# Patient Record
Sex: Male | Born: 1950
Health system: Southern US, Community
[De-identification: ages and names within clinical notes are randomized; demographics above are authoritative.]

## PROBLEM LIST (undated history)

## (undated) DIAGNOSIS — K635 Polyp of colon: Secondary | ICD-10-CM

## (undated) DIAGNOSIS — G4700 Insomnia, unspecified: Secondary | ICD-10-CM

## (undated) DIAGNOSIS — I2510 Atherosclerotic heart disease of native coronary artery without angina pectoris: Secondary | ICD-10-CM

## (undated) DIAGNOSIS — E119 Type 2 diabetes mellitus without complications: Secondary | ICD-10-CM

## (undated) DIAGNOSIS — R972 Elevated prostate specific antigen [PSA]: Secondary | ICD-10-CM

## (undated) DIAGNOSIS — J45909 Unspecified asthma, uncomplicated: Secondary | ICD-10-CM

## (undated) DIAGNOSIS — I10 Essential (primary) hypertension: Secondary | ICD-10-CM

## (undated) DIAGNOSIS — F17200 Nicotine dependence, unspecified, uncomplicated: Secondary | ICD-10-CM

## (undated) DIAGNOSIS — N259 Disorder resulting from impaired renal tubular function, unspecified: Secondary | ICD-10-CM

## (undated) DIAGNOSIS — Z9289 Personal history of other medical treatment: Secondary | ICD-10-CM

## (undated) DIAGNOSIS — I6529 Occlusion and stenosis of unspecified carotid artery: Secondary | ICD-10-CM

## (undated) DIAGNOSIS — H9319 Tinnitus, unspecified ear: Secondary | ICD-10-CM

## (undated) DIAGNOSIS — K219 Gastro-esophageal reflux disease without esophagitis: Secondary | ICD-10-CM

## (undated) DIAGNOSIS — E7800 Pure hypercholesterolemia, unspecified: Secondary | ICD-10-CM

## (undated) DIAGNOSIS — Z973 Presence of spectacles and contact lenses: Secondary | ICD-10-CM

## (undated) DIAGNOSIS — K5790 Diverticulosis of intestine, part unspecified, without perforation or abscess without bleeding: Secondary | ICD-10-CM

## (undated) HISTORY — DX: Polyp of colon: K63.5

## (undated) HISTORY — PX: CATARACT EXTRACTION W/ INTRAOCULAR LENS  IMPLANT, BILATERAL: SHX1307

## (undated) HISTORY — DX: Essential (primary) hypertension: I10

## (undated) HISTORY — DX: Occlusion and stenosis of unspecified carotid artery: I65.29

## (undated) HISTORY — PX: HERNIA REPAIR: SHX51

## (undated) HISTORY — DX: Personal history of other medical treatment: Z92.89

## (undated) HISTORY — DX: Disorder resulting from impaired renal tubular function, unspecified: N25.9

## (undated) HISTORY — DX: Diverticulosis of intestine, part unspecified, without perforation or abscess without bleeding: K57.90

## (undated) HISTORY — DX: Insomnia, unspecified: G47.00

## (undated) HISTORY — DX: Pure hypercholesterolemia, unspecified: E78.00

## (undated) HISTORY — DX: Type 2 diabetes mellitus without complications: E11.9

## (undated) HISTORY — PX: POLYPECTOMY: SHX149

## (undated) HISTORY — PX: COLONOSCOPY: SHX174

## (undated) HISTORY — PX: TONSILLECTOMY: SUR1361

## (undated) HISTORY — DX: Elevated prostate specific antigen (PSA): R97.20

## (undated) HISTORY — DX: Nicotine dependence, unspecified, uncomplicated: F17.200

---

## 2001-09-20 ENCOUNTER — Ambulatory Visit (HOSPITAL_COMMUNITY): Admission: RE | Admit: 2001-09-20 | Discharge: 2001-09-20 | Payer: Self-pay | Admitting: *Deleted

## 2002-12-12 ENCOUNTER — Encounter: Payer: Self-pay | Admitting: Gastroenterology

## 2003-09-15 ENCOUNTER — Emergency Department (HOSPITAL_COMMUNITY): Admission: EM | Admit: 2003-09-15 | Discharge: 2003-09-15 | Payer: Self-pay | Admitting: Emergency Medicine

## 2004-04-07 ENCOUNTER — Ambulatory Visit (HOSPITAL_COMMUNITY): Admission: RE | Admit: 2004-04-07 | Discharge: 2004-04-07 | Payer: Self-pay | Admitting: General Surgery

## 2004-04-07 ENCOUNTER — Encounter (INDEPENDENT_AMBULATORY_CARE_PROVIDER_SITE_OTHER): Payer: Self-pay | Admitting: *Deleted

## 2004-06-17 ENCOUNTER — Ambulatory Visit: Payer: Self-pay | Admitting: Endocrinology

## 2004-06-20 ENCOUNTER — Ambulatory Visit: Payer: Self-pay | Admitting: Endocrinology

## 2004-07-25 ENCOUNTER — Ambulatory Visit: Payer: Self-pay | Admitting: Endocrinology

## 2004-08-01 ENCOUNTER — Ambulatory Visit: Payer: Self-pay | Admitting: Endocrinology

## 2005-09-27 ENCOUNTER — Ambulatory Visit: Payer: Self-pay | Admitting: Endocrinology

## 2005-10-30 ENCOUNTER — Ambulatory Visit: Payer: Self-pay | Admitting: Endocrinology

## 2006-04-25 ENCOUNTER — Inpatient Hospital Stay (HOSPITAL_COMMUNITY): Admission: EM | Admit: 2006-04-25 | Discharge: 2006-04-27 | Payer: Self-pay | Admitting: Emergency Medicine

## 2006-04-26 ENCOUNTER — Ambulatory Visit: Payer: Self-pay | Admitting: Internal Medicine

## 2006-04-28 ENCOUNTER — Inpatient Hospital Stay (HOSPITAL_COMMUNITY): Admission: EM | Admit: 2006-04-28 | Discharge: 2006-05-07 | Payer: Self-pay | Admitting: Emergency Medicine

## 2006-04-29 ENCOUNTER — Encounter (INDEPENDENT_AMBULATORY_CARE_PROVIDER_SITE_OTHER): Payer: Self-pay | Admitting: Specialist

## 2006-05-01 ENCOUNTER — Ambulatory Visit: Payer: Self-pay | Admitting: Internal Medicine

## 2006-05-25 ENCOUNTER — Ambulatory Visit: Payer: Self-pay | Admitting: Endocrinology

## 2006-08-24 ENCOUNTER — Encounter: Payer: Self-pay | Admitting: Endocrinology

## 2006-08-24 DIAGNOSIS — I10 Essential (primary) hypertension: Secondary | ICD-10-CM

## 2006-09-24 ENCOUNTER — Ambulatory Visit: Payer: Self-pay | Admitting: Endocrinology

## 2006-09-24 LAB — CONVERTED CEMR LAB
ALT: 21 units/L (ref 0–53)
AST: 20 units/L (ref 0–37)
Basophils Relative: 0.1 % (ref 0.0–1.0)
Bilirubin, Direct: 0.1 mg/dL (ref 0.0–0.3)
CO2: 24 meq/L (ref 19–32)
Calcium: 9.2 mg/dL (ref 8.4–10.5)
Chloride: 110 meq/L (ref 96–112)
Eosinophils Absolute: 0.3 10*3/uL (ref 0.0–0.6)
Eosinophils Relative: 3.3 % (ref 0.0–5.0)
GFR calc non Af Amer: 44 mL/min
Glucose, Bld: 106 mg/dL — ABNORMAL HIGH (ref 70–99)
Hgb A1c MFr Bld: 6.7 % — ABNORMAL HIGH (ref 4.6–6.0)
Ketones, ur: NEGATIVE mg/dL
LDL Cholesterol: 120 mg/dL — ABNORMAL HIGH (ref 0–99)
Nitrite: NEGATIVE
PTH: 29.1 pg/mL (ref 14.0–72.0)
Platelets: 205 10*3/uL (ref 150–400)
RBC: 5.36 M/uL (ref 4.22–5.81)
RDW: 13.2 % (ref 11.5–14.6)
Specific Gravity, Urine: >=1.03 (ref 1.000–1.03)
TSH: 1.79 microintl units/mL (ref 0.35–5.50)
Total CHOL/HDL Ratio: 3.7
Urine Glucose: NEGATIVE mg/dL
WBC: 8.8 10*3/uL (ref 4.5–10.5)
pH: 6 (ref 5.0–8.0)

## 2006-09-25 ENCOUNTER — Ambulatory Visit: Payer: Self-pay | Admitting: Endocrinology

## 2006-09-25 DIAGNOSIS — N259 Disorder resulting from impaired renal tubular function, unspecified: Secondary | ICD-10-CM | POA: Insufficient documentation

## 2006-09-25 DIAGNOSIS — E875 Hyperkalemia: Secondary | ICD-10-CM

## 2006-09-25 DIAGNOSIS — E119 Type 2 diabetes mellitus without complications: Secondary | ICD-10-CM

## 2006-09-25 HISTORY — DX: Disorder resulting from impaired renal tubular function, unspecified: N25.9

## 2006-09-25 HISTORY — DX: Type 2 diabetes mellitus without complications: E11.9

## 2007-01-22 ENCOUNTER — Ambulatory Visit: Payer: Self-pay | Admitting: Endocrinology

## 2007-01-22 DIAGNOSIS — E7800 Pure hypercholesterolemia, unspecified: Secondary | ICD-10-CM | POA: Insufficient documentation

## 2007-01-22 LAB — CONVERTED CEMR LAB
ALT: 24 units/L (ref 0–53)
AST: 24 units/L (ref 0–37)
Albumin: 4 g/dL (ref 3.5–5.2)
Alkaline Phosphatase: 83 units/L (ref 39–117)
BUN: 20 mg/dL (ref 6–23)
Calcium: 9.4 mg/dL (ref 8.4–10.5)
Chloride: 108 meq/L (ref 96–112)
Creatinine, Ser: 1.2 mg/dL (ref 0.4–1.5)
HDL: 49.4 mg/dL (ref >39.0)
Sodium: 142 meq/L (ref 135–145)
Total Bilirubin: 0.8 mg/dL (ref 0.3–1.2)
VLDL: 22 mg/dL (ref 0–40)

## 2007-01-23 ENCOUNTER — Ambulatory Visit: Payer: Self-pay | Admitting: Endocrinology

## 2007-01-23 LAB — CONVERTED CEMR LAB
ALT: 24 units/L (ref 0–53)
Alkaline Phosphatase: 86 units/L (ref 39–117)
BUN: 19 mg/dL (ref 6–23)
Bilirubin, Direct: 0.1 mg/dL (ref 0.0–0.3)
Calcium: 9.4 mg/dL (ref 8.4–10.5)
GFR calc Af Amer: 73 mL/min
GFR calc non Af Amer: 60 mL/min
LDL Cholesterol: 119 mg/dL — ABNORMAL HIGH (ref 0–99)
Potassium: 4.2 meq/L (ref 3.5–5.1)
Total CHOL/HDL Ratio: 3.6

## 2007-02-18 ENCOUNTER — Ambulatory Visit: Payer: Self-pay | Admitting: Gastroenterology

## 2007-02-28 ENCOUNTER — Encounter: Payer: Self-pay | Admitting: Gastroenterology

## 2007-02-28 ENCOUNTER — Ambulatory Visit: Payer: Self-pay | Admitting: Gastroenterology

## 2007-03-04 ENCOUNTER — Inpatient Hospital Stay (HOSPITAL_COMMUNITY): Admission: RE | Admit: 2007-03-04 | Discharge: 2007-03-10 | Payer: Self-pay | Admitting: General Surgery

## 2007-04-29 ENCOUNTER — Encounter: Payer: Self-pay | Admitting: Endocrinology

## 2008-05-17 ENCOUNTER — Emergency Department (HOSPITAL_COMMUNITY): Admission: EM | Admit: 2008-05-17 | Discharge: 2008-05-17 | Payer: Self-pay | Admitting: Emergency Medicine

## 2008-05-18 ENCOUNTER — Ambulatory Visit: Payer: Self-pay | Admitting: Endocrinology

## 2008-05-18 DIAGNOSIS — G8190 Hemiplegia, unspecified affecting unspecified side: Secondary | ICD-10-CM | POA: Insufficient documentation

## 2008-05-20 ENCOUNTER — Encounter: Payer: Self-pay | Admitting: Endocrinology

## 2008-05-20 ENCOUNTER — Ambulatory Visit: Payer: Self-pay

## 2008-05-21 LAB — CONVERTED CEMR LAB
ALT: 23 units/L (ref 0–53)
Alkaline Phosphatase: 114 units/L (ref 39–117)
Bilirubin, Direct: 0.2 mg/dL (ref 0.0–0.3)
Total Bilirubin: 0.9 mg/dL (ref 0.3–1.2)
Total Protein: 7.2 g/dL (ref 6.0–8.3)

## 2008-06-02 ENCOUNTER — Ambulatory Visit: Payer: Self-pay | Admitting: Endocrinology

## 2008-06-02 LAB — CONVERTED CEMR LAB
AST: 29 units/L (ref 0–37)
CO2: 29 meq/L (ref 19–32)
Chloride: 108 meq/L (ref 96–112)
Creatinine, Ser: 1.7 mg/dL — ABNORMAL HIGH (ref 0.4–1.5)
HDL: 34.6 mg/dL — ABNORMAL LOW (ref >39.00)
Hgb A1c MFr Bld: 6.1 % (ref 4.6–6.5)
LDL Cholesterol: 84 mg/dL (ref 0–99)
Potassium: 4.5 meq/L (ref 3.5–5.1)
Total Bilirubin: 0.7 mg/dL (ref 0.3–1.2)
Total CHOL/HDL Ratio: 4

## 2008-09-04 ENCOUNTER — Ambulatory Visit (HOSPITAL_COMMUNITY): Admission: EM | Admit: 2008-09-04 | Discharge: 2008-09-04 | Payer: Self-pay | Admitting: Emergency Medicine

## 2008-09-10 ENCOUNTER — Ambulatory Visit: Payer: Self-pay | Admitting: Endocrinology

## 2008-09-10 DIAGNOSIS — G4700 Insomnia, unspecified: Secondary | ICD-10-CM

## 2008-09-10 HISTORY — DX: Insomnia, unspecified: G47.00

## 2009-11-22 ENCOUNTER — Ambulatory Visit: Payer: Self-pay | Admitting: Endocrinology

## 2009-11-22 LAB — CONVERTED CEMR LAB
ALT: 22 units/L (ref 0–53)
Basophils Absolute: 0.1 10*3/uL (ref 0.0–0.1)
Bilirubin Urine: NEGATIVE
CO2: 25 meq/L (ref 19–32)
Calcium: 8.9 mg/dL (ref 8.4–10.5)
Cholesterol: 236 mg/dL — ABNORMAL HIGH (ref 0–200)
Creatinine, Ser: 1.3 mg/dL (ref 0.4–1.5)
GFR calc non Af Amer: 73.96 mL/min (ref >60)
HDL: 46.6 mg/dL (ref >39.00)
Hemoglobin: 15.5 g/dL (ref 13.0–17.0)
Leukocytes, UA: NEGATIVE
Lymphocytes Relative: 18.9 % (ref 12.0–46.0)
Lymphs Abs: 1.7 10*3/uL (ref 0.7–4.0)
Microalb, Ur: 25 mg/dL — ABNORMAL HIGH (ref 0.0–1.9)
Monocytes Absolute: 1 10*3/uL (ref 0.1–1.0)
Monocytes Relative: 11.8 % (ref 3.0–12.0)
Neutrophils Relative %: 63.4 % (ref 43.0–77.0)
Nitrite: NEGATIVE
RBC: 5.45 M/uL (ref 4.22–5.81)
Sodium: 137 meq/L (ref 135–145)
TSH: 1.77 microintl units/mL (ref 0.35–5.50)
Total Bilirubin: 0.6 mg/dL (ref 0.3–1.2)
Total CHOL/HDL Ratio: 5
Triglycerides: 120 mg/dL (ref 0.0–149.0)
VLDL: 24 mg/dL (ref 0.0–40.0)
WBC: 8.8 10*3/uL (ref 4.5–10.5)
pH: 5.5 (ref 5.0–8.0)

## 2009-11-30 ENCOUNTER — Encounter: Payer: Self-pay | Admitting: Endocrinology

## 2009-11-30 ENCOUNTER — Ambulatory Visit: Payer: Self-pay | Admitting: Endocrinology

## 2009-11-30 DIAGNOSIS — R05 Cough: Secondary | ICD-10-CM

## 2009-11-30 DIAGNOSIS — R972 Elevated prostate specific antigen [PSA]: Secondary | ICD-10-CM | POA: Insufficient documentation

## 2009-11-30 DIAGNOSIS — F17200 Nicotine dependence, unspecified, uncomplicated: Secondary | ICD-10-CM

## 2009-11-30 HISTORY — DX: Nicotine dependence, unspecified, uncomplicated: F17.200

## 2009-11-30 HISTORY — DX: Elevated prostate specific antigen (PSA): R97.20

## 2010-01-04 ENCOUNTER — Telehealth (INDEPENDENT_AMBULATORY_CARE_PROVIDER_SITE_OTHER): Payer: Self-pay | Admitting: *Deleted

## 2010-02-02 ENCOUNTER — Encounter: Payer: Self-pay | Admitting: Gastroenterology

## 2010-02-08 NOTE — Procedures (Signed)
Summary: Colonoscopy   Colonoscopy  Procedure date:  02/28/2007  Findings:      Results: Hemorrhoids.     Results: Diverticulosis.       Pathology:  Adenomatous polyp.        Location:  [facility].    Procedures Next Due Date:    Colonoscopy: 03/2010 Patient Name: Shane [name], Shane [name] MRN: [id number] Procedure Procedures: Colonoscopy CPT: 54098.    with polypectomy. CPT: A3573898.    with inj. sclerosis of hemorrhoids, CPT: 46500  Personnel: Endoscopist: Venita Lick. Russella Dar, MD, Clementeen Graham.  Exam Location: Exam performed in Outpatient Clinic. Outpatient  Patient Consent: Procedure, Alternatives, Risks and Benefits discussed, consent obtained, from patient. Consent was obtained by the RN.  Indications Symptoms: Hematochezia.  History  Current Medications: Patient is not currently taking Coumadin.  Pre-Exam Physical: Performed Feb 28, 2007. Cardio-pulmonary exam, Rectal exam, HEENT exam  WNL. Abdominal exam abnormal. Mental status exam WNL. Abnormal PE findings include: ventral hernia.  Comments: Pt. history reviewed/updated, physical exam performed prior to initiation of sedation?Yes Exam Exam: Extent of exam reached: Cecum, extent intended: Cecum.  The cecum was identified by appendiceal orifice and IC valve. Time to Cecum: 00:02: 21. Time for Withdrawl: 00:16:58. Colon retroflexion performed. ASA Classification: II. Tolerance: excellent.  Monitoring: Pulse and BP monitoring, Oximetry used. Supplemental O2 given.  Colon Prep Used MoviPrep for colon prep. Prep results: excellent.  Sedation Meds: Patient assessed and found to be appropriate for moderate (conscious) sedation. Fentanyl 75 mcg. given IV. Versed 8 mg. given IV.  Findings NORMAL EXAM: Splenic Flexure.  POLYP: Transverse Colon, Maximum size: 5 mm. sessile polyp. Procedure:  snare without cautery, removed, retrieved, Polyp sent to pathology. ICD9: Colon Polyps: 211.3.  NORMAL EXAM: Ascending Colon to  Hepatic Flexure.  - DIVERTICULOSIS: Descending Colon to Sigmoid Colon. Not bleeding. ICD9: Diverticulosis: 562.10.  POLYP: Descending Colon, Maximum size: 8 mm. pedunculated polyp. Procedure:  snare with cautery, removed, retrieved, sent to pathology. ICD9: Colon Polyps: 211.3.  MULTIPLE POLYPS: Descending Colon to Sigmoid Colon. minimum size 4 mm, maximum size 5 mm. Procedure:  snare without cautery, removed, retrieved, 4 polyps Polyps sent to pathology. ICD9: Colon Polyps: 211.3.  POLYP: Cecum, Maximum size: 5 mm. pedunculated polyp. Procedure:  snare without cautery, removed, retrieved, Polyp sent to pathology. ICD9: Colon Polyps: 211.3.  HEMORRHOIDS: Internal. Size: Medium. Not bleeding. Not thrombosed. ICD9: Hemorrhoids, Internal: 455.0.  - Injection: Rectum. Injected with 23.4% saline, 3 ccs. Outcome: successful.   Assessment  Diagnoses: 211.3: Colon Polyps.  562.10: Diverticulosis.  455.0: Hemorrhoids, Internal.   Events  Unplanned Interventions: No intervention was required.  Unplanned Events: There were no complications. Plans  Post Exam Instructions: Post sedation instructions given. No aspirin or non-steroidal containing medications: 2 weeks.  Medication Plan: Await pathology.  Patient Education: Patient given standard instructions for: Polyps. Diverticulosis. Hemorrhoids.  Disposition: After procedure patient sent to recovery. After recovery patient sent home.  Scheduling/Referral: Colonoscopy, to [facility] T. Russella Dar, MD, [facility], if polyps adenomatous, around Feb 27, 2010.    This report was created from the original endoscopy report, which was reviewed and signed by the above listed endoscopist.    cc: Angelia Mould. Derrell Lolling, MD     Romero Belling, MD      Appended Document: Colonoscopy    Clinical Lists Changes  Observations: Added new observation of COLONNXTDUE: 02/2010 (11/30/2009 9:40)

## 2010-02-08 NOTE — Assessment & Plan Note (Signed)
Summary: CPX/ NWS  #   Vital Signs:  Patient profile:   60 year old male Height:      71 inches (180.34 cm) Weight:      241.13 pounds (109.60 kg) BMI:     33.75 O2 Sat:      99 % on Room air Temp:     98.2 degrees F (36.78 degrees C) oral Pulse rate:   92 / minute BP sitting:   132 / 84  (left arm) Cuff size:   large  Vitals Entered By: Brenton Grills CMA Duncan Dull) (November 30, 2009 8:40 AM)  O2 Flow:  Room air CC: Physical/aj Is Patient Diabetic? Yes   CC:  Physical/aj.  History of Present Illness: here for regular wellness examination.  He's feeling pretty well in general, and does not drink or smoke.   Current Medications (verified): 1)  Prilosec Otc 20 Mg Tbec (Omeprazole Magnesium) .[name] Kitchen.. 1 Daily 2)  Aspirin Adult Low Strength 81 Mg Tbec (Aspirin) .[name] Kitchen.. 1 Daily 3)  Hydrochlorothiazide 12.5 Mg Tabs (Hydrochlorothiazide) .[name] Kitchen.. 1 Qd 4)  Simvastatin 80 Mg Tabs (Simvastatin) .[name] Kitchen.. 1 Qhs 5)  Trazodone Hcl 150 Mg Tabs (Trazodone Hcl) .[name] Kitchen.. 1 Qhs 6)  Garlic 500 Mg Caps (Garlic) .[name] Kitchen.. 1 By Mouth Once Daily  Allergies (verified): No Known Drug Allergies  Family History: Reviewed history from 06/02/2008 and no changes required. father had "brain cancer"  Social History: Reviewed history from 06/02/2008 and no changes required. 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         he has gained a few lbs.  he has a slight dry cough  Physical Exam  General:  normal appearance.   Head:  head: no deformity eyes: no periorbital swelling, no proptosis external nose and ears are normal mouth: no lesion seen Neck:  Supple without thyroid enlargement or tenderness.  Lungs:  Clear to auscultation bilaterally.  Normal respiratory effort.  Abdomen:  abdomen is soft, nontender.  no hepatosplenomegaly.   not distended.  there is a healed midline scar, and a self-reducing midline hernia  Rectal:  normal external and internal exam.  heme neg  Prostate:  Normal size prostate without masses or tenderness.  Msk:  muscle bulk and strength are grossly normal.  no obvious joint swelling.  gait is normal and steady  Extremities:  no deformity.  no ulcer on the feet.  feet are of normal color and temp.   trace right pedal edema, trace left pedal edema mycotic toenails.   Neurologic:  cn 2-12 grossly intact.   readily moves all 4's.   sensation is intact to touch on the feet Skin:  normal texture and temp.  no rash.  not diaphoretic there are s few sebaceous cysts Cervical Nodes:  No significant adenopathy.  Psych:  Alert and cooperative; normal mood and affect; normal attention span and concentration.   Additional Exam:  SEPARATE EVALUATION FOLLOWS--EACH PROBLEM HERE IS NEW, NOT RESPONDING TO TREATMENT, OR POSES SIGNIFICANT RISK TO THE PATIENT'S HEALTH: HISTORY OF THE PRESENT ILLNESS: hyperglycemia is again noted on labs he does not take his statin rx PAST MEDICAL HISTORY reviewed and up to date today REVIEW OF SYSTEMS: denies chest pain PHYSICAL EXAMINATION: cv: rag rate and rhythm, no murmur dorsalis pedis intact bilat.  no carotid bruit  see vs page LAB/XRAY RESULTS: Cholesterol LDL    182.1 mg/dL Hemoglobin W0J       6.0 %   IMPRESSION: hyperglycemia, needs increased rx dyslipidemia, needs increased rx PLAN: see instruction sheet   Impression & Recommendations:  Problem # 1:  ROUTINE GENERAL MEDICAL EXAM@HEALTH  CARE FACL (ICD-V70.0)  Medications Added to Medication List This Visit: 1)  Hydrochlorothiazide 12.5 Mg Tabs (Hydrochlorothiazide) .[name] Kitchen.. 1 once daily 2)  Simvastatin 80 Mg Tabs (Simvastatin) .[name] Kitchen.. 1 at bedtime 3)  Trazodone Hcl 150 Mg Tabs (Trazodone hcl) .[name] Kitchen.. 1 at bedtime 4)   Garlic 500 Mg Caps (Garlic) .[name] Kitchen.. 1 by mouth once daily 5)  Chantix Starting Month Pak 0.5 Mg X 11 & 1 Mg X 42 Tabs (Varenicline tartrate) .... As dir.  refill with continuing packs 6)  Metformin Hcl 500 Mg Xr24h-tab (Metformin hcl) .[name] Kitchen.. 1 tab once daily  Other Orders: Flu Vaccine 9yrs + (81191) Admin 1st Vaccine (47829) Pneumococcal Vaccine (56213) Admin of Any Addtl Vaccine (08657) EKG w/ Interpretation (93000) T-2 View CXR (71020TC) Est. Patient Level III (84696) Est. Patient 40-64 years (29528)  Patient Instructions: 1)  i sent prescription for simvastatin to [facility] on [address] 2)  i sent other prescriptions to [facility]. 3)  check chest x-ray.  please call [phone] to hear your test results. 4)  please consider these measures for your health:  minimize alcohol.  do not use tobacco products.  have a colonoscopy at least every 10 years from age 24.  keep firearms safely stored.  always use seat belts.  have working smoke alarms in your home.  see an eye doctor and dentist regularly.  never drive under the influence of alcohol or drugs (including prescription drugs).  5)  please let me know what your wishes would be, if artificial life support measures should become necessary.  it is critically important to prevent falling down (keep floor areas well-lit, dry, and free of loose objects). 6)  Please schedule a follow-up appointment in 1 year. 7)  take metformin-xr 500 mg each am, to keep your blood sugar down. Prescriptions: METFORMIN HCL 500 MG XR24H-TAB (METFORMIN HCL) 1 tab once daily  #90 x 3   Entered and Authorized by:   Minus Breeding MD   Signed by:   Minus Breeding MD on 11/30/2009   Method used:   Electronically to        [facility] (retail)       [address]       [place], [place]  [address]       Ph: [phone]       Fax: [phone]   RxID:   [id number] TRAZODONE HCL 150 MG TABS (TRAZODONE HCL) 1 qhs  #90 x 3    Entered and Authorized by:   Minus Breeding MD   Signed by:   Minus Breeding MD on 11/30/2009   Method used:   Electronically to        [facility] (retail)       [address]       [place], [place]  [address]       Ph: [phone]       Fax: [phone]   RxID:   [id number] HYDROCHLOROTHIAZIDE 12.5 MG TABS (HYDROCHLOROTHIAZIDE) 1 qd  #90 x 3   Entered and Authorized by:   Minus Breeding MD  Signed by:   Minus Breeding MD on 11/30/2009   Method used:   Electronically to        [facility] (retail)       [address]       [place], [place]  [address]       Ph: [phone]       Fax: [phone]   RxID:   [id number] CHANTIX STARTING MONTH PAK 0.5 MG X 11 & 1 MG X 42 TABS (VARENICLINE TARTRATE) as dir.  refill with continuing packs  #1 pack x 11   Entered and Authorized by:   Minus Breeding MD   Signed by:   Minus Breeding MD on 11/30/2009   Method used:   Electronically to        [facility] (retail)       [address]       [place], [place]  [address]       Ph: [phone]       Fax: [phone]   RxID:   [id number] SIMVASTATIN 80 MG TABS (SIMVASTATIN) 1 qhs  #90 x 3   Entered and Authorized by:   Minus Breeding MD   Signed by:   Minus Breeding MD on 11/30/2009   Method used:   Electronically to        [facility] (retail)       [address]       [place], [place]  [address]       Ph: [phone]       Fax: [phone]   RxID:   [id number]    Orders Added: 1)  Flu Vaccine 71yrs + [07371] 2)  Admin 1st Vaccine [06269] 3)  Pneumococcal Vaccine [48546] 4)  Admin of Any Addtl Vaccine [90472] 5)  EKG w/ Interpretation [93000] 6)  T-2 View CXR [71020TC] 7)  Est. Patient Level III [27035] 8)  Est. Patient 40-64 years [00938]   Immunizations Administered:  Influenza Vaccine # 1:    Vaccine Type: Fluvax 3+    Site: left deltoid     Mfr: Sanofi Pasteur    Dose: 0.5 ml    Route: IM    Given by: Brenton Grills CMA (AAMA)    Exp. Date: 07/09/2010    Lot #: [id number]    VIS given: 2011  Pneumonia Vaccine:    Vaccine Type: Pneumovax    Site: right deltoid    Mfr: Merck    Dose: 0.5 ml    Route: IM    Given by: Brenton Grills CMA (AAMA)    Exp. Date: 05/06/2011    Lot #: [id number]    VIS given: 12/14/08 version given November 30, 2009.  Flu Vaccine Consent Questions:    Do you have a history of severe allergic reactions to this vaccine? no    Any prior history of allergic reactions to egg and/or gelatin? no    Do you have a sensitivity to the preservative Thimersol? no    Do you have a past history of Guillan-Barre Syndrome? no    Do you currently have an acute febrile illness? no    Have you ever had a severe reaction to latex? no    Vaccine information given and explained to patient? yes   Immunizations Administered:  Influenza Vaccine # 1:    Vaccine Type: Fluvax 3+  Site: left deltoid    Mfr: Sanofi Pasteur    Dose: 0.5 ml    Route: IM    Given by: Brenton Grills CMA (AAMA)    Exp. Date: 07/09/2010    Lot #: [id number]    VIS given: 2011  Pneumonia Vaccine:    Vaccine Type: Pneumovax    Site: right deltoid    Mfr: Merck    Dose: 0.5 ml    Route: IM    Given by: Brenton Grills CMA (AAMA)    Exp. Date: 05/06/2011    Lot #: [id number]    VIS given: 12/14/08 version given November 30, 2009.   Preventive Care Screening  Last Pneumovax:    Date:  11/30/2009    Results:  Pneumovax  Last Flu Shot:    Date:  11/30/2009    Results:  Fluvax 3+   Colonoscopy:    Date:  02/28/2007    Results:  Adenomatous Polyp    Preventive Care Screening  Last Pneumovax:    Date:  11/30/2009    Results:  Pneumovax  Last Flu Shot:    Date:  11/30/2009    Results:  Fluvax 3+   Colonoscopy:    Date:  02/28/2007    Results:  Adenomatous Polyp

## 2010-02-10 NOTE — Progress Notes (Signed)
  Phone Note Other Incoming   Request: Send information Summary of Call: Request for records received from [facility]. Request forwarded to [facility].     

## 2010-02-10 NOTE — Letter (Signed)
Summary: Colonoscopy Letter  [facility]  [address] [place], [place] [address]   Phone: [phone]  Fax: [phone]      February 02, 2010 MRN: [id number]   [facility] [address] [place], [place]  [address]   Dear Shane [name],   According to your medical record, it is time for you to schedule a Colonoscopy. The American Cancer Society recommends this procedure as a method to detect early colon cancer. Patients with a family history of colon cancer, or a personal history of colon polyps or inflammatory bowel disease are at increased risk.  This letter has been generated based on the recommendations made at the time of your procedure. If you feel that in your particular situation this may no longer apply, please contact our office.  Please call our office at [phone] to schedule this appointment or to update your records at your earliest convenience.  Thank you for cooperating with [place] to provide you with the very best care possible.   Sincerely,  Judie Petit T. Russella Dar, M.D.  [facility] Gastroenterology Division [phone]

## 2010-02-16 ENCOUNTER — Encounter (INDEPENDENT_AMBULATORY_CARE_PROVIDER_SITE_OTHER): Payer: Self-pay | Admitting: *Deleted

## 2010-02-24 NOTE — Letter (Signed)
Summary: Pre Visit Letter Revised  [facility]  [address] [place], [place] [address]   Phone: [phone]  Fax: [phone]        02/16/2010 MRN: [id number] [facility] [address] [place], [place]  [address]             Procedure Date:  03-29-10   Welcome to the Gastroenterology Division at [facility].    You are scheduled to see a nurse for your pre-procedure visit on 03-15-10 at 8:00A.M.  on the 3rd floor at [facility], [address]  We ask that you try to arrive at our office 15 minutes prior to your appointment time to allow for check-in.  Please take a minute to review the attached form.  If you answer "Yes" to one or more of the questions on the first page, we ask that you call the person listed at your earliest opportunity.  If you answer "No" to all of the questions, please complete the rest of the form and bring it to your appointment.    Your nurse visit will consist of discussing your medical and surgical history, your immediate family medical history, and your medications.   If you are unable to list all of your medications on the form, please bring the medication bottles to your appointment and we will list them.  We will need to be aware of both prescribed and over the counter drugs.  We will need to know exact dosage information as well.    Please be prepared to read and sign documents such as consent forms, a financial agreement, and acknowledgement forms.  If necessary, and with your consent, a friend or relative is welcome to sit-in on the nurse visit with you.  Please bring your insurance card so that we may make a copy of it.  If your insurance requires a referral to see a specialist, please bring your referral form from your primary care physician.  No co-pay is required for this nurse visit.     If you cannot keep your appointment, please call [phone] to cancel or reschedule prior to your appointment date.  This allows [place]  the opportunity to schedule an appointment for another patient in need of care.    Thank you for choosing [facility] for your medical needs.  We appreciate the opportunity to care for you.  Please visit [place] at our website  to learn more about our practice.  Sincerely, The Gastroenterology Division

## 2010-03-29 ENCOUNTER — Other Ambulatory Visit: Payer: Self-pay | Admitting: Gastroenterology

## 2010-04-16 LAB — POCT I-STAT, CHEM 8
BUN: 24 mg/dL — ABNORMAL HIGH (ref 6–23)
Calcium, Ion: 1.19 mmol/L (ref 1.12–1.32)
Chloride: 112 mEq/L (ref 96–112)
Creatinine, Ser: 1.8 mg/dL — ABNORMAL HIGH (ref 0.4–1.5)
Glucose, Bld: 100 mg/dL — ABNORMAL HIGH (ref 70–99)
HCT: 45 % (ref 39.0–52.0)
Hemoglobin: 15.3 g/dL (ref 13.0–17.0)
Potassium: 4.4 mEq/L (ref 3.5–5.1)
Sodium: 141 mEq/L (ref 135–145)
TCO2: 19 mmol/L (ref 0–100)

## 2010-04-19 LAB — CBC
Hemoglobin: 14.4 g/dL (ref 13.0–17.0)
MCHC: 33.9 g/dL (ref 30.0–36.0)
MCV: 83.9 fL (ref 78.0–100.0)
RBC: 5.07 MIL/uL (ref 4.22–5.81)

## 2010-04-19 LAB — BASIC METABOLIC PANEL
CO2: 18 mEq/L — ABNORMAL LOW (ref 19–32)
Chloride: 112 mEq/L (ref 96–112)
GFR calc Af Amer: >60 mL/min (ref >60)
Sodium: 139 mEq/L (ref 135–145)

## 2010-04-19 LAB — URINE MICROSCOPIC-ADD ON

## 2010-04-19 LAB — URINALYSIS, ROUTINE W REFLEX MICROSCOPIC
Glucose, UA: NEGATIVE mg/dL
Hgb urine dipstick: NEGATIVE
Specific Gravity, Urine: 1.021 (ref 1.005–1.030)
pH: 5.5 (ref 5.0–8.0)

## 2010-05-24 NOTE — Op Note (Signed)
NAMEDANNER, PAULDING NO.:  [id number]   MEDICAL RECORD NO.:  [id number]          PATIENT TYPE:  EMS   LOCATION:  ED                           FACILITY:  [facility]   PHYSICIAN:  Madelynn Done, MD  DATE OF BIRTH:  [birth year]   DATE OF PROCEDURE:  09/04/2008  DATE OF DISCHARGE:                               OPERATIVE REPORT   PREOPERATIVE DIAGNOSIS:  Left index finger puncture wound with retained  foreign body.   POSTOPERATIVE DIAGNOSIS:  Left index finger puncture wound with retained  foreign body.   OPERATION PERFORMED:  1. Left index finger; removal of foreign body, retained nail.  2. Left index finger flexor tendon sheath exploration and tendon      sheath drainage.   1. Primary extensor repair, left index finger.   SURGEON:  Madelynn Done, M.D.; who was scrubbed and present for the  entire procedure.   ASSISTANT SURGEON:  None.   ANESTHESIA:  General via endotracheal tube.   TOURNIQUET TIME:  Tourniquet time was 20 minutes at 250 mmHg.   INTRAOPERATIVE FINDINGS:  The patient did have a retained foreign body.  The patient did have a large longitudinal laceration in the extensor  mechanism dorsally directly over zone four.  The patient's flexor tendon  sheath was in continuity.  There was a partial laceration of both the  FDS and FDP with very minimal splits in the tendon.   DESCRIPTION OF THE OPERATION:  The patient was appropriately identified  in the preop holding area and mark with a permanent marker was made on  the left index finger to indicate correct the operative site.  The  patient was then brought back to the operating and was placed supine on  the operating table where general anesthesia was administered.  The  patient tolerated this well.  Well padded tourniquet was then placed on  the left brachium and sealed with a 1,000 drape.  Left upper extremity  was then prepped with Betadine and sterilely draped.  Time-out was  called, the  correct site was identified and this procedure was then  begun.   Attention was then turned to the foreign body were the nail was bluntly  tap from a dorsal to the ventral direction and the nail was removed.  The limb was then elevated, and using an Esmarch exsanguination the  tourniquet was insufflated.  A small Brunner type incision was then made  volarly where the nail had entered.  Dissection was then carried down  through the skin and subcutaneous tissue directly over the PIP crease  down to the A3 pulley.  Dissection was carried down in the flexor tendon  sheath.  The flexor tendon sheath was explored.  There were small  lacerations to the FDS and FDP, but not felt necessary for repair.  The  flexor tendon sheath was then opened and copious irrigation was then  done in the flexor tendon sheath.   After exploration and drainage of the flexor tendon sheath the wound was  then thoroughly irrigated.  The wound was then closed  with 4-0 nylon  simple sutures.  Attention was then turned dorsally where the exit wound  was then opened longitudinally.  Dissection was carried down to the  extensor mechanism.  The patient did have a large longitudinal split  within the extensor mechanism; greater than 50% of the tendon width.  Following this a running 4-0 Ethibond suture was then used to repair the  extensor tendon with good closure of the defect.  Following this the  wound was then thoroughly irrigated.  The skin was then closed with 4-0  nylon horizontal mattress and simple sutures.  A light bulky Xeroform  dressing was then applied.  A sterile compressive bandage was then  applied.  The patient then placed in a finger splint keeping the finger in  extension.  He was then extubated and taken to the recovery room in good  condition.   POSTOPERATIVE PLAN:  The patient will be discharged to home and seen  back to the office in 9 days for wound check, suture removal and then a  therapy  evaluation for a small finger splint, and then begin gradual use  and activity with the hand.      Madelynn Done, MD  Electronically Signed     FWO/MEDQ  D:  09/04/2008  T:  09/05/2008  Job:  [id number]

## 2010-05-24 NOTE — Discharge Summary (Signed)
Shane [name], Shane [name]                 ACCOUNT NO.:  [id number]   MEDICAL RECORD NO.:  [id number]          PATIENT TYPE:  INP   LOCATION:  1535                         [facility]ACILITY:  [facility]   PHYSICIAN:  Lennie Muckle, MD      DATE OF BIRTH:  1950-08-31   DATE OF ADMISSION:  03/04/2007  DATE OF DISCHARGE:  03/10/2007                               DISCHARGE SUMMARY   DIAGNOSIS:  Ventral hernia.   PROCEDURE:  Laparoscopic ventral hernia repair on March 04, 2007.   Shane [name] is a 60 year old male who had a laparoscopic ventral hernia  repair by Angelia Mould. Derrell Lolling, M.D., on March 04, 2007.  Postoperatively he had abdominal distention, nausea and vomiting and was  believed to have a postoperative ileus.  He did have imaging performed  during his hospitalization due to concern of a possible missed  enterotomy and/or small bowel obstruction.  CT on March 06, 2007,  revealed no abnormality.  There was a fluid collection to be noted at  the umbilicus, which was likely a seroma.  He had a follow-up CT  performed on March 08, 2007, which showed he continued to have some  dilation of small bowel loops; however, he was able to progress to  passing flatus and having bowel movements, had his diet advanced to a  regular diet, which he is tolerating well on day of discharge.  His  abdomen is distended, however nontender.  A seroma is present at the  umbilical region; however, this is expected given his surgery.  He will  be discharged today and will follow up with Dr. Derrell Lolling in approximately  2 or 3 weeks, will call if he developed any problems in the interim.   He will be discharged with medications of:  1. Tylox one to two every 4 hours for pain.  2. Daily stool softeners.   He is instructed to perform no heavy lifting until follow-up with Dr.  Derrell Lolling.  He can shower and bathe and is to resume all of his home  medications.      Lennie Muckle, MD  Electronically Signed     ALA/MEDQ   D:  03/10/2007  T:  03/11/2007  Job:  [id number]   cc:   Gregary Signs A. Everardo All, MD  [address]  [place]  [place] [address]   Angelia Mould. Derrell Lolling, M.D.  [address]  [place]  [place] [address]

## 2010-05-24 NOTE — Op Note (Signed)
NAMEDALLIN, MCCORKEL NO.:  [id number]   MEDICAL RECORD NO.:  [id number]          PATIENT TYPE:  INP   LOCATION:  0004                         FACILITY:  [facility]   PHYSICIAN:  Angelia Mould. Derrell Lolling, M.D.DATE OF BIRTH:  February 09, 1950   DATE OF PROCEDURE:  03/04/2007  DATE OF DISCHARGE:                               OPERATIVE REPORT   PREOPERATIVE DIAGNOSIS:  Recurrent ventral hernia.   POSTOPERATIVE DIAGNOSIS:  Recurrent, incarcerated ventral hernia.   OPERATION PERFORMED:  Laparoscopic repair of recurrent, incarcerated  ventral hernia with Proceed mesh (20 cm x 25 cm), lysis of adhesions.   OPERATIVE INDICATIONS:  This is a 60 year old man who underwent  bilateral inguinal hernia repairs and an umbilical hernia repair with  mesh 3-4 years ago.  He presented to me last year with a small bowel  obstruction and underwent laparotomy and was found to have a loop of  small bowel adherent to and in fact, grown into the mesh necessitating a  small bowel resection and resection of the mesh from his abdominal wall.  He did get a localized wound infection and then he healed and did well.  He came back to see me one month ago with a ventral hernia.  On exam he  had about a 7 cm defect.  Quite protuberant but it appeared that I could  reduce most of the intestine back into the abdominal cavity.  He wanted  to have this repaired at this time and is brought to the operating room  electively.   OPERATIVE FINDINGS:  The patient had about 7 or 8 cm diameter circular  defect in the midline just above the umbilicus extending up close to but  not involving the falciform ligament.  There was a lot of omentum  incarcerated in this which required a fairly extensive lysis of  adhesions.  I did not see any loops of small bowel or colon up in the  hernia, however.   OPERATIVE TECHNIQUE:  Following the induction of general endotracheal  anesthesia, a Foley catheter was inserted.  Intravenous  antibiotics were  given.  The patient's abdomen was prepped and draped in a sterile  fashion.  A 5-mm optical port was placed in the left upper quadrant  under direct vision and that insertion was uneventful.  Pneumoperitoneum  was created.  Visualization carried out with findings as described  above.  I placed a 5-mm trocar in the left lower quadrant and two 5 mm  trocars on the right lateral abdomen.  I ultimately exchanged the trocar  in the left upper quadrant for a 12 mm trocar.   Using blunt dissection, and the harmonic scalpel to take all the  adhesions down out of the hernia sac.  Some blunt dissection was  effective in taking some of the adhesions down.  Some sharp dissection  was required and the harmonic scalpel was useful in hemostasis.  I also  took down the falciform ligament using the harmonic scalpel.  We did not  lose very much blood in all.  After all this was released, we checked  around the abdomen and looked at all of the colon and small bowels and  found no injury.   We then used a spinal needle, passing it through the abdominal wall to  measure the edges of the mesh.  After this was marked, I used a ruler  and marking pen to mark about 4-5 cm outside of this and I had about a  20 x 21 cm area so I used a 20 x 25 cm piece of Proceed mesh.  That was  brought to operative field.  We released the pneumoperitoneum.  We  marked a template on the abdominal wall with a marking pen for eight  individual suture fixation sites.  We reinsufflated the abdomen.  I  placed eight individual sutures of 0-0 Novofil on the edges of the mesh  being careful to place the sutures and the knots on the rough external  side that had the blue marking.  The [employer] rep was present during the  case.  After all eight suture fixation sites were placed, we rolled the  mesh up and inserted it into the abdominal cavity.  We then unrolled  mesh and positioned it according to the template.  We then  used the  Endoclose suture passer to draw all the sutures through the abdominal  wall through small stab wounds at the eight points of suture fixation.  We were careful to take at least a 1 cm bite of fascia to ensure good  fixation.  After all these were placed, we lifted the mesh up and the  positioning of the mesh was good.  There was very little redundancy of  the mesh and it spread out quite nicely without tension.  We tied all of  the knots.  We buried all the knots.  We used a 5 mm screw tacker to  secure the mesh.  I used about 60 tacks to do this.  First I placed  tacks all the way around the perimeter of the mesh no more than 1 cm  apart and then I placed about eight tacks centrally to secure the mesh  to the abdominal wall more centrally and this looked good all the way  around.  We inspected this from multiple angles right and left with the  camera and it looked like we had not left anyplace not secured.  There  was no bleeding.  The trocars were removed under direct vision.  There  is no bleeding from trocar sites.  Pneumoperitoneum released.  All of  the incisions were closed with subcuticular sutures of 4-0 Monocryl and  all the sutures were closed with Dermabond.  Clean bandages were placed  and the patient taken to recovery in stable condition.  Estimated blood  loss about 25 mL.  Complications none.  Sponge, needle and instrument  counts were correct.      Angelia Mould. Derrell Lolling, M.D.  Electronically Signed     HMI/MEDQ  D:  03/04/2007  T:  03/04/2007  Job:  [id number]   cc:   Gregary Signs A. Everardo All, MD  [address]  [place]  [place] [address]

## 2010-05-24 NOTE — Assessment & Plan Note (Signed)
[facility]                         GASTROENTEROLOGY OFFICE NOTE   NAME:REESEAuguste, Tebbetts                          MRN:          [id number]  DATE:02/18/2007                            DOB:          1950/11/21    REFERRING PHYSICIAN:  Gregary Signs A. Everardo All, MD   REASON FOR CONSULTATION:  For colon cancer screening.   HISTORY:  Mr. Dimmick is a pleasant 60 year old African/American male who  had undergone a prior colonoscopy with Dr. Dortha Kern in 2003.  He was  found to have diverticulosis anddid not have any colon polyps noted at  that time.  He had external hemorrhoids as well.  He underwent an upper  endoscopy with Dr. Judie Petit T. Russella Dar in 2004, for evaluation of reflux  symptoms and to rule out Barrett's.  There was a question of Barrett's  and a 3 cm hiatal hernia; however, the biopsies did not show any  evidence of Barrett's.   The patient says that he had a small bowel obstruction in April 2008,  for which he underwent a lysis of adhesions and small bowel resection.  He now has a recurrent ventral incisional hernia which is to be repaired  by Dr. Angelia Mould. Derrell Lolling on March 04, 2007.  The patient currently  has no lower GI complaints, though he says that he has seen small  amounts of bright red blood per rectum off and on over the past couple  of years.  He has not noted any changes in his bowel habits.  No  abdominal pain.  He has chronic acid reflux, for which he is taking  Nexium, and says that this works very well.  He has no complaints of  dysphagia or odynophagia.   CURRENT MEDICATIONS:  1. Crestor 40 mg daily.  2. Nexium 40 mg daily.  3. Toprol ER 50 mg daily.   ALLERGIES:  No known drug allergies.   PAST MEDICAL/SURGICAL HISTORY:  1. ventral hernia repair x2.  2. small bowel obstruction with lysis of adhesions and small bowel      resection in 2008.  3. Hypertension.  4. Adult onset diabetes, diet-controlled.  5. Hyperlipidemia.  6.  Diverticulosis  7. GERD   FAMILY HISTORY:  Negative for colon cancer and polyps.  Positive for  alcoholism in his mother.   SOCIAL HISTORY:  The patient is married.  He has two children.  He is  employed as a Data processing manager.  He is a smoker.  He drinks alcohol  socially.   REVIEW OF SYSTEMS:  GI:  As outlined above.  He does have some problems  with insomnia and excessive urination.  Otherwise complete review of  systems is negative.   PHYSICAL EXAMINATION:  GENERAL:  A well-developed African/American male,  in no acute distress.  VITAL SIGNS:  Height 5 feet 11 inches, weight 220 pounds.  HEENT:  Normocephalic and atraumatic.  EOMI.  Pupils equal, round,  reactive to light and accommodation.  Sclerae anicteric.  NECK:  Supple.  CARDIOVASCULAR:  A regular rate and rhythm with S1 and S2.  No murmur,  rub  or gallop.  PULMONARY:  Clear to A&P.  ABDOMEN:  Soft, bowel sounds active.  He does have an umbilical hernia  which protrudes but is soft and reducible.  RECTAL:  Not done today. Deferred to time of colonoscopy.   IMPRESSION:  9. A 60 year old African/American male with intermittent rectal      bleeding and a history of diverticular disease, rule out colorectal      neoplasms, hemorrhoids and other disorders.  2. Patient scheduled for ventral hernia repair later this month.  3. History of small bowel obstruction, status post lysis of adhesions      and resection in April 2008.  4. Gastroesophageal reflux disease, stable, with no evidence for      Barrett's on previous biopsies.   PLAN:  Schedule colonoscopy at the patient's convenience pior to his  planned surgery later this month. Risks, benefits and alternatives to  colonoscopy with possible biopsy, possible polypectomy and possible  destruction of internal hemorrhoids discussed with the patient and he  consents to proceed.      [name], PA-C  Electronically Signed      Venita Lick. Russella Dar, MD, [facility]   Electronically Signed   AE/MedQ  DD: 02/18/2007  DT: 02/19/2007  Job #: [id number]   cc:   Angelia Mould. Derrell Lolling, M.D.  Sean A. Everardo All, MD

## 2010-05-27 NOTE — H&P (Signed)
Shane [name], Shane [name] NO.:  [id number]   MEDICAL RECORD NO.:  [id number]          PATIENT TYPE:  INP   LOCATION:  1606                         FACILITY:  [facility]   PHYSICIAN:  Gordy Savers, MDDATE OF BIRTH:  [birth year]   DATE OF ADMISSION:  04/28/2006  DATE OF DISCHARGE:                              HISTORY & PHYSICAL   CHIEF COMPLAINT:  Abdominal pain, intractable nausea and vomiting.   PRESENT ILLNESS:  The patient is a 60 year old African-American male who  was discharged from hospital 1 day ago after treatment for a small-bowel  obstruction.  He did well on the day of discharge, had several bowel  movements and tolerated lunch without difficulty, several hours after  arriving home he had the onset of recurrent emesis.  On the day of  admission had worsening abdominal distension with significant pain and  intractable nausea and vomiting.  He presented to the emergency  department for evaluation where radiographic studies revealed  significant interval worsening and abnormally dilated small loops of  bowel.  This was consistent with a high-grade obstruction.  White count  was elevated at 12.9.  Chemistries were unremarkable.  The patient now  admitted for further evaluation and treatment of recurrent small bowel  obstruction.   PAST MEDICAL HISTORY:  The patient was operated on for an incarcerated  umbilical hernia and had an incidental right inguinal hernia repair in  2005.  He has also had a more remote left inguinal hernia repair.  He  has hypertension, gastroesophageal reflux disease and a history of  dyslipidemia.  Other surgical procedures have included cataract surgery  involving the right eye and a penile implant.  He had colonoscopy in  2003 by Dr. Tad Moore and diverticulosis was diagnosed at that time.  He has  a history of ongoing tobacco use.  Medical regimen includes Nexium,  Crestor and two unknown antihypertensive medications.  Please see  chart  for further details of his past medical history.   EXAMINATION:  GENERAL:  Revealed a well-developed, mildly overweight  black male in no acute distress.  VITAL SIGNS:  Pulse was 90, blood pressure 122/78, respiratory rate 20,  temperature 100.2.  SKIN:  Warm and dry without rash.  HEAD AND NECK:  Revealed sclerae to be anicteric.  Ear, nose and throat  unremarkable.  Upper dentures in place.  Mucous membranes appeared well-  hydrated.  Neck: No bruits or neck vein distension.  CHEST:  Was clear.  CARDIOVASCULAR EXAM:  Revealed normal S1-S2, no murmurs or gallops.  ABDOMEN:  Was markedly distended.  There is some slight tenderness in  the left lower quadrant.  Bowel sounds were present and high pitched.  EXTREMITIES:  Revealed no edema.  Peripheral pulses intact.  Small  surgical scars were noted in both groin areas as well as the umbilical  area.   IMPRESSION:  Recurrent high-grade small-bowel obstruction.   ADDITIONAL DIAGNOSES:  Hypertension, gastroesophageal reflux disease,  dyslipidemia, history of stress, hyperglycemia and acute renal failure  during his prior hospital admission.   DISPOSITION:  The patient will be readmitted  to hospital.  He will be  supported with IV fluids and made n.p.o., a nasogastric tube will be  placed for intermittent suctioning, serial electrolytes will be  monitored, a surgical consult be obtained.      Gordy Savers, MD  Electronically Signed     PFK/MEDQ  D:  04/28/2006  T:  04/29/2006  Job:  [id number]

## 2010-05-27 NOTE — Consult Note (Signed)
NAMEFRANCES, JOYNT NO.:  [id number]   MEDICAL RECORD NO.:  [id number]          PATIENT TYPE:  INP   LOCATION:  1606                         FACILITY:  [facility]   PHYSICIAN:  Angelia Mould. Derrell Lolling, M.D.DATE OF BIRTH:  12/25/1950   DATE OF CONSULTATION:  04/29/2006  DATE OF DISCHARGE:                                 CONSULTATION   REASON FOR CONSULTATION:  Evaluate recurrent small-bowel obstruction.   HISTORY OF PRESENT ILLNESS:  This is a 60 year old black man who was  hospitalized at [facility] from April 15 through April 19 with  a partial small-bowel obstruction.  Clinically, the bowel obstruction  resolved and he was discharged home.  Yesterday, he became significantly  distended and vomited repeatedly and came back to the emergency room and  was readmitted.  X-rays that were obtained yesterday show an high-grade  distal small bowel obstruction.   A nasogastric tube was placed and he feels better today, but is still  distended.  His last bowel movement was yesterday, but he did pass some  flatus today.  His NG drainage looks fairly thin.   He states that he has never had any gastrointestinal problems prior to  this.  He had a colonoscopy in 2003 which showed diverticulosis and has  not had any other abdominal surgery.   PAST HISTORY:  1. Colonoscopy, 2003, by Dr. Dortha Kern showing only diverticulosis.  2. He has gastroesophageal reflux disease and takes proton pump      inhibitors.  3. He apparently has had an upper endoscopy many years ago which did      not show anything significant.  4. He has hypertension.  5. He underwent umbilical herniorrhaphy and inguinal herniorrhaphy by      Dr. Lurene Shadow a 2 or 3 years ago.  6. He has hyperlipidemia.  7. He has had cataract surgery on the right.  8. He has had a penile implant.   CURRENT MEDICATIONS:  1. Nexium 40 mg a day.  2. Crestor.  3. He takes 2 blood pressure pills, but he does not know the  names or      the doses.   DRUG ALLERGIES:  None known.   FAMILY HISTORY:  Father had central nervous system cancer.  Mother had  hypertension.  There is no family history of stroke, heart attack or  diabetes.   SOCIAL HISTORY:  He smokes a pack of cigarettes per day.  He drinks  occasionally.  He is married.   REVIEW OF SYSTEMS:  Fifteen system review of systems is performed and is  noncontributory except as described above.   PHYSICAL EXAM:  GENERAL:  Pleasant middle-aged black man in no distress.  VITAL SIGNS:  Temperature 97.1, heart rate 80 and regular, respiratory  rate 20, blood pressure 115/70.  HEENT:  Eyes:  Sclerae are clear.  Extraocular movements intact.  Ears,  nose, mouth and throat:  Nose, lips, tongue and oropharynx are without  gross lesions.  He has an upper dental plate and lower partial.  NECK:  Supple, nontender.  No mass.  No adenopathy.  No jugular  distention.  LUNGS:  Clear to auscultation.  No chest wall tenderness.  HEART:  Regular rate and rhythm.  No murmurs or rubs.  Radial and  femoral pulses are palpable.  ABDOMEN:  Distended.  Soft.  Nontender.  Tympanitic.  He has normal to  hyperactive bowel sounds with some high-pitched bowel sounds.  He has a  well-healed infraumbilical incision.  He has well-healed inguinal  incisions.  There is no mass.  There is no sign of any recurrent hernia.  EXTREMITIES:  He moves all 4 extremities well without pain or deformity.  NEUROLOGIC:  No gross motor or sensory deficits.   ADMISSION DATA:  Abdominal x-rays yesterday suggest a high-grade distal  small bowel obstruction.  Hemoglobin 12.3, white blood cell count 7600.  Sodium 136, potassium 3.7, BUN 20, glucose 119.   ASSESSMENT:  1. Recurrent small bowel obstruction.  There is no evidence of      compromised bowel at this time, but I suspect that he will probably      need a laparotomy to resolve this problem.  Differential diagnosis      includes adhesions,  internal hernia, and neoplasia and this has      been discussed with the patient.  2. Status post umbilical hernia repair and bilateral inguinal hernia      repairs.  3. Status post penile implant.  4. History of diverticulosis on colonoscopy, 2003.  5. Hypertension.  6. Hyperlipidemia.   PLAN:  1. We are going to go ahead and get another set of x-rays now and I      will review those and discuss those with the patient later today.      He may require laparotomy later today.      Angelia Mould. Derrell Lolling, M.D.  Electronically Signed     HMI/MEDQ  D:  04/29/2006  T:  04/30/2006  Job:  [id number]   cc:   Rosalyn Gess. Norins, MD  [address]  [place]  [place] [address]   Gregary Signs A. Everardo All, MD  [address]  [place]  [place] [address]

## 2010-05-27 NOTE — Op Note (Signed)
Shane [name], Shane [name] NO.:  [id number]   MEDICAL RECORD NO.:  [id number]          PATIENT TYPE:  INP   LOCATION:  1606                         FACILITY:  [facility]   PHYSICIAN:  Angelia Mould. Derrell Lolling, M.D.DATE OF BIRTH:  Jun 17, 1950   DATE OF PROCEDURE:  04/29/2006  DATE OF DISCHARGE:                               OPERATIVE REPORT   PREOPERATIVE DIAGNOSIS:  Acute small bowel obstruction.   POSTOPERATIVE DIAGNOSIS:  Acute mechanical small-bowel obstruction  secondary to small bowel adhesion involving the periumbilical mesh of  the abdominal wall.   OPERATION PERFORMED:  1. Exploratory laparotomy with lysis of adhesions.  2. Small bowel resection.  3. Resection of abdominal wall mass(mesh).  4. Primary reconstruction of abdominal wall.   SURGEON:  Claud Kelp, MD   FIRST ASSISTANT:  Luretha Murphy, MD   OPERATIVE INDICATIONS:  This is a 60 year old white male who has had 2  hospital admissions in the last week for abdominal distention and  mechanical small-bowel obstruction.  I was asked to see him today and  found that he had a high-grade mechanical small-bowel obstruction and I  did not think that this would resolve and he was operated upon urgently.  He has a history of umbilical hernia repair and the inguinal hernia  repair in the past, but no other prior abdominal surgery.   OPERATIVE FINDINGS:  There was a small bowel obstruction secondary to a  single loop of small bowel adherent to some synthetic mesh which was  projecting below the umbilicus.  The mesh had clearly grown into the  small bowel loop.  There was some omentum caught in this area as well.  There was no hernia in this area, however.  There was no evidence of any  perforation or abscess.  I did not find any other adhesions.  I did not  feel any tumors anywhere.  The areas of his inguinal hernia repairs felt  normal internally; I felt no adhesions there.   OPERATIVE TECHNIQUE:  Following the  induction of general endotracheal  anesthesia, intravenous antibiotics were given prior to the incision.  A  Foley catheter was inserted and nasogastric tube was positioned.  The  abdomen was prepped and draped in a sterile fashion.   A midline laparotomy was performed.  The abdomen was entered superiorly  and the fascia was incised in the midline and the abdomen explored with  findings as described above.   Using Kocher clamps on the fascia, I retracted the fascia and with a  knife, I resected the mesh and the small bowel loop from the abdominal  wall.  I was able to do this without perforating the abdominal wall.  A  fairly significant amount of mesh was left as a large palpable mass  below the umbilicus.   I explored the rest of the abdomen.  I examined the colon and felt no  mass.  I felt in the pelvis and felt no mass or adhesions.  I examined  the small bowel from the ligament of Treitz all the way to the ileocecal  valve.  The small bowel was clearly dilated proximal to the area of  small bowel involved with the mesh and reasonably collapsed distally.   I then had to divide the omentum and separated it from the small bowel  loop and the mesh; this was done with Kelly clamps and then it was  divided and ligated with 2-0 silk ties.  At this point, the omentum was  completely free.   I resected about 4 or 5 inches of small bowel.  I divided the small  bowel both proximal and distal with a GIA stapling device.  The  mesentery was isolated with clamps, divided and the mesenteric vessels  were ligated with 2-0 silk ties.   An anastomosis was created between the small bowel loops using the GIA  stapling device.  The defect in the bowel wall was closed with TA-60  stapling device.  The mesentery was closed with interrupted sutures of 2-  0 silk.  The staple lines were reinforced at critical areas with 3-0  silk sutures.  The anastomosis was inspected very carefully and the  tissues  were quite pink and viable and there was no evidence of any leak  anywhere.   We irrigated the abdomen with saline.  There was still a large mass  effect due to the mesh in the abdominal wall below the umbilicus.  I was  concerned that this might cause another obstruction.  Using the cautery,  I resected all of this mesh out of the subcutaneous tissue and I had to  resect some of the fascia with it and in fact, the resection took a  little bit of the umbilical skin.  The rest of the skin looked healthy,  however.  I chose to close the umbilicus internally with 2 or 3  interrupted sutures of 4-0 Monocryl, just closing the dermis back  together.  After resecting the mass, there was a defect in the fascia  and so I undermined the subcutaneous tissue, but this was able to be  closed primarily.   The small bowel was inspected one more time and the omentum was returned  to its anatomic position.  The nasogastric tube was positioned and the  stomach evacuated.  The midline fascia was closed with a running suture  of double-stranded #1 PDS and a couple of extra sutures of #1 Novofil  were placed around the umbilicus to reinforce the repair.  The subcu  tissue was irrigated with saline.  The skin was closed with skin  staples.  Clean bandages were placed and the patient was taken to the  recovery room in stable condition.  Estimated blood loss was about 50-75  mL.  Complications were none.  Sponge, needle and instrument counts were  correct.      Angelia Mould. Derrell Lolling, M.D.  Electronically Signed     HMI/MEDQ  D:  04/29/2006  T:  04/30/2006  Job:  [id number]   cc:   Gregary Signs A. Everardo All, MD  [address]  [place]  [place] [address]   Leonie Man, M.D.  [address]  [address]  [place]  [place] [address]

## 2010-05-27 NOTE — Discharge Summary (Signed)
Shane [name], Shane [name]                 ACCOUNT NO.:  [id number]   MEDICAL RECORD NO.:  [id number]          PATIENT TYPE:  INP   LOCATION:  1535                         FACILITY:  [facility]   PHYSICIAN:  Valerie A. Felicity Coyer, MDDATE OF BIRTH:  Apr 08, 1950   DATE OF ADMISSION:  04/28/2006  DATE OF DISCHARGE:  05/07/2006                               DISCHARGE SUMMARY   DISCHARGE DIAGNOSES:  1. Recurrent small bowel obstruction secondary to adhesions, status      post exploratory laparotomy with lysis of adhesions and small bowel      resection April 20.  2. Postoperative ileus, resolved.  3. Acute renal insufficiency postoperatively secondary to volume      depletion and hypotension. resolved.  Discharge creatinine 1.1.  4. History of dyslipidemia.  Okay to resume home statin.  5. Hypertension.  Continue home antihypertensives.  6. History of gastroesophageal reflux disease.  Continue home Nexium.  7. Mild acute blood loss anemia postoperatively.  No transfusion      necessary.  Discharge hemoglobin 11.3.   DISCHARGE MEDICATIONS:  1. Vicodin p.r.n. pain.  2. Lopressor 25 mg p.o. b.i.d.  3. Nexium 40 mg daily.  4. Crestor 20 mg p.o. q.h.s. as prior to admission.   DISPOSITION:  The patient is discharged home in medically stable and  improved condition.  He is tolerating a regular p.o. diet with some mild  loose stools but no nausea, vomiting or pain.  The incision is well-  approximated, healing well per Dr. Jacinto Halim assessment.  Hospital follow-  up will be with Dr. Derrell Lolling in 2 weeks.  The patient to call for  appointment.  Also with primary care physician, Dr. Romero Belling.  The  patient will call for appointment in approximately 2 weeks as well.  The  patient is instructed to change dressing to his abdominal wound twice  daily and is ordered for home health to assist in his saline 4x4 moist-  to-dry dressing changes at time of discharge.  Staples have been  discontinued on day of  discharge.   HOSPITAL COURSE BY PROBLEM:  RECURRENT SMALL BOWEL OBSTRUCTION:  The patient is a pleasant 60-year-  old gentleman with history of hypertension and dyslipidemia, who was  previously in the hospital for small bowel obstruction, managed  medically and had appeared to resolve; however, within 24 hours of  discharge home he again developed nausea with abdominal pain prompting  return to the emergency room.  He was readmitted by the medical service  and seen in consultation by general surgery.  As his x-rays failed to  show improvement with conservative management with persistent high-grade  small bowel obstruction, surgery recommended exploratory laparotomy,  which did, in fact, show a small bowel adhesion to the periumbilical  mesh from previous hernia repair.  A lysis of adhesions was undertaken  by Dr. Derrell Lolling with a small bowel resection, resection of the mesh and  primary reconstruction of the abdominal wall.  The patient tolerated  this reasonably well with the only complication being that of mild renal  insufficiency postop, which was treated with supportive  IV fluids and  monitoring of hemodynamic parameters.  He did have a residual postop  ileus for a time after this major surgery, treated with NG suction and  bowel rest.  The patient was appropriately advanced with clear liquids  slowly as tolerated, then to regular diet, which he is now tolerating  well.  The patient has not had any recurrent pain.  His incision is  healing well and on this date he is felt ready for discharge.  He has  been seen by Dr. Derrell Lolling, who has arranged for outpatient home health for  wound care and instructions on clinic follow-up.  There were no other  medical issues requiring continued inpatient observation and treatment.  The patient will follow up with surgery as dictated above.  He will also  contact his primary care physician for any medical problems during this  time as well as routine  follow-up.  He will be continued on his home  Crestor, Nexium and antihypertensives as prior to admission and has been  given a prescription for Vicodin p.r.n. pain by his general surgeon.  Lab parameters have stabilized during this time and he is felt ready for  discharge home.      Valerie A. Felicity Coyer, MD  Electronically Signed     VAL/MEDQ  D:  05/07/2006  T:  05/07/2006  Job:  [id number]

## 2010-05-27 NOTE — H&P (Signed)
[name], [name] NO.:  [id number]   MEDICAL RECORD NO.:  [id number]          PATIENT TYPE:  INP   LOCATION:  1426                         FACILITY:  [facility]   PHYSICIAN:  Titus Dubin. Hopper, MD,FACP,FCCPDATE OF BIRTH:  09-03-1950   DATE OF ADMISSION:  04/24/2006  DATE OF DISCHARGE:                              HISTORY & PHYSICAL   Shane [name] is a 56-year African American male admitted with nausea,  vomiting and small-bowel obstruction.   He began to have abdominal pain across the epigastrium and left upper  and lower quadrants on April 14 which he described as dull, achy but  constant.  It progressed through the day to the point that he requested  his wife bring him to the emergency room.  It was as severe as 9/10.  In  the emergency room, he received Zofran and morphine with significant  relief with pain levels of 0-1.  Unfortunately, he exhibited residual  abdominal distension, and small-bowel obstruction radiographically  prompted admission for definitive therapy.   He felt constipation was a major issue and took milk of magnesia with no  benefit.  He had had a normal bowel movement on April 14.   He has a history of diverticulosis on colonoscopy.  He is on Nexium for  esophageal reflux.  Remotely, he had upper an upper endoscopy by Dr.  Dortha Kern without apparent diagnosis of Barrett's or other significant  pathology.   Other past history includes hypertension, herniorrhaphy, dyslipidemia,  cataract surgery on the right and penile implant.  His last colonoscopy  was in 2003, which revealed diverticulosis.   He is on Nexium 40 mg, Crestor at unknown dose and 2 blood pressure  pills; he does not know the names or the doses.   He drinks occasionally and smokes a pack a day.   His father had CNS cancer, and mother had hypertension.  There is no  family history of stroke, heart attack or diabetes.   He denies headache, nasal purulence or  rhinosinusitis symptoms.   He does have a chronic daily cough with production of approximately a  teaspoon of sputum.   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   He denies chest pain, palpations or shortness of breath.   He has no genitourinary symptoms.   He occasionally has numbness in the right upper extremity in the ulnar  distribution.   The remainder of the review of systems is negative.   At this time, he is not uncomfortable.  Temperature is 97.4, pulse 91,  respiratory rate 22 and O2 sats 97%.  His blood pressure has ranged from  108/68-144/78.   He has no scleral icterus; the lenses are clear.  Arteriolar narrowing  is present.   Nares are patent.   He has an upper plate and a lower partial.   Tympanic membranes are slightly dull.   He has no lymphadenopathy about the head, neck or axillae.   Thyroid is normal to palpation.   Chest reveals a slight decrease in breath sounds,  but there is no  increased work of breathing or abnormal breath sounds.   He has an S4 with slight slurring.   Abdomen is quiet and distended with rare tinkling bowel sounds.  He has  some tenderness on the left.   Genitourinary exam was performed by the ER doctor.   All pulses are intact, and there is no edema.  He has full range of  motion.   There are no localizing neuropsychiatric deficits.   His white count is 16,000, hematocrit 43 with 91% neutrophils.  Sodium  is 133, glucose 173 and creatinine 1.63.  He denies diabetes.   He will now be admitted with small-bowel obstruction for NG drainage and  GI consultation.      Titus Dubin. Alwyn Ren, MD,FACP,FCCP  Electronically Signed     [facility]/MEDQ  D:  04/25/2006  T:  04/25/2006  Job:  [id number]   cc:   Gregary Signs A. Everardo All, MD  [address]  [place]  [place] [address]

## 2010-05-27 NOTE — Op Note (Signed)
NAME:  Shane [name], Shane [name] NO.:  [id number]   MEDICAL RECORD NO.:  [id number]                   PATIENT TYPE:  AMB   LOCATION:  ENDO                                 FACILITY:  [facility]   PHYSICIAN:  Sharyn Dross., M.D.               DATE OF BIRTH:  11/11/1950   DATE OF PROCEDURE:  09/20/2001  DATE OF DISCHARGE:                                 OPERATIVE REPORT   PREOPERATIVE DIAGNOSIS:  Blood in stool.   POSTOPERATIVE DIAGNOSES:  1. External hemorrhoids.  2. Diverticulosis in the rectosigmoid area, otherwise normal examination.   PROCEDURE:  Colonoscopy.   MEDICATIONS:  Demerol 100 mg IV, Versed 10 mg IV.   ENDOSCOPE:  Olympus video panendoscope.   ENDOSCOPIST:  Dortha Kern, M.D.   CLINICAL NOTE:  This 60 year old gentleman came into the office for an  evaluation at this time.  He has been noted to have some periumbilical  discomfort with a history of internal hemorrhoids.  There has been no gross  bleeding that was present, but there has been some blood that was noted on  the tissue samples that was present.  There was questionable history/family  history of colon polyps that was noted in his first generation relatives,  but again this was noted to be a vague history that was present.  He denies  any other history of any GI symptoms such as abdominal pain or peptic ulcer  disease, gallbladder, or liver disease that was present.  The patient does  admit to imbibing alcohol at this time.   PHYSICAL EXAMINATION:  GENERAL:  He is a pleasant gentleman, appears to be  in no acute distress.  VITAL SIGNS:  Stable.  HEENT:  Positive for arcus senilis.  NECK:  Supple.  CHEST:  The lungs are clear to auscultation.  CARDIAC:  The heart is a regular rate and rhythm without heaves, thrills,  murmurs, or gallops.  ABDOMEN:  Soft.  There is no tenderness to palpation, no hepatosplenomegaly  that is noted.  RECTAL:  Digital rectal examination is presently  unremarkable.  EXTREMITIES:  No cyanosis, clubbing, or edema.   PLAN:  Because of a questionable family history of colon polyps and  hemorrhoids with some noted blood present, I am going to proceed with a GI  evaluation to ensure that there is no evidence of any previous bleeding that  was present.   INFORMED CONSENT:  The procedure and the indications and the risks were  explained to the patient.  The patient viewed the video, and consent form  was signed.   PREPROCEDURE PREPARATION:  The patient came into [facility] endoscopy  unit, where an IV for IV sedating medication was started.  A monitor was  placed on the patient to monitor the patient's vital signs and oxygen  saturation.  Nasal oxygen approximately 2 L/min. was used and after adequate  sedation was performed, the  procedure was begun.   BOWEL PREPARATION:  The patient took bowel prep, GoLYTELY and Reglan, to be  used.  The quality of the prep was fair to good.  There was still increased  amount of stool that was present, but only small layers were appreciated,  which were easily washed in most areas.   DESCRIPTION OF PROCEDURE:  The instrument was advanced with the patient  lying in the left lateral decubitus position, approximately 95 cm proximal  colon to the cecum.  This was identified by visualization of the ileocecal  valve and palpation of this node as well as transillumination.   There appeared to be no gross abnormalities such as masses, polyps, or  stricture lesions appreciated.  The vascular pattern appeared to be within  normal limits throughout the colon to the cecum area at this time.  The  mucosal pattern showed evidence of rectosigmoid diverticula, which were  noted to be small to moderate in size at this time.  There was no evidence  of surrounding inflammation around the diverticula that were appreciated.  Photographs of these areas were taken at this time.  The instrument was  retracted back where the  instrument was retroflexed in the rectum area to  visualize the anal verge.  There was no evidence of any internal hemorrhoids  that could be appreciated.  Closer inspection of the area there could be  some slight enlargement with a blush hue that would be visualized was not  present at this time.  The instrument was retracted back and removed per  rectum with evidence of the external hemorrhoids that were quickly  visualized, but the instrument was projected out before photographs could be  adequately taken at this time.  The patient tolerated the procedure well.   TREATMENT:  1. I am going to recommend using ProctoCream with hydrocortisone in the     rectal area at this time.  2. Will have the patient follow up with me in the office in the next two     weeks at this time.  3. Would also recommend a high-fiber diet for the patient during the     interim, and depending upon these results will determine the course of     therapy.                                               Sharyn Dross., M.D.    JM/MEDQ  D:  09/20/2001  T:  09/21/2001  Job:  [id number]

## 2010-05-27 NOTE — Discharge Summary (Signed)
Shane [name], Shane [name]                 ACCOUNT NO.:  [id number]   MEDICAL RECORD NO.:  [id number]          PATIENT TYPE:  INP   LOCATION:  1426                         FACILITY:  [facility]   PHYSICIAN:  Valerie A. Felicity Coyer, MDDATE OF BIRTH:  13-Apr-1950   DATE OF ADMISSION:  04/24/2006  DATE OF DISCHARGE:  04/27/2006                               DISCHARGE SUMMARY   DISCHARGE DIAGNOSES:  1. Small bowel obstruction likely secondary to adhesions, clinically      improved.  2. Acute renal insufficiency secondary to dehydration and volume      depletion, now improving.  Discharge creatinine 1.85 (baseline 1.3      in August 2007).  3. Hypertension.  4. History of reflux disease.  5. Dyslipidemia.   DISCHARGE MEDICATIONS:  As prior to admission and include Crestor,  Nexium, and blood pressure pills as prior to admission though the  patient cannot currently recall the name of these blood pressure  medications.   CONDITION ON DISCHARGE:  Medically improved and stable.  He is  tolerating a regular p.o. diet without nausea, vomiting, or pain more  than 24 hours after removal of an NG tube.  He is passing gas and had  numerous bowel movements with clinical resolution of small bowel.   He is instructed that should he have recurrent nausea, vomiting,  increasing distention, pain, or lack of flatus, to return to the  emergency room or call M.D. for further instructions.   HOSPITAL COURSE BY PROBLEM:  1. Small bowel obstruction.  The patient is a 60 year old gentleman in      reasonably good health who underwent an umbilical hernia repair      with mesh in 2006, and a right inguinal hernia repair in 2006,      presented to the emergency room early the morning of April 25, 2006, with nausea, vomiting, and radiographic evidence of small      bowel obstruction.  This had been associated with extensive pain      and treated with morphine and Zofran in the emergency room, reduced      the pain  but not the nausea, vomiting.  Thus, he was referred for      admission.  He was treated conservatively with placement of NG tube      for decompression and bowel rest.  A GI consult was obtained and      seen in consultation by [facility], Dr. Stan Head, who agreed      with initial conservative management with plans to call general      surgery if he failed to resolve.  Fortunately for the patient, his      symptoms did improve and he started having bowel movements, flatus,      and decrease of abdominal distention by the following morning.      Radiographically, this bowel obstruction was slower to resolve but      the NG tube was clamped and he was begun on a clear liquid diet.      He tolerated this well  and the NG tube was thus discontinued the      evening of April 26, 2006.  By the morning of April 27, 2006, he      has had no further nausea, vomiting, and is tolerating his diet      well.  He will be tried on a full regular diet prior to discharge      and tolerating this will be discharged home.  2. Other medical issues are as listed before with the volume depletion      from NG suction and GI loss, he did experience transient acute      renal insufficiency with a creatinine rising from 1.6 at admission      up to 2.4, but then      back down to 1.8 with IV fluid by time of discharge.  As he is      tolerating p.o. well without further GI loss, it is anticipated he      will return to his baseline creatinine of 1.3 without further      intervention necessary.   OUTPATIENT FOLLOWUP:  With primary M.D. to be arranged by the patient  per routine.      Valerie A. Felicity Coyer, MD  Electronically Signed     VAL/MEDQ  D:  04/27/2006  T:  04/27/2006  Job:  [id number]

## 2010-05-27 NOTE — Op Note (Signed)
Shane [name], Shane NO.:  [id number]   MEDICAL RECORD NO.:  [id number]          PATIENT TYPE:  OIB   LOCATION:  2899                         FACILITY:  [facility]   PHYSICIAN:  Leonie Man, M.D.   DATE OF BIRTH:  1950-10-14   DATE OF PROCEDURE:  04/07/2004  DATE OF DISCHARGE:                                 OPERATIVE REPORT   PREOPERATIVE DIAGNOSES:  1.  Incarcerated umbilical hernia.  2.  Right inguinal hernia.   POSTOPERATIVE DIAGNOSES:  1.  Incarcerated umbilical hernia.  2.  Right inguinal hernia.   PROCEDURE:  1.  Right inguinal herniorrhaphy with mesh.  2.  Repair of incarcerated umbilical hernia with mesh.   SURGEON:  Leonie Man, M.D.   ASSISTANT:  OR tech.   ANESTHESIA:  General.   SPECIMENS TO THE PATHOLOGIST:  Hernia sac from the umbilicus.   ESTIMATED BLOOD LOSS:  Minimal.   COMPLICATIONS:  None.   NOTE:  The patient is a 60 year old gentleman presenting with a right  inguinal hernia and umbilical hernia for repair.  I have discussed with them  the risks and potential benefits of surgery as well as the alternatives to  surgery; he understands and gives consent to surgery.   PROCEDURE:  The patient is brought to the operating room and positively  identified as Shane [name], and the right inguinal hernia is a positively  identified and marked.  Following induction of satisfactory general  anesthesia, the abdomen is prepped and draped to be included in a sterile  operative field.  The right inguinal hernia was first approach by using a  transverse incision in the lower abdominal crease, deepened through skin and  subcutaneous tissues, dissecting down to the external oblique aponeurosis.  The external oblique aponeurosis was opened up through the external inguinal  ring with elevation off the spermatic cord and dissection of a very large  direct hernia sac from off the cord.  Exploration of the spermatic cord did  not reveal an  indirect hernia.  The large direct hernia was then repaired  with an onlay patch of polypropylene mesh, which was sewn into the pubic  tubercle and carried up along the conjoined tendon to the internal ring and  again from the pubic tubercle up along the shelving edge of Poupart's  ligament to the internal ring.  The tails of mesh was split so as to  encircle the spermatic cord and the tails of mesh were then sutured down at  the internal oblique muscle behind the cord.  The resulting newly-formed  internal ring was snug but not tight so as to not compromise the vessels of  the spermatic cord.  The tip of a hemostat could easily be inserted into  this area.  All areas of the dissection were then checked for hemostasis and  noted to be dry.  The external oblique aponeurosis was closed over the  spermatic cord with a running 2-0 Vicryl suture, the Scarpa's fascia closed  with running 3-0 Vicryl  suture and  skin closed with running 4-0 Monocryl  suture.  Attention was then turned to the incarcerated umbilical hernia, where a  circumumbilical incision was carried down over the superior aspect of the  umbilicus and deepened through skin, down to the hernia sac.  The hernia sac  was dissected free down to the fascia on all sides, dissecting the hernia  sac from off of its attachments to the umbilical skin.  The fascia was  scored and the large incarcerated segment of omentum is taken between clamps  and secured with ties of 2-0 silk.  The remainder of the omentum is reduced  into the peritoneal cavity and the edges of the hernia sac are then cleared  and prepared for repair, the hernia then repaired with a large Bard plug,  which is placed into the defect, and sutured to the fascia with #1 Novofil  sutures at 12 o'clock, 6 o'clock, 3 o'clock and 9 o'clock positions, also at  the 2 o'clock, 8 o'clock, 10 o'clock to 4 o'clock position.  The resulting  repair was noted to be intact.  Sponge,  instrument and sharp counts were  verified.  Subcutaneous tissues were closed with a running 2-0 Vicryl suture  and the skin closed with 4-0 Monocryl suture.  The sterile dressings were  applied to both wounds, anesthetic reversed and the patient removed from the  operating room to the recovery room in stable condition.  He tolerated the  procedure well.      PB/MEDQ  D:  04/07/2004  T:  04/08/2004  Job:  [id number]

## 2010-09-30 LAB — URINALYSIS, ROUTINE W REFLEX MICROSCOPIC
Glucose, UA: NEGATIVE
Ketones, ur: NEGATIVE
Leukocytes, UA: NEGATIVE
Specific Gravity, Urine: 1.02
pH: 6

## 2010-09-30 LAB — BASIC METABOLIC PANEL
BUN: 6
BUN: 8
CO2: 26
CO2: 27
CO2: 32
Calcium: 9.1
Calcium: 9.2
Creatinine, Ser: 1.17
Creatinine, Ser: 1.24
GFR calc Af Amer: >60
GFR calc Af Amer: >60
GFR calc Af Amer: >60
GFR calc non Af Amer: 60 — ABNORMAL LOW
GFR calc non Af Amer: >60
GFR calc non Af Amer: >60
Glucose, Bld: 104 — ABNORMAL HIGH
Glucose, Bld: 115 — ABNORMAL HIGH
Glucose, Bld: 123 — ABNORMAL HIGH
Potassium: 3.7
Potassium: 3.8
Sodium: 135
Sodium: 138
Sodium: 139

## 2010-09-30 LAB — CBC
HCT: 35 — ABNORMAL LOW
HCT: 40.1
Hemoglobin: 11.9 — ABNORMAL LOW
Hemoglobin: 13.5
Hemoglobin: 13.7
MCHC: 33.6
MCHC: 33.7
MCHC: 33.7
MCHC: 33.9
MCV: 80.9
MCV: 81.6
MCV: 81.8
Platelets: 209
Platelets: 210
Platelets: 224
RBC: 4.94
RBC: 5.04
RDW: 14
RDW: 14.3
RDW: 14.5

## 2010-09-30 LAB — DIFFERENTIAL
Eosinophils Relative: 3
Lymphocytes Relative: 15
Lymphs Abs: 1.3

## 2010-09-30 LAB — COMPREHENSIVE METABOLIC PANEL
AST: 23
Albumin: 3.7
Calcium: 9.3
Creatinine, Ser: 1.16
GFR calc Af Amer: >60
GFR calc non Af Amer: >60

## 2010-09-30 LAB — URINE MICROSCOPIC-ADD ON

## 2010-12-15 ENCOUNTER — Other Ambulatory Visit: Payer: Self-pay | Admitting: Endocrinology

## 2011-06-16 ENCOUNTER — Ambulatory Visit (INDEPENDENT_AMBULATORY_CARE_PROVIDER_SITE_OTHER): Payer: PRIVATE HEALTH INSURANCE | Admitting: Internal Medicine

## 2011-06-16 VITALS — BP 162/90 | HR 85 | Temp 97.0°F | Ht 71.0 in | Wt 244.1 lb

## 2011-06-16 DIAGNOSIS — E119 Type 2 diabetes mellitus without complications: Secondary | ICD-10-CM

## 2011-06-16 DIAGNOSIS — I10 Essential (primary) hypertension: Secondary | ICD-10-CM

## 2011-06-16 DIAGNOSIS — L259 Unspecified contact dermatitis, unspecified cause: Secondary | ICD-10-CM

## 2011-06-16 MED ORDER — METHYLPREDNISOLONE ACETATE 80 MG/ML IJ SUSP
120.0000 mg | Freq: Once | INTRAMUSCULAR | Status: AC
  Administered 2011-06-16: 120 mg via INTRAMUSCULAR

## 2011-06-16 MED ORDER — PREDNISONE 10 MG PO TABS: ORAL_TABLET | ORAL | Status: DC

## 2011-06-16 MED ORDER — TRIAMCINOLONE ACETONIDE 0.1 % EX CREA: TOPICAL_CREAM | Freq: Two times a day (BID) | CUTANEOUS | Status: DC

## 2011-06-16 NOTE — Patient Instructions (Signed)
You had the steroid shot today Take all new medications as prescribed   - the steroid cream and the pills Continue all other medications as before Please remember your blood sugars may be mildly elevated on the prednisone, but they go back down after the prednisone

## 2011-06-17 ENCOUNTER — Encounter: Payer: Self-pay | Admitting: Internal Medicine

## 2011-06-17 DIAGNOSIS — K635 Polyp of colon: Secondary | ICD-10-CM

## 2011-06-17 DIAGNOSIS — K5790 Diverticulosis of intestine, part unspecified, without perforation or abscess without bleeding: Secondary | ICD-10-CM

## 2011-06-17 HISTORY — DX: Polyp of colon: K63.5

## 2011-06-17 HISTORY — DX: Diverticulosis of intestine, part unspecified, without perforation or abscess without bleeding: K57.90

## 2011-06-17 NOTE — Progress Notes (Signed)
Subjective:    Patient ID: Shane [name], male    DOB: October 25, 1950, 61 y.o.   MRN: [id number]  HPI  Here with 3 days onset marked itch rash to arms and some to chest and right leg after doing yardwork,  No fever or prior hx.  Pt denies chest pain, increased sob or doe, wheezing, orthopnea, PND, increased LE swelling, palpitations, dizziness or syncope.  Pt denies new neurological symptoms such as new headache, or facial or extremity weakness or numbness   Pt denies polydipsia, polyuria.   Pt denies fever, wt loss, night sweats, loss of appetite, or other constitutional symptoms Past Medical History  Diagnosis Date  . DIABETES MELLITUS, TYPE II 09/25/2006    Qualifier: Diagnosis of  By: Everardo All MD, Cleophas Dunker   . HYPERCHOLESTEROLEMIA 01/22/2007    Qualifier: Diagnosis of  By: Francie Massing    . HYPERTENSION 08/24/2006    Qualifier: Diagnosis of  By: Everardo All MD, Sean A   . PSA, INCREASED 11/30/2009    Qualifier: Diagnosis of  By: Everardo All MD, Cleophas Dunker RENAL INSUFFICIENCY 09/25/2006    Qualifier: Diagnosis of  By: Everardo All MD, Sean A   . INSOMNIA 09/10/2008    Qualifier: Diagnosis of  By: Everardo All MD, Cleophas Dunker Colon polyps 06/17/2011  . Diverticulosis 06/17/2011   Past Surgical History  Procedure Date  . Hernia repair     reports that he has been smoking.  He has never used smokeless tobacco. He reports that he drinks alcohol. He reports that he does not use illicit drugs. family history includes Cancer in his father and Hypertension in his father. No Known Allergies Current Outpatient Prescriptions on File Prior to Visit  Medication Sig Dispense Refill  . hydrochlorothiazide (MICROZIDE) 12.5 MG capsule TAKE ONE CAPSULE BY MOUTH EVERY DAY  90 capsule  0  . simvastatin (ZOCOR) 80 MG tablet TAKE ONE TABLET BY MOUTH EVERY DAY AT BEDTIME  90 tablet  0  . traZODone (DESYREL) 150 MG tablet TAKE ONE TABLET BY MOUTH AT BEDTIME  90 tablet  0   No current facility-administered medications on file  prior to visit.   Review of Systems Review of Systems  Constitutional: Negative for diaphoresis and unexpected weight change.    Eyes: Negative for photophobia and visual disturbance.  Respiratory: Negative for choking and stridor.   Gastrointestinal: Negative for vomiting and blood in stool.  Genitourinary: Negative for hematuria and decreased urine volume.  Musculoskeletal: Negative for gait problem.  Skin: Negative for color change and wound.  Neurological: Negative for tremors and numbness.  Psychiatric/Behavioral: Negative for decreased concentration. The patient is not hyperactive.       Objective:   Physical Exam BP 162/90  Pulse 85  Temp(Src) 97 F (36.1 C) (Oral)  Ht 5\' 11"  (1.803 m)  Wt 244 lb 2 oz (110.734 kg)  BMI 34.05 kg/m2  SpO2 99% Physical Exam  VS noted, not ill appearing Constitutional: Pt appears well-developed and well-nourished.  HENT: Head: Normocephalic.  Right Ear: External ear normal.  Left Ear: External ear normal.  Eyes: Conjunctivae and EOM are normal. Pupils are equal, round, and reactive to light.  Neck: Normal range of motion. Neck supple.  Cardiovascular: Normal rate and regular rhythm.   Pulmonary/Chest: Effort normal and breath sounds normal.  Neurological: Pt is alert. Not confused Skin: Skin is warm. No erythema. Typical contact dermatitis to arms, chest and right leg noted Psychiatric: Pt behavior is normal. Thought content normal.  Assessment & Plan:

## 2011-06-17 NOTE — Assessment & Plan Note (Signed)
stable overall by hx and exam, most recent data reviewed with pt, and pt to continue medical treatment as before  Lab Results  Component Value Date   HGBA1C 6.0 11/22/2009

## 2011-06-17 NOTE — Assessment & Plan Note (Signed)
Mild to mod, for depomedrol IM, predpack asd, triam cr prn,  to f/u any worsening symptoms or concerns

## 2011-06-17 NOTE — Assessment & Plan Note (Signed)
Mild elev today but likely reactive, overall stable by hx and exam, most recent data reviewed with pt, and pt to continue medical treatment as before, will need f/u with PCP BP Readings from Last 3 Encounters:  06/16/11 162/90  11/30/09 132/84  09/10/08 116/68

## 2011-07-06 ENCOUNTER — Other Ambulatory Visit (INDEPENDENT_AMBULATORY_CARE_PROVIDER_SITE_OTHER): Payer: PRIVATE HEALTH INSURANCE

## 2011-07-06 ENCOUNTER — Ambulatory Visit (INDEPENDENT_AMBULATORY_CARE_PROVIDER_SITE_OTHER): Payer: PRIVATE HEALTH INSURANCE | Admitting: Endocrinology

## 2011-07-06 ENCOUNTER — Encounter: Payer: Self-pay | Admitting: Endocrinology

## 2011-07-06 VITALS — BP 142/70 | HR 89 | Temp 97.8°F | Ht 71.0 in | Wt 238.0 lb

## 2011-07-06 DIAGNOSIS — Z79899 Other long term (current) drug therapy: Secondary | ICD-10-CM

## 2011-07-06 DIAGNOSIS — I10 Essential (primary) hypertension: Secondary | ICD-10-CM

## 2011-07-06 DIAGNOSIS — R972 Elevated prostate specific antigen [PSA]: Secondary | ICD-10-CM

## 2011-07-06 DIAGNOSIS — E7800 Pure hypercholesterolemia, unspecified: Secondary | ICD-10-CM

## 2011-07-06 DIAGNOSIS — E119 Type 2 diabetes mellitus without complications: Secondary | ICD-10-CM

## 2011-07-06 DIAGNOSIS — N259 Disorder resulting from impaired renal tubular function, unspecified: Secondary | ICD-10-CM

## 2011-07-06 LAB — TSH: TSH: 1.26 u[IU]/mL (ref 0.35–5.50)

## 2011-07-06 LAB — CBC WITH DIFFERENTIAL/PLATELET
Basophils Relative: 0.5 % (ref 0.0–3.0)
Eosinophils Absolute: 0.2 10*3/uL (ref 0.0–0.7)
Hemoglobin: 14.2 g/dL (ref 13.0–17.0)
MCHC: 32.3 g/dL (ref 30.0–36.0)
MCV: 84.4 fl (ref 78.0–100.0)
Monocytes Absolute: 1 10*3/uL (ref 0.1–1.0)
Neutro Abs: 8.3 10*3/uL — ABNORMAL HIGH (ref 1.4–7.7)
RBC: 5.2 Mil/uL (ref 4.22–5.81)

## 2011-07-06 LAB — HEPATIC FUNCTION PANEL
AST: 28 U/L (ref 0–37)
Alkaline Phosphatase: 106 U/L (ref 39–117)
Bilirubin, Direct: 0.1 mg/dL (ref 0.0–0.3)
Total Protein: 7 g/dL (ref 6.0–8.3)

## 2011-07-06 LAB — URINALYSIS, ROUTINE W REFLEX MICROSCOPIC
Bilirubin Urine: NEGATIVE
Ketones, ur: NEGATIVE
Urine Glucose: NEGATIVE
Urobilinogen, UA: 0.2 (ref 0.0–1.0)

## 2011-07-06 LAB — BASIC METABOLIC PANEL
BUN: 15 mg/dL (ref 6–23)
Chloride: 105 mEq/L (ref 96–112)
Creatinine, Ser: 1.2 mg/dL (ref 0.4–1.5)

## 2011-07-06 LAB — LIPID PANEL
HDL: 55 mg/dL (ref >39.00)
Total CHOL/HDL Ratio: 4
VLDL: 19.2 mg/dL (ref 0.0–40.0)

## 2011-07-06 LAB — LDL CHOLESTEROL, DIRECT: Direct LDL: 128.3 mg/dL

## 2011-07-06 MED ORDER — TRAZODONE HCL 150 MG PO TABS: ORAL_TABLET | ORAL | Status: DC

## 2011-07-06 MED ORDER — CEFUROXIME AXETIL 250 MG PO TABS: 250.0000 mg | ORAL_TABLET | Freq: Two times a day (BID) | ORAL | Status: AC

## 2011-07-06 MED ORDER — METFORMIN HCL ER 500 MG PO TB24: 500.0000 mg | ORAL_TABLET | Freq: Every day | ORAL | Status: DC

## 2011-07-06 MED ORDER — HYDROCHLOROTHIAZIDE 12.5 MG PO CAPS: ORAL_CAPSULE | ORAL | Status: DC

## 2011-07-06 MED ORDER — SIMVASTATIN 80 MG PO TABS: ORAL_TABLET | ORAL | Status: DC

## 2011-07-06 NOTE — Progress Notes (Signed)
Subjective:    Patient ID: Shane [name], male    DOB: [birth year], 61 y.o.   MRN: [id number]  HPI Pt states few week of intermittent slight pain at the right ear, and assoc nasal congestion. Past Medical History  Diagnosis Date  . DIABETES MELLITUS, TYPE II 09/25/2006    Qualifier: Diagnosis of  By: Everardo All MD, Cleophas Dunker   . HYPERCHOLESTEROLEMIA 01/22/2007    Qualifier: Diagnosis of  By: Francie Massing    . HYPERTENSION 08/24/2006    Qualifier: Diagnosis of  By: Everardo All MD, Hensley Aziz A   . PSA, INCREASED 11/30/2009    Qualifier: Diagnosis of  By: Everardo All MD, Cleophas Dunker RENAL INSUFFICIENCY 09/25/2006    Qualifier: Diagnosis of  By: Everardo All MD, Karinda Cabriales A   . INSOMNIA 09/10/2008    Qualifier: Diagnosis of  By: Everardo All MD, Cleophas Dunker Colon polyps 06/17/2011  . Diverticulosis 06/17/2011  . CIGARETTE SMOKER 11/30/2009    Qualifier: Diagnosis of  By: Everardo All MD, Cleophas Dunker     Past Surgical History  Procedure Date  . Hernia repair     History   Social History  . Marital Status: Married    Spouse Name: N/A    Number of Children: N/A  . Years of Education: N/A   Occupational History  . [employer]    Social History Main Topics  . Smoking status: Current Everyday Smoker  . Smokeless tobacco: Never Used  . Alcohol Use: Yes  . Drug Use: No  . Sexually Active:    Other Topics Concern  . 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 hydrochlorothiazide (MICROZIDE) 12.5 MG capsule TAKE ONE CAPSULE BY MOUTH EVERY DAY  90 capsule  2  . metFORMIN (GLUCOPHAGE-XR) 500 MG 24 hr tablet Take 1 tablet (500 mg total) by mouth daily.  90 tablet  2  . simvastatin (ZOCOR) 80 MG tablet TAKE ONE TABLET BY MOUTH EVERY DAY AT BEDTIME  90 tablet  2  . traZODone (DESYREL) 150 MG tablet TAKE ONE TABLET BY MOUTH AT BEDTIME  90 tablet  2  . triamcinolone cream (KENALOG) 0.1 %  Apply topically 2 (two) times daily.  30 g  0    No Known Allergies  Family History  Problem Relation Age of Onset  . Cancer Father   . Hypertension Father     BP 142/70  Pulse 89  Temp 97.8 F (36.6 C) (Oral)  Ht 5\' 11"  (1.803 m)  Wt 238 lb (107.956 kg)  BMI 33.19 kg/m2  SpO2 97%    Review of Systems Denies fever and eac drainage.     Objective:   Physical Exam VITAL SIGNS:  See vs page GENERAL: no distress head: no deformity eyes: no periorbital swelling, no proptosis external nose and ears are normal mouth: no lesion seen Both eac's are normal.  Right tm is red, but the left is normal.   Lab Results  Component Value Date   PSA 5.62* 07/06/2011   PSA 3.96 11/22/2009   PSA 4.02* 09/24/2006   Lab Results  Component Value Date   CHOL 209* 07/06/2011   HDL 55.00 07/06/2011   LDLCALC 84 06/02/2008   LDLDIRECT 128.3 07/06/2011   TRIG 96.0 07/06/2011   CHOLHDL 4 07/06/2011      Assessment & Plan:  URI.  New  Dyslipidemia, needs increased rx elev psa, new

## 2011-07-06 NOTE — Patient Instructions (Addendum)
i have sent a prescription to your pharmacy, for an antibiotic Loratadine-d (non-prescription) will help your congestion. blood tests are being requested for you today.  You will receive a letter with results. Please schedule a regular physical soon.  We'll recheck the blood pressure then.

## 2011-07-07 ENCOUNTER — Telehealth: Payer: Self-pay | Admitting: *Deleted

## 2011-07-07 ENCOUNTER — Encounter: Payer: Self-pay | Admitting: Endocrinology

## 2011-07-07 LAB — MICROALBUMIN / CREATININE URINE RATIO
Creatinine,U: 286.5 mg/dL
Microalb, Ur: 46.5 mg/dL — ABNORMAL HIGH (ref 0.0–1.9)

## 2011-07-07 NOTE — Telephone Encounter (Signed)
Called pt to inform of lab results, pt informed (letter also mailed to pt). 

## 2011-08-08 ENCOUNTER — Encounter: Payer: Self-pay | Admitting: Endocrinology

## 2011-08-08 ENCOUNTER — Ambulatory Visit (INDEPENDENT_AMBULATORY_CARE_PROVIDER_SITE_OTHER): Payer: PRIVATE HEALTH INSURANCE | Admitting: Endocrinology

## 2011-08-08 VITALS — BP 122/88 | HR 80 | Temp 98.0°F | Wt 239.0 lb

## 2011-08-08 DIAGNOSIS — K635 Polyp of colon: Secondary | ICD-10-CM

## 2011-08-08 DIAGNOSIS — Z2911 Encounter for prophylactic immunotherapy for respiratory syncytial virus (RSV): Secondary | ICD-10-CM

## 2011-08-08 DIAGNOSIS — Z23 Encounter for immunization: Secondary | ICD-10-CM

## 2011-08-08 DIAGNOSIS — E119 Type 2 diabetes mellitus without complications: Secondary | ICD-10-CM

## 2011-08-08 DIAGNOSIS — D126 Benign neoplasm of colon, unspecified: Secondary | ICD-10-CM

## 2011-08-08 MED ORDER — ATORVASTATIN CALCIUM 80 MG PO TABS: 80.0000 mg | ORAL_TABLET | Freq: Every day | ORAL | Status: DC

## 2011-08-08 MED ORDER — HYDROCORTISONE ACE-PRAMOXINE 1-1 % RE CREA: TOPICAL_CREAM | Freq: Two times a day (BID) | RECTAL | Status: AC

## 2011-08-08 MED ORDER — TERBINAFINE HCL 250 MG PO TABS: 250.0000 mg | ORAL_TABLET | Freq: Every day | ORAL | Status: AC

## 2011-08-08 NOTE — Progress Notes (Signed)
Subjective:    Patient ID: Shane [name], male    DOB: 06-25-1950, 61 y.o.   MRN: [id number]  HPI here for regular wellness examination.  He's feeling pretty well in general, and says chronic med probs are stable, except as noted below. Past Medical History  Diagnosis Date  . DIABETES MELLITUS, TYPE II 09/25/2006    Qualifier: Diagnosis of  By: Everardo All MD, Cleophas Dunker   . HYPERCHOLESTEROLEMIA 01/22/2007    Qualifier: Diagnosis of  By: Francie Massing    . HYPERTENSION 08/24/2006    Qualifier: Diagnosis of  By: Everardo All MD, Stace Peace A   . PSA, INCREASED 11/30/2009    Qualifier: Diagnosis of  By: Everardo All MD, Cleophas Dunker RENAL INSUFFICIENCY 09/25/2006    Qualifier: Diagnosis of  By: Everardo All MD, Peaches Vanoverbeke A   . INSOMNIA 09/10/2008    Qualifier: Diagnosis of  By: Everardo All MD, Cleophas Dunker Colon polyps 06/17/2011  . Diverticulosis 06/17/2011  . CIGARETTE SMOKER 11/30/2009    Qualifier: Diagnosis of  By: Everardo All MD, Cleophas Dunker     Past Surgical History  Procedure Date  . Hernia repair     History   Social History  . Marital Status: Married    Spouse Name: N/A    Number of Children: N/A  . Years of Education: N/A   Occupational History  . [employer]    Social History Main Topics  . Smoking status: Current Everyday Smoker  . Smokeless tobacco: Never Used  . Alcohol Use: Yes  . Drug Use: No  . Sexually Active:    Other Topics Concern  . 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 hydrochlorothiazide (MICROZIDE) 12.5 MG capsule TAKE ONE CAPSULE BY MOUTH EVERY DAY  90 capsule  2  . metFORMIN (GLUCOPHAGE-XR) 500 MG 24 hr tablet Take 1 tablet (500 mg total) by mouth daily.  90 tablet  2  . traZODone (DESYREL) 150 MG tablet TAKE ONE TABLET BY MOUTH AT BEDTIME  90 tablet  2  . atorvastatin (LIPITOR) 80 MG tablet Take 1 tablet (80 mg total) by mouth daily.  90  tablet  3    No Known Allergies  Family History  Problem Relation Age of Onset  . Cancer Father   . Hypertension Father    BP 122/88  Pulse 80  Temp 98 F (36.7 C) (Oral)  Wt 239 lb (108.41 kg)  SpO2 97%  Review of Systems  Constitutional: Negative for fever.  HENT: Negative for hearing loss.   Eyes: Negative for visual disturbance.  Respiratory: Negative for shortness of breath.   Cardiovascular: Negative for chest pain.  Gastrointestinal: Negative for anal bleeding.  Genitourinary: Negative for hematuria.  Musculoskeletal: Negative for back pain.  Skin: Negative for rash.  Neurological: Negative for syncope.  Hematological: Does not bruise/bleed easily.  Psychiatric/Behavioral: Negative for dysphoric mood.      Objective:   Physical Exam VS: see vs page GEN: no distress HEAD: head: no deformity eyes: no periorbital swelling, no proptosis external nose and ears are normal mouth: no lesion seen NECK: supple, thyroid is not enlarged CHEST WALL: no deformity LUNGS: clear to auscultation BREASTS:  No gynecomastia CV: reg rate and rhythm, no murmur ABD: abdomen is soft, nontender.  no hepatosplenomegaly.  not distended.  Small self-reducing ventral hernia. Old healed surgical scar.  GENITALIA/RECTAL/PROSTATE:  sees urology MUSCULOSKELETAL: muscle bulk and strength are grossly normal.  no obvious joint swelling.  gait is normal and steady. PULSES: dorsalis pedis intact bilat.  no carotid bruit NEURO:  cn 2-12 grossly intact.   readily moves all 4's.  sensation is intact to touch on the feet SKIN:  Normal texture and temperature.  No rash or suspicious lesion is visible.   NODES:  None palpable at the neck PSYCH: alert, oriented x3.  Does not appear anxious nor depressed.     Assessment & Plan:  Wellness visit today, with problems stable, except as noted.   SEPARATE EVALUATION FOLLOWS--EACH PROBLEM HERE IS NEW, NOT RESPONDING TO TREATMENT, OR POSES SIGNIFICANT RISK  TO THE PATIENT'S HEALTH: HISTORY OF THE PRESENT ILLNESS: Pt states few few mos of moderate thickening of the toenails, but no assoc pain. PAST MEDICAL HISTORY reviewed and up to date today REVIEW OF SYSTEMS: He has lost a few lbs, due to his efforts.  Denies numbness PHYSICAL EXAMINATION: VITAL SIGNS:  See vs page GENERAL: no distress Feet: no deformity.  no ulcer on the feet.  feet are of normal color and temp.  no edema.  There is bilateral onychomycosis. LAB/XRAY RESULTS: Lab Results  Component Value Date   CHOL 209* 07/06/2011   HDL 55.00 07/06/2011   LDLCALC 84 06/02/2008   LDLDIRECT 128.3 07/06/2011   TRIG 96.0 07/06/2011   CHOLHDL 4 07/06/2011  IMPRESSION: Onychomycosis, new Dyslipidemia, new PLAN: See instruction page

## 2011-08-08 NOTE — Patient Instructions (Addendum)
Change simvastatin to lipitor.  i have sent a prescription to your pharmacy i have also sent a prescription to your pharmacy, for the toenail fungus. please consider these measures for your health:  minimize alcohol.  do not use tobacco products.  have a colonoscopy at least every 10 years from age 61.  keep firearms safely stored.  always use seat belts.  have working smoke alarms in your home.  see an eye doctor and dentist regularly.  never drive under the influence of alcohol or drugs (including prescription drugs).   Please return in 1 year. you will receive a phone call, about a day and time for an appointment, for your colonoscopy.

## 2011-08-11 ENCOUNTER — Encounter: Payer: Self-pay | Admitting: Gastroenterology

## 2011-09-06 ENCOUNTER — Encounter: Payer: Self-pay | Admitting: Gastroenterology

## 2011-09-06 ENCOUNTER — Ambulatory Visit (AMBULATORY_SURGERY_CENTER): Payer: PRIVATE HEALTH INSURANCE

## 2011-09-06 VITALS — Ht 71.0 in | Wt 240.5 lb

## 2011-09-06 DIAGNOSIS — Z1211 Encounter for screening for malignant neoplasm of colon: Secondary | ICD-10-CM

## 2011-09-06 DIAGNOSIS — Z86010 Personal history of colonic polyps: Secondary | ICD-10-CM

## 2011-09-06 MED ORDER — MOVIPREP 100 G PO SOLR: ORAL | Status: DC

## 2011-09-20 ENCOUNTER — Encounter: Payer: Self-pay | Admitting: Gastroenterology

## 2011-09-20 ENCOUNTER — Ambulatory Visit (AMBULATORY_SURGERY_CENTER): Payer: PRIVATE HEALTH INSURANCE | Admitting: Gastroenterology

## 2011-09-20 VITALS — BP 143/76 | HR 76 | Temp 97.9°F | Resp 20 | Ht 71.0 in | Wt 240.0 lb

## 2011-09-20 DIAGNOSIS — D126 Benign neoplasm of colon, unspecified: Secondary | ICD-10-CM

## 2011-09-20 DIAGNOSIS — Z1211 Encounter for screening for malignant neoplasm of colon: Secondary | ICD-10-CM

## 2011-09-20 DIAGNOSIS — Z86010 Personal history of colonic polyps: Secondary | ICD-10-CM

## 2011-09-20 LAB — GLUCOSE, CAPILLARY
Glucose-Capillary: 90 mg/dL (ref 70–99)
Glucose-Capillary: 76 mg/dL (ref 70–99)
Glucose-Capillary: 114 mg/dL — ABNORMAL HIGH (ref 70–99)

## 2011-09-20 MED ORDER — SODIUM CHLORIDE 0.9 % IV SOLN: 500.0000 mL | INTRAVENOUS | Status: DC

## 2011-09-20 NOTE — Progress Notes (Signed)
Oral airway and suctioned used per CRNA for tongue occluding airway.

## 2011-09-20 NOTE — Op Note (Signed)
[facility] [address] [place], [address]   COLONOSCOPY PROCEDURE REPORT PATIENT: Shane [name], Shane [name]  MR#: [id number] BIRTHDATE: 10-07-1950 , 61  yrs. old GENDER: Male ENDOSCOPIST: Meryl Dare, MD, [facility] REFERRED BY: PROCEDURE DATE:  09/20/2011 PROCEDURE:   Colonoscopy with snare polypectomy ASA CLASS:   Class II INDICATIONS: patient's personal history of multiple adenomatous colon polyps, 02/2007 MEDICATIONS: MAC sedation, administered by CRNA, Propofol (Diprivan), and propofol (Diprivan) 350mg  IV DESCRIPTION OF PROCEDURE:   After the risks benefits and alternatives of the procedure were thoroughly explained, informed consent was obtained.  A digital rectal exam revealed no abnormalities of the rectum.   The LB [id number]  endoscope was introduced through the anus and advanced to the cecum, which was identified by both the appendix and ileocecal valve. No adverse events experienced.   The quality of the prep was good, using MoviPrep  The instrument was then slowly withdrawn as the colon was fully examined.   COLON FINDINGS: Three sessile polyps measuring 5-6 mm in size were found in the transverse colon.  A polypectomy was performed with a cold snare.  The resection was complete and the polyp tissue was completely retrieved.   Three sessile polyps measuring 6-7 mm in size were found in the sigmoid colon.  A polypectomy was performed with a cold snare.  The resection was complete and the polyp tissue was completely retrieved.   Mild diverticulosis was noted in the ascending colon and transverse colon.   Moderate diverticulosis was noted in the sigmoid colon and descending colon.   The colon was otherwise normal.  There was no diverticulosis, inflamation, polyps or cancers unless previously stated.  Retroflexed views revealed internal hemorrhoids. The time to cecum=1 minutes 20 seconds. Withdrawal time=13 minutes 00 seconds.  The scope was withdrawn  and the procedure completed. COMPLICATIONS: There were no complications. ENDOSCOPIC IMPRESSION: 1.   Three sessile polyps in the transverse colon; polypectomy was performed with a cold snare 2.   Three sessile polyps in the sigmoid colon; polypectomy was performed with a cold snare 3.   Mild diverticulosis was noted in the ascending colon and transverse colon 4.   Moderate diverticulosis was noted in the sigmoid colon and descending colon 5.   Internal hemorrhoids  RECOMMENDATIONS: 1.  await pathology results 2.  High fiber diet with liberal fluid intake. 3.  repeat Colonoscopy in 3 years if 3 or more polyps are adenomatous, otherwise 5 years.  eSigned:  Meryl Dare, MD, [facility] 09/20/2011 11:58 AM

## 2011-09-20 NOTE — Progress Notes (Signed)
Pt states that he ate solid food yesterday 9/10 at 1700.  Reports that stools are all liquid greenish color.

## 2011-09-20 NOTE — Progress Notes (Signed)
Patient did not experience any of the following events: a burn prior to discharge; a fall within the facility; wrong site/side/patient/procedure/implant event; or a hospital transfer or hospital admission upon discharge from the facility. (G8907) Patient did not have preoperative order for IV antibiotic SSI prophylaxis. (G8918)  

## 2011-09-20 NOTE — Patient Instructions (Addendum)
YOU HAD AN ENDOSCOPIC PROCEDURE TODAY AT THE [facility]: Refer to the procedure report that was given to you for any specific questions about what was found during the examination.  If the procedure report does not answer your questions, please call your gastroenterologist to clarify.  If you requested that your care partner not be given the details of your procedure findings, then the procedure report has been included in a sealed envelope for you to review at your convenience later.  YOU SHOULD EXPECT: Some feelings of bloating in the abdomen. Passage of more gas than usual.  Walking can help get rid of the air that was put into your GI tract during the procedure and reduce the bloating. If you had a lower endoscopy (such as a colonoscopy or flexible sigmoidoscopy) you may notice spotting of blood in your stool or on the toilet paper. If you underwent a bowel prep for your procedure, then you may not have a normal bowel movement for a few days.  DIET: Your first meal following the procedure should be a light meal and then it is ok to progress to your normal diet.  A half-sandwich or bowl of soup is an example of a good first meal.  Heavy or fried foods are harder to digest and may make you feel nauseous or bloated.  Likewise meals heavy in dairy and vegetables can cause extra gas to form and this can also increase the bloating.  Drink plenty of fluids but you should avoid alcoholic beverages for 24 hours.  ACTIVITY: Your care partner should take you home directly after the procedure.  You should plan to take it easy, moving slowly for the rest of the day.  You can resume normal activity the day after the procedure however you should NOT DRIVE or use heavy machinery for 24 hours (because of the sedation medicines used during the test).    SYMPTOMS TO REPORT IMMEDIATELY: A gastroenterologist can be reached at any hour.  During normal business hours, 8:30 AM to 5:00 PM Monday through Friday,  call [phone].  After hours and on weekends, please call the GI answering service at [phone] who will take a message and have the physician on call contact you.   Following lower endoscopy (colonoscopy or flexible sigmoidoscopy):  Excessive amounts of blood in the stool  Significant tenderness or worsening of abdominal pains  Swelling of the abdomen that is new, acute  Fever of 100F or higher    FOLLOW UP: If any biopsies were taken you will be contacted by phone or by letter within the next 1-3 weeks.  Call your gastroenterologist if you have not heard about the biopsies in 3 weeks.  Our staff will call the home number listed on your records the next business day following your procedure to check on you and address any questions or concerns that you may have at that time regarding the information given to you following your procedure. This is a courtesy call and so if there is no answer at the home number and we have not heard from you through the emergency physician on call, we will assume that you have returned to your regular daily activities without incident.  SIGNATURES/CONFIDENTIALITY: You and/or your care partner have signed paperwork which will be entered into your electronic medical record.  These signatures attest to the fact that that the information above on your After Visit Summary has been reviewed and is understood.  Full responsibility of the confidentiality   of this discharge information lies with you and/or your care-partner.     

## 2011-09-21 ENCOUNTER — Telehealth: Payer: Self-pay | Admitting: *Deleted

## 2011-09-21 NOTE — Telephone Encounter (Signed)
  Follow up Call-  Call back number 09/20/2011  Post procedure Call Back phone  # [phone] or [phone] (wife's cell phone)  Permission to leave phone message Yes     Patient questions:  Do you have a fever, pain , or abdominal swelling? no Pain Score  0 *  Have you tolerated food without any problems? yes  Have you been able to return to your normal activities? yes  Do you have any questions about your discharge instructions: Diet   no Medications  no Follow up visit  no  Do you have questions or concerns about your Care? no  Actions: * If pain score is 4 or above: No action needed, pain <4.

## 2011-09-25 ENCOUNTER — Encounter: Payer: Self-pay | Admitting: Gastroenterology

## 2011-10-26 ENCOUNTER — Other Ambulatory Visit: Payer: Self-pay | Admitting: Endocrinology

## 2012-08-08 ENCOUNTER — Encounter: Payer: PRIVATE HEALTH INSURANCE | Admitting: Endocrinology

## 2012-10-27 ENCOUNTER — Ambulatory Visit: Payer: PRIVATE HEALTH INSURANCE

## 2012-10-27 ENCOUNTER — Ambulatory Visit (INDEPENDENT_AMBULATORY_CARE_PROVIDER_SITE_OTHER): Payer: PRIVATE HEALTH INSURANCE | Admitting: Family Medicine

## 2012-10-27 VITALS — BP 170/98 | HR 95 | Temp 98.4°F | Resp 16 | Ht 71.0 in | Wt 253.0 lb

## 2012-10-27 DIAGNOSIS — M25519 Pain in unspecified shoulder: Secondary | ICD-10-CM

## 2012-10-27 DIAGNOSIS — S42301A Unspecified fracture of shaft of humerus, right arm, initial encounter for closed fracture: Secondary | ICD-10-CM

## 2012-10-27 DIAGNOSIS — M25511 Pain in right shoulder: Secondary | ICD-10-CM

## 2012-10-27 DIAGNOSIS — S42309A Unspecified fracture of shaft of humerus, unspecified arm, initial encounter for closed fracture: Secondary | ICD-10-CM

## 2012-10-27 MED ORDER — HYDROCODONE-ACETAMINOPHEN 5-325 MG PO TABS: 1.0000 | ORAL_TABLET | Freq: Four times a day (QID) | ORAL | Status: DC | PRN

## 2012-10-27 NOTE — Patient Instructions (Signed)
Humerus Fracture, Treated with Immobilization  The humerus is the large bone in your upper arm. You have a broken (fractured) humerus. These fractures are easily diagnosed with X-rays.  TREATMENT   Simple fractures which will heal without disability are treated with simple immobilization. Immobilization means you will wear a cast, splint, or sling. You have a fracture which will do well with immobilization. The fracture will heal well simply by being held in a good position until it is stable enough to begin range of motion exercises. Do not take part in activities which would further injure your arm.   HOME CARE INSTRUCTIONS    Put ice on the injured area.   Put ice in a plastic bag.   Place a towel between your skin and the bag.   Leave the ice on for 15-20 minutes, 3-4 times a day.   If you have a cast:   Do not scratch the skin under the cast using sharp or pointed objects.   Check the skin around the cast every day. You may put lotion on any red or sore areas.   Keep your cast dry and clean.   If you have a splint:   Wear the splint as directed.   Keep your splint dry and clean.   You may loosen the elastic around the splint if your fingers become numb, tingle, or turn cold or blue.   If you have a sling:   Wear the sling as directed.   Do not put pressure on any part of your cast or splint until it is fully hardened.   Your cast or splint can be protected during bathing with a plastic bag. Do not lower the cast or splint into water.   Only take over-the-counter or prescription medicines for pain, discomfort, or fever as directed by your caregiver.   Do range of motion exercises as instructed by your caregiver.   Follow up as directed by your caregiver. This is very important in order to avoid permanent injury or disability and chronic pain.  SEEK IMMEDIATE MEDICAL CARE IF:    Your skin or nails in the injured arm turn blue or gray.   Your arm feels cold or numb.   You develop severe pain  in the injured arm.   You are having problems with the medicines you were given.  MAKE SURE YOU:    Understand these instructions.   Will watch your condition.   Will get help right away if you are not doing well or get worse.  Document Released: 04/03/2000 Document Revised: 03/20/2011 Document Reviewed: 02/09/2010  [employer] Patient Information 2014 [employer]

## 2012-10-27 NOTE — Progress Notes (Signed)
This chart was scribed for Shane Chick, MD by Caryn Bee, Medical Scribe. This patient was seen in Room12 and the patient's care was started at 1:02 PM.  Subjective:    Patient ID: Shane [name], male    DOB: 23-Jan-1950, 62 y.o.   MRN: [id number]  HPI HPI Comments: Shane [name] is a 62 y.o. male who presents to [facility] complaining of sudden onset constant right shoulder pain onset after falling on it last night. Pt states that he slipped and fell at his shop. He cannot move his arm without pain. Pt had surgery a few years ago on his right shoulder. He has taken advil last night for pain with mild relief. He also has associated tingling sensation in his right pinky. Pt denies neck pain, SOB, weakness. Pt's PCP is Dr. Everardo All. 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   Review of Systems  Respiratory: Negative for shortness of breath.   Musculoskeletal: Positive for arthralgias (Right shoulder). Negative for neck pain.  Neurological: Positive for numbness. Negative for weakness.    Past Medical History  Diagnosis Date  . DIABETES MELLITUS, TYPE II 09/25/2006    Qualifier: Diagnosis of  By: Everardo All MD, Cleophas Dunker   . HYPERCHOLESTEROLEMIA 01/22/2007    Qualifier: Diagnosis of  By: Francie Massing    . HYPERTENSION 08/24/2006    Qualifier: Diagnosis of  By: Everardo All MD, Sean A   . PSA, INCREASED 11/30/2009    Qualifier: Diagnosis of  By: Everardo All MD, Cleophas Dunker RENAL INSUFFICIENCY 09/25/2006    Qualifier: Diagnosis of  By: Everardo All MD, Sean A   . INSOMNIA 09/10/2008    Qualifier: Diagnosis of  By: Everardo All MD, Cleophas Dunker Colon polyps 06/17/2011  . Diverticulosis 06/17/2011  . CIGARETTE SMOKER 11/30/2009    Qualifier: Diagnosis of  By: Everardo All MD, Cleophas Dunker    History   Social History  . Marital Status: Married    Spouse Name: N/A    Number of Children: N/A  . Years of Education: N/A   Occupational History  . [employer]    Social History Main Topics  . Smoking status: Current Every Day  Smoker -- 1.00 packs/day for 40 years    Types: Cigarettes  . Smokeless tobacco: Never Used  . Alcohol Use: 7.2 oz/week    12 Cans of beer per week  . Drug Use: No  . Sexual Activity: Not on file   Other Topics Concern  . 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 Hernia repair    . Tonsillectomy    . Colonoscopy    . Polypectomy     Family History  Problem Relation Age of Onset  . Cancer Father   . Hypertension Father   . Heart disease Mother   . Heart disease Sister       Objective:   Physical Exam  Nursing note and vitals reviewed. Constitutional: He is oriented to person, place, and time. He appears well-developed and well-nourished. No distress.  HENT:  Head: Normocephalic and atraumatic.  Eyes: Conjunctivae are normal. Pupils are equal, round, and reactive to light.  Neck: Normal range of motion. Neck supple. No spinous process tenderness and no muscular tenderness present. Normal range of motion present.  Cervical spine non tender. FROM in cervical spine without pain or limitation.   Cardiovascular: Normal rate, regular rhythm and  normal heart sounds.  Exam reveals no gallop and no friction rub.   No murmur heard. Pulmonary/Chest: Effort normal and breath sounds normal. No respiratory distress. He has no wheezes. He has no rales. He exhibits no tenderness.  Musculoskeletal: He exhibits edema and tenderness.  Right clavicle non tender to palpation. Right anterior chest non tender to palpation. Right deltoid tender to palpation. Right proximal humerus tender to palpation. Pain with elevation at 10 degrees of right shoulder. Grip of right hand 5/5. Radial pulse 2+. Capillary refill less than 3 seconds. Sensation intact right arm.  Neurological: He is alert and oriented to person, place, and time.  Skin: Skin is warm and dry. No rash noted. He is not diaphoretic.   Psychiatric: He has a normal mood and affect.   [facility] x-ray reading by Dr. Katrinka Blazing Right shoulder films positive non displaced humerus fracture proximal.       Assessment & Plan:   The primary encounter diagnosis was Right shoulder pain. A diagnosis of Fracture, humerus, right, closed, initial encounter was also pertinent to this visit.  1.  Pain R shoulder:  New. Secondary to humerus fracture; rx for Hydrocodone provided. 2.  R humeral neck fracture: New. Sling placed in office. Refer to ortho for management.  Meds ordered this encounter  Medications  . HYDROcodone-acetaminophen (NORCO/VICODIN) 5-325 MG per tablet    Sig: Take 1-2 tablets by mouth every 6 (six) hours as needed for pain.    Dispense:  45 tablet    Refill:  0   I personally performed the services described in this documentation, which was scribed in my presence.  The recorded information has been reviewed and is accurate.   Nilda Simmer, M.D.  [facility] [address] [place], [place]  [address] [phone] phone [phone] fax

## 2013-02-10 ENCOUNTER — Ambulatory Visit (INDEPENDENT_AMBULATORY_CARE_PROVIDER_SITE_OTHER): Payer: Medicare HMO | Admitting: Endocrinology

## 2013-02-10 ENCOUNTER — Encounter: Payer: Self-pay | Admitting: Endocrinology

## 2013-02-10 VITALS — BP 130/84 | HR 95 | Temp 98.4°F | Ht 71.0 in | Wt 255.0 lb

## 2013-02-10 DIAGNOSIS — Z79899 Other long term (current) drug therapy: Secondary | ICD-10-CM

## 2013-02-10 DIAGNOSIS — E538 Deficiency of other specified B group vitamins: Secondary | ICD-10-CM

## 2013-02-10 DIAGNOSIS — E119 Type 2 diabetes mellitus without complications: Secondary | ICD-10-CM

## 2013-02-10 DIAGNOSIS — Z0000 Encounter for general adult medical examination without abnormal findings: Secondary | ICD-10-CM

## 2013-02-10 DIAGNOSIS — N259 Disorder resulting from impaired renal tubular function, unspecified: Secondary | ICD-10-CM

## 2013-02-10 DIAGNOSIS — R972 Elevated prostate specific antigen [PSA]: Secondary | ICD-10-CM

## 2013-02-10 DIAGNOSIS — E7800 Pure hypercholesterolemia, unspecified: Secondary | ICD-10-CM

## 2013-02-10 DIAGNOSIS — E1149 Type 2 diabetes mellitus with other diabetic neurological complication: Secondary | ICD-10-CM

## 2013-02-10 DIAGNOSIS — I10 Essential (primary) hypertension: Secondary | ICD-10-CM

## 2013-02-10 DIAGNOSIS — S42209A Unspecified fracture of upper end of unspecified humerus, initial encounter for closed fracture: Secondary | ICD-10-CM

## 2013-02-10 LAB — MICROALBUMIN / CREATININE URINE RATIO
CREATININE, U: 163.6 mg/dL
MICROALB/CREAT RATIO: 21.2 mg/g (ref 0.0–30.0)
Microalb, Ur: 34.7 mg/dL — ABNORMAL HIGH (ref 0.0–1.9)

## 2013-02-10 LAB — LIPID PANEL
CHOL/HDL RATIO: 6
CHOLESTEROL: 231 mg/dL — AB (ref 0–200)
HDL: 37.7 mg/dL — ABNORMAL LOW (ref >39.00)
Triglycerides: 117 mg/dL (ref 0.0–149.0)
VLDL: 23.4 mg/dL (ref 0.0–40.0)

## 2013-02-10 LAB — URINALYSIS, ROUTINE W REFLEX MICROSCOPIC
Bilirubin Urine: NEGATIVE
Hgb urine dipstick: NEGATIVE
Ketones, ur: NEGATIVE
Leukocytes, UA: NEGATIVE
NITRITE: NEGATIVE
RBC / HPF: NONE SEEN (ref 0–?)
Total Protein, Urine: 30 — AB
URINE GLUCOSE: NEGATIVE
Urobilinogen, UA: 0.2 (ref 0.0–1.0)
pH: 5.5 (ref 5.0–8.0)

## 2013-02-10 LAB — LDL CHOLESTEROL, DIRECT: LDL DIRECT: 184.4 mg/dL

## 2013-02-10 LAB — HEPATIC FUNCTION PANEL
ALT: 21 U/L (ref 0–53)
AST: 22 U/L (ref 0–37)
Albumin: 3.9 g/dL (ref 3.5–5.2)
Alkaline Phosphatase: 105 U/L (ref 39–117)
BILIRUBIN DIRECT: 0.1 mg/dL (ref 0.0–0.3)
BILIRUBIN TOTAL: 0.8 mg/dL (ref 0.3–1.2)
TOTAL PROTEIN: 7.3 g/dL (ref 6.0–8.3)

## 2013-02-10 LAB — CBC WITH DIFFERENTIAL/PLATELET
BASOS PCT: 0.5 % (ref 0.0–3.0)
Basophils Absolute: 0 10*3/uL (ref 0.0–0.1)
EOS PCT: 4.7 % (ref 0.0–5.0)
Eosinophils Absolute: 0.5 10*3/uL (ref 0.0–0.7)
HCT: 48.1 % (ref 39.0–52.0)
Hemoglobin: 15.4 g/dL (ref 13.0–17.0)
Lymphocytes Relative: 15.7 % (ref 12.0–46.0)
Lymphs Abs: 1.5 10*3/uL (ref 0.7–4.0)
MCHC: 31.9 g/dL (ref 30.0–36.0)
MCV: 82.8 fl (ref 78.0–100.0)
MONO ABS: 0.9 10*3/uL (ref 0.1–1.0)
Monocytes Relative: 9.6 % (ref 3.0–12.0)
Neutro Abs: 6.7 10*3/uL (ref 1.4–7.7)
Neutrophils Relative %: 69.5 % (ref 43.0–77.0)
Platelets: 203 10*3/uL (ref 150.0–400.0)
RBC: 5.81 Mil/uL (ref 4.22–5.81)
RDW: 15.3 % — ABNORMAL HIGH (ref 11.5–14.6)
WBC: 9.6 10*3/uL (ref 4.5–10.5)

## 2013-02-10 LAB — BASIC METABOLIC PANEL
BUN: 12 mg/dL (ref 6–23)
CALCIUM: 9.1 mg/dL (ref 8.4–10.5)
CO2: 22 meq/L (ref 19–32)
CREATININE: 1.2 mg/dL (ref 0.4–1.5)
Chloride: 106 mEq/L (ref 96–112)
GFR: 81.27 mL/min (ref >60.00)
Glucose, Bld: 92 mg/dL (ref 70–99)
Potassium: 4.3 mEq/L (ref 3.5–5.1)
Sodium: 139 mEq/L (ref 135–145)

## 2013-02-10 LAB — VITAMIN B12: Vitamin B-12: 199 pg/mL — ABNORMAL LOW (ref 211–911)

## 2013-02-10 LAB — PSA: PSA: 5.53 ng/mL — AB (ref 0.10–4.00)

## 2013-02-10 LAB — HEMOGLOBIN A1C: Hgb A1c MFr Bld: 5.9 % (ref 4.6–6.5)

## 2013-02-10 LAB — TSH: TSH: 1.18 u[IU]/mL (ref 0.35–5.50)

## 2013-02-10 MED ORDER — ATORVASTATIN CALCIUM 80 MG PO TABS: 80.0000 mg | ORAL_TABLET | Freq: Every day | ORAL | Status: DC

## 2013-02-10 MED ORDER — HYDROCHLOROTHIAZIDE 12.5 MG PO CAPS
ORAL_CAPSULE | ORAL | Status: DC
Start: 2013-02-10 — End: 2013-08-18

## 2013-02-10 NOTE — Patient Instructions (Addendum)
please consider these measures for your health:  minimize alcohol.  do not use tobacco products.  have a colonoscopy at least every 10 years from age 65.  keep firearms safely stored.  always use seat belts.  have working smoke alarms in your home.  see an eye doctor and dentist regularly.  never drive under the influence of alcohol or drugs (including prescription drugs).   blood tests are being requested for you today.  We'll contact you with results. it is critically important to prevent falling down (keep floor areas well-lit, dry, and free of loose objects.  If you have a cane, walker, or wheelchair, you should use it, even for short trips around the house.  Also, try not to rush). Please come back for a follow-up appointment in 6 months.

## 2013-02-10 NOTE — Progress Notes (Signed)
Subjective:    Patient ID: Shane [name], male    DOB: [birth year], 63 y.o.   MRN: [id number]  HPI Pt is here for regular wellness examination, and is feeling pretty well in general, and says chronic med probs are stable, except as noted below Past Medical History  Diagnosis Date  . DIABETES MELLITUS, TYPE II 09/25/2006    Qualifier: Diagnosis of  By: Loanne Drilling MD, Jacelyn Pi   . HYPERCHOLESTEROLEMIA 01/22/2007    Qualifier: Diagnosis of  By: Harvel Ricks    . HYPERTENSION 08/24/2006    Qualifier: Diagnosis of  By: Loanne Drilling MD, Liller Yohn A   . PSA, INCREASED 11/30/2009    Qualifier: Diagnosis of  By: Loanne Drilling MD, Jacelyn Pi RENAL INSUFFICIENCY 09/25/2006    Qualifier: Diagnosis of  By: Loanne Drilling MD, Janneth Krasner A   . INSOMNIA 09/10/2008    Qualifier: Diagnosis of  By: Loanne Drilling MD, Jacelyn Pi Colon polyps 06/17/2011  . Diverticulosis 06/17/2011  . CIGARETTE SMOKER 11/30/2009    Qualifier: Diagnosis of  By: Loanne Drilling MD, Jacelyn Pi     Past Surgical History  Procedure Laterality Date  . Hernia repair    . Tonsillectomy    . Colonoscopy    . Polypectomy      History   Social History  . Marital Status: Married    Spouse Name: N/A    Number of Children: N/A  . Years of Education: N/A   Occupational History  . [employer]    Social History Main Topics  . Smoking status: Current Every Day Smoker -- 1.00 packs/day for 40 years    Types: Cigarettes  . Smokeless tobacco: Never Used  . Alcohol Use: 7.2 oz/week    12 Cans of beer per week  . Drug Use: No  . Sexual Activity: Not on file   Other Topics Concern  . 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 metFORMIN (GLUCOPHAGE-XR) 500 MG 24 hr tablet Take 1 tablet (500 mg total) by mouth daily.  90 tablet  2  . NON FORMULARY Garlic-Take one daily      . omeprazole (PRILOSEC) 20 MG capsule Take 20 mg by mouth  daily.      . traZODone (DESYREL) 150 MG tablet TAKE ONE TABLET BY MOUTH AT BEDTIME  90 tablet  2   No current facility-administered medications on file prior to visit.    No Known Allergies  Family History  Problem Relation Age of Onset  . Cancer Father   . Hypertension Father   . Heart disease Mother   . Heart disease Sister     BP 130/84  Pulse 95  Temp(Src) 98.4 F (36.9 C) (Oral)  Ht 5\' 11"  (1.803 m)  Wt 255 lb (115.667 kg)  BMI 35.58 kg/m2  SpO2 95%  Review of Systems  Constitutional: Negative for fever.  HENT: Negative for hearing loss.   Eyes: Negative for visual disturbance.  Respiratory: Negative for shortness of breath.   Cardiovascular: Negative for chest pain.  Gastrointestinal: Negative for anal bleeding.  Endocrine: Negative for cold intolerance.  Genitourinary: Negative for hematuria.  Musculoskeletal: Negative for back pain.  Skin: Negative for rash.  Allergic/Immunologic: Negative for environmental allergies.  Neurological: Negative for syncope.  Hematological: Does not bruise/bleed easily.  Psychiatric/Behavioral: Negative for dysphoric mood.  Objective:   Physical Exam VS: see vs page GEN: no distress HEAD: head: no deformity eyes: no periorbital swelling, no proptosis external nose and ears are normal mouth: no lesion seen NECK: supple, thyroid is not enlarged CHEST WALL: no deformity LUNGS: clear to auscultation BREASTS:  No gynecomastia CV: reg rate and rhythm, no murmur ABD: abdomen is soft, nontender.  no hepatosplenomegaly.  not distended.  no hernia GENITALIA/RECTAL/PROSTATE:  sees urology MUSCULOSKELETAL: muscle bulk and strength are grossly normal.  no obvious joint swelling.  gait is normal and steady PULSES: no carotid bruit NEURO:  cn 2-12 grossly intact.   readily moves all 4's.  SKIN:  Normal texture and temperature.  No rash or suspicious lesion is visible.   NODES:  None palpable at the neck PSYCH: alert,  well-oriented.  Does not appear anxious nor depressed.      Assessment & Plan:  Wellness visit today, with problems stable, except as noted.  we discussed code status.  pt requests full code, but would not want to be started or maintained on artificial life-support measures if there was not a reasonable chance of recovery.     SEPARATE EVALUATION FOLLOWS--EACH PROBLEM HERE IS NEW, NOT RESPONDING TO TREATMENT, OR POSES SIGNIFICANT RISK TO THE PATIENT'S HEALTH: HISTORY OF THE PRESENT ILLNESS: Pt returns for f/u of type 2 DM.  He has not recently taken any meds. Dyslipidemia: pt has not recently taken lipitor. Elevated PSA: he is overdue for urol f/u PAST MEDICAL HISTORY reviewed and up to date today REVIEW OF SYSTEMS: He has weight gain.  He has slight numbness of the feet PHYSICAL EXAMINATION: VITAL SIGNS:  See vs page GENERAL: no distress LAB/XRAY RESULTS: Lab Results  Component Value Date   WBC 9.6 02/10/2013   HGB 15.4 02/10/2013   HCT 48.1 02/10/2013   PLT 203.0 02/10/2013   GLUCOSE 92 02/10/2013   CHOL 231* 02/10/2013   TRIG 117.0 02/10/2013   HDL 37.70* 02/10/2013   LDLDIRECT 184.4 02/10/2013   LDLCALC 84 06/02/2008   ALT 21 02/10/2013   AST 22 02/10/2013   NA 139 02/10/2013   K 4.3 02/10/2013   CL 106 02/10/2013   CREATININE 1.2 02/10/2013   BUN 12 02/10/2013   CO2 22 02/10/2013   TSH 1.18 02/10/2013   PSA 5.53* 02/10/2013   HGBA1C 5.9 02/10/2013   MICROALBUR 34.7* 02/10/2013  B-12=199 IMPRESSION: Elevated PSA, persistent. Dyslipidemia: therapy limited by noncompliance.  i'll do the best i can. DM: he does not need any med for now.   Vit b-12 deficiency, new PLAN: See instruction page He is ref back to urol for f/u

## 2013-02-11 ENCOUNTER — Ambulatory Visit (INDEPENDENT_AMBULATORY_CARE_PROVIDER_SITE_OTHER): Payer: Medicare HMO

## 2013-02-11 DIAGNOSIS — E538 Deficiency of other specified B group vitamins: Secondary | ICD-10-CM

## 2013-02-11 MED ORDER — CYANOCOBALAMIN 1000 MCG/ML IJ SOLN
1000.0000 ug | Freq: Once | INTRAMUSCULAR | Status: AC
  Administered 2013-02-11: 1000 ug via INTRAMUSCULAR

## 2013-03-11 ENCOUNTER — Ambulatory Visit (INDEPENDENT_AMBULATORY_CARE_PROVIDER_SITE_OTHER): Payer: Medicare HMO | Admitting: *Deleted

## 2013-03-11 DIAGNOSIS — E538 Deficiency of other specified B group vitamins: Secondary | ICD-10-CM

## 2013-03-11 DIAGNOSIS — Z23 Encounter for immunization: Secondary | ICD-10-CM

## 2013-03-11 MED ORDER — PNEUMOCOCCAL VAC POLYVALENT 25 MCG/0.5ML IJ INJ
0.5000 mL | INJECTION | INTRAMUSCULAR | Status: AC
  Administered 2013-04-10: 0.5 mL via INTRAMUSCULAR

## 2013-03-11 MED ORDER — CYANOCOBALAMIN 1000 MCG/ML IJ SOLN
1000.0000 ug | Freq: Once | INTRAMUSCULAR | Status: AC
  Administered 2013-03-11: 1000 ug via INTRAMUSCULAR

## 2013-04-10 ENCOUNTER — Ambulatory Visit (INDEPENDENT_AMBULATORY_CARE_PROVIDER_SITE_OTHER): Payer: Medicare HMO

## 2013-04-10 ENCOUNTER — Other Ambulatory Visit: Payer: Self-pay

## 2013-04-10 DIAGNOSIS — E538 Deficiency of other specified B group vitamins: Secondary | ICD-10-CM

## 2013-04-10 MED ORDER — METFORMIN HCL ER 500 MG PO TB24: 500.0000 mg | ORAL_TABLET | Freq: Every day | ORAL | Status: DC

## 2013-04-10 MED ORDER — CYANOCOBALAMIN 1000 MCG/ML IJ SOLN
1000.0000 ug | Freq: Once | INTRAMUSCULAR | Status: AC
  Administered 2013-04-10: 1000 ug via INTRAMUSCULAR

## 2013-04-11 ENCOUNTER — Ambulatory Visit: Payer: Medicare HMO

## 2013-05-12 ENCOUNTER — Ambulatory Visit (INDEPENDENT_AMBULATORY_CARE_PROVIDER_SITE_OTHER): Payer: Medicare HMO

## 2013-05-12 DIAGNOSIS — E538 Deficiency of other specified B group vitamins: Secondary | ICD-10-CM

## 2013-05-12 MED ORDER — CYANOCOBALAMIN 1000 MCG/ML IJ SOLN
1000.0000 ug | Freq: Once | INTRAMUSCULAR | Status: AC
  Administered 2013-05-12: 1000 ug via INTRAMUSCULAR

## 2013-06-12 ENCOUNTER — Ambulatory Visit: Payer: Medicare HMO

## 2013-06-16 ENCOUNTER — Ambulatory Visit (INDEPENDENT_AMBULATORY_CARE_PROVIDER_SITE_OTHER): Payer: Medicare HMO | Admitting: Endocrinology

## 2013-06-16 DIAGNOSIS — E538 Deficiency of other specified B group vitamins: Secondary | ICD-10-CM

## 2013-06-16 MED ORDER — CYANOCOBALAMIN 1000 MCG/ML IJ SOLN
1000.0000 ug | Freq: Once | INTRAMUSCULAR | Status: AC
  Administered 2013-06-16: 1000 ug via INTRAMUSCULAR

## 2013-06-16 MED ORDER — CYANOCOBALAMIN 1000 MCG/ML IJ SOLN: 1000.0000 ug | Freq: Once | INTRAMUSCULAR | Status: DC

## 2013-06-16 NOTE — Progress Notes (Signed)
   Subjective:    Patient ID: Shane [name], male    DOB: 12/04/1950, 63 y.o.   MRN: [id number]  HPI Injection only.   Review of Systems     Objective:   Physical Exam        Assessment & Plan:

## 2013-07-16 ENCOUNTER — Ambulatory Visit (INDEPENDENT_AMBULATORY_CARE_PROVIDER_SITE_OTHER): Payer: Medicare HMO

## 2013-07-16 DIAGNOSIS — E538 Deficiency of other specified B group vitamins: Secondary | ICD-10-CM

## 2013-07-16 MED ORDER — CYANOCOBALAMIN 1000 MCG/ML IJ SOLN
1000.0000 ug | Freq: Once | INTRAMUSCULAR | Status: AC
  Administered 2013-07-16: 1000 ug via INTRAMUSCULAR

## 2013-08-18 ENCOUNTER — Ambulatory Visit (INDEPENDENT_AMBULATORY_CARE_PROVIDER_SITE_OTHER): Payer: Medicare HMO | Admitting: Endocrinology

## 2013-08-18 ENCOUNTER — Encounter: Payer: Self-pay | Admitting: Endocrinology

## 2013-08-18 VITALS — BP 134/80 | HR 91 | Temp 97.9°F | Ht 71.0 in | Wt 257.0 lb

## 2013-08-18 DIAGNOSIS — R972 Elevated prostate specific antigen [PSA]: Secondary | ICD-10-CM

## 2013-08-18 DIAGNOSIS — N259 Disorder resulting from impaired renal tubular function, unspecified: Secondary | ICD-10-CM

## 2013-08-18 DIAGNOSIS — E7800 Pure hypercholesterolemia, unspecified: Secondary | ICD-10-CM

## 2013-08-18 DIAGNOSIS — N529 Male erectile dysfunction, unspecified: Secondary | ICD-10-CM | POA: Insufficient documentation

## 2013-08-18 DIAGNOSIS — E1149 Type 2 diabetes mellitus with other diabetic neurological complication: Secondary | ICD-10-CM

## 2013-08-18 LAB — HEMOGLOBIN A1C: HEMOGLOBIN A1C: 6.2 % (ref 4.6–6.5)

## 2013-08-18 LAB — BASIC METABOLIC PANEL
BUN: 15 mg/dL (ref 6–23)
CALCIUM: 9 mg/dL (ref 8.4–10.5)
CO2: 25 meq/L (ref 19–32)
Chloride: 102 mEq/L (ref 96–112)
Creatinine, Ser: 1.4 mg/dL (ref 0.4–1.5)
GFR: 63.73 mL/min (ref >60.00)
GLUCOSE: 88 mg/dL (ref 70–99)
POTASSIUM: 4.5 meq/L (ref 3.5–5.1)
SODIUM: 137 meq/L (ref 135–145)

## 2013-08-18 LAB — TESTOSTERONE: Testosterone: 260.86 ng/dL — ABNORMAL LOW (ref 300.00–890.00)

## 2013-08-18 LAB — LIPID PANEL
Cholesterol: 210 mg/dL — ABNORMAL HIGH (ref 0–200)
HDL: 32.7 mg/dL — ABNORMAL LOW (ref >39.00)
LDL Cholesterol: 153 mg/dL — ABNORMAL HIGH (ref 0–99)
NONHDL: 177.3
TRIGLYCERIDES: 120 mg/dL (ref 0.0–149.0)
Total CHOL/HDL Ratio: 6
VLDL: 24 mg/dL (ref 0.0–40.0)

## 2013-08-18 MED ORDER — HYDROCHLOROTHIAZIDE 12.5 MG PO CAPS: ORAL_CAPSULE | ORAL | Status: DC

## 2013-08-18 MED ORDER — OMEPRAZOLE 20 MG PO CPDR: 20.0000 mg | DELAYED_RELEASE_CAPSULE | Freq: Every day | ORAL | Status: DC

## 2013-08-18 MED ORDER — TRAZODONE HCL 150 MG PO TABS: ORAL_TABLET | ORAL | Status: DC

## 2013-08-18 NOTE — Progress Notes (Signed)
Subjective:    Patient ID: Shane [name], male    DOB: 10-Jul-1950, 63 y.o.   MRN: [id number]  HPI The state of at least three ongoing medical problems is addressed today, with interval history of each noted here: DM: he denies edema Dyslipidemia: he denies weight change.  He takes lipitor as rx'ed.   Renal insufficiency: he denies dysuria Past Medical History  Diagnosis Date  . DIABETES MELLITUS, TYPE II 09/25/2006    Qualifier: Diagnosis of  By: Loanne Drilling MD, Jacelyn Pi   . HYPERCHOLESTEROLEMIA 01/22/2007    Qualifier: Diagnosis of  By: Harvel Ricks    . HYPERTENSION 08/24/2006    Qualifier: Diagnosis of  By: Loanne Drilling MD, Carmelina Balducci A   . PSA, INCREASED 11/30/2009    Qualifier: Diagnosis of  By: Loanne Drilling MD, Jacelyn Pi RENAL INSUFFICIENCY 09/25/2006    Qualifier: Diagnosis of  By: Loanne Drilling MD, Marilynne Dupuis A   . INSOMNIA 09/10/2008    Qualifier: Diagnosis of  By: Loanne Drilling MD, Jacelyn Pi Colon polyps 06/17/2011  . Diverticulosis 06/17/2011  . CIGARETTE SMOKER 11/30/2009    Qualifier: Diagnosis of  By: Loanne Drilling MD, Jacelyn Pi     Past Surgical History  Procedure Laterality Date  . Hernia repair    . Tonsillectomy    . Colonoscopy    . Polypectomy      History   Social History  . Marital Status: Married    Spouse Name: N/A    Number of Children: N/A  . Years of Education: N/A   Occupational History  . [employer]    Social History Main Topics  . Smoking status: Current Every Day Smoker -- 1.00 packs/day for 40 years    Types: Cigarettes  . Smokeless tobacco: Never Used  . Alcohol Use: 7.2 oz/week    12 Cans of beer per week  . Drug Use: No  . Sexual Activity: Not on file   Other Topics Concern  . 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 atorvastatin (LIPITOR) 80 MG tablet Take 1 tablet (80 mg total) by mouth daily.  90 tablet  3  . metFORMIN  (GLUCOPHAGE-XR) 500 MG 24 hr tablet Take 1 tablet (500 mg total) by mouth daily.  90 tablet  2  . NON FORMULARY Garlic-Take one daily       No current facility-administered medications on file prior to visit.    No Known Allergies  Family History  Problem Relation Age of Onset  . Cancer Father   . Hypertension Father   . Heart disease Mother   . Heart disease Sister     BP 134/80  Pulse 91  Temp(Src) 97.9 F (36.6 C) (Oral)  Ht 5\' 11"  (1.803 m)  Wt 257 lb (116.574 kg)  BMI 35.86 kg/m2  SpO2 95%   Review of Systems Denies n/v/d.  He has ED sxs    Objective:   Physical Exam Pulses: dorsalis pedis intact bilat.   Feet: no deformity. normal color and temp.  no edema Skin:  no ulcer on the feet.   Neuro: sensation is intact to touch on the feet.   Lab Results  Component Value Date   CREATININE 1.4 08/18/2013   BUN 15 08/18/2013   NA 137 08/18/2013   K 4.5 08/18/2013   CL 102 08/18/2013  CO2 25 08/18/2013   Lab Results  Component Value Date   CHOL 210* 08/18/2013   HDL 32.70* 08/18/2013   LDLCALC 153* 08/18/2013   LDLDIRECT 184.4 02/10/2013   TRIG 120.0 08/18/2013   CHOLHDL 6 08/18/2013   Lab Results  Component Value Date   HGBA1C 6.2 08/18/2013   Lab Results  Component Value Date   TESTOSTERONE 260.86* 08/18/2013       Assessment & Plan:  DM: well-controlled Dyslipidemia: moderate exacerbation. Hypogonadism: new PSA: in this context, we can't rx the hypogonadism.  Patient is advised the following: Patient Instructions  blood tests are being requested for you today.  We'll contact you with results.  Even if the testosterone is low, we can't safely increase it, due to your high PSA.   Please come back for a regular physical appointment in 6 months (must be after 02/10/14).  Please continue the same metformin.  Your cholesterol is high. Please make sure you are taking the cholesterol pill.

## 2013-08-18 NOTE — Patient Instructions (Addendum)
blood tests are being requested for you today.  We'll contact you with results.  Even if the testosterone is low, we can't safely increase it, due to your high PSA.   Please come back for a regular physical appointment in 6 months (must be after 02/10/14).

## 2013-10-20 ENCOUNTER — Ambulatory Visit (INDEPENDENT_AMBULATORY_CARE_PROVIDER_SITE_OTHER): Payer: Medicare HMO | Admitting: Endocrinology

## 2013-10-20 ENCOUNTER — Encounter: Payer: Self-pay | Admitting: Endocrinology

## 2013-10-20 VITALS — BP 128/74 | HR 96 | Temp 97.9°F | Ht 71.0 in | Wt 256.0 lb

## 2013-10-20 DIAGNOSIS — E291 Testicular hypofunction: Secondary | ICD-10-CM

## 2013-10-20 DIAGNOSIS — N529 Male erectile dysfunction, unspecified: Secondary | ICD-10-CM

## 2013-10-20 DIAGNOSIS — N528 Other male erectile dysfunction: Secondary | ICD-10-CM

## 2013-10-20 DIAGNOSIS — Z23 Encounter for immunization: Secondary | ICD-10-CM

## 2013-10-20 MED ORDER — SILDENAFIL CITRATE 20 MG PO TABS: 20.0000 mg | ORAL_TABLET | Freq: Three times a day (TID) | ORAL | Status: DC

## 2013-10-20 MED ORDER — ATORVASTATIN CALCIUM 80 MG PO TABS: 80.0000 mg | ORAL_TABLET | Freq: Every day | ORAL | Status: DC

## 2013-10-20 NOTE — Patient Instructions (Signed)
i have sent a prescription to your pharmacy, to refill the lipitor. Please discuss the testosterone with Dr Janice Norrie, as he treats your prostate also.   Here is a prescription for the 20 mg generic of viagra

## 2013-10-20 NOTE — Progress Notes (Signed)
Subjective:    Patient ID: Shane [name], male    DOB: November 30, 1950, 63 y.o.   MRN: [id number]  HPI The state of at least three ongoing medical problems is addressed today, with interval history of each noted here: Hypogonadism: pt reports decreased libido.   Dyslipidemia: he denies weight change.  He says he needs new rx for lipitor.   Insomnia: pt says he has an incomplete response to the desyrel.   Past Medical History  Diagnosis Date  . DIABETES MELLITUS, TYPE II 09/25/2006    Qualifier: Diagnosis of  By: Loanne Drilling MD, Jacelyn Pi   . HYPERCHOLESTEROLEMIA 01/22/2007    Qualifier: Diagnosis of  By: Harvel Ricks    . HYPERTENSION 08/24/2006    Qualifier: Diagnosis of  By: Loanne Drilling MD, Forrest Demuro A   . PSA, INCREASED 11/30/2009    Qualifier: Diagnosis of  By: Loanne Drilling MD, Jacelyn Pi RENAL INSUFFICIENCY 09/25/2006    Qualifier: Diagnosis of  By: Loanne Drilling MD, Angus Amini A   . INSOMNIA 09/10/2008    Qualifier: Diagnosis of  By: Loanne Drilling MD, Jacelyn Pi Colon polyps 06/17/2011  . Diverticulosis 06/17/2011  . CIGARETTE SMOKER 11/30/2009    Qualifier: Diagnosis of  By: Loanne Drilling MD, Jacelyn Pi     Past Surgical History  Procedure Laterality Date  . Hernia repair    . Tonsillectomy    . Colonoscopy    . Polypectomy      History   Social History  . Marital Status: Married    Spouse Name: N/A    Number of Children: N/A  . Years of Education: N/A   Occupational History  . [employer]    Social History Main Topics  . Smoking status: Current Every Day Smoker -- 1.00 packs/day for 40 years    Types: Cigarettes  . Smokeless tobacco: Never Used  . Alcohol Use: 7.2 oz/week    12 Cans of beer per week  . Drug Use: No  . Sexual Activity: Not on file   Other Topics Concern  . 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 hydrochlorothiazide (MICROZIDE) 12.5 MG capsule  TAKE ONE CAPSULE BY MOUTH EVERY DAY  90 capsule  3  . metFORMIN (GLUCOPHAGE-XR) 500 MG 24 hr tablet Take 1 tablet (500 mg total) by mouth daily.  90 tablet  2  . NON FORMULARY Garlic-Take one daily      . omeprazole (PRILOSEC) 20 MG capsule Take 1 capsule (20 mg total) by mouth daily.  90 capsule  3  . traZODone (DESYREL) 150 MG tablet TAKE ONE TABLET BY MOUTH AT BEDTIME  90 tablet  2   No current facility-administered medications on file prior to visit.    No Known Allergies  Family History  Problem Relation Age of Onset  . Cancer Father   . Hypertension Father   . Heart disease Mother   . Heart disease Sister     BP 128/74  Pulse 96  Temp(Src) 97.9 F (36.6 C) (Oral)  Ht 5\' 11"  (1.803 m)  Wt 256 lb (116.121 kg)  BMI 35.72 kg/m2  SpO2 96%  Review of Systems Denies depression.  ED sxs persist.      Objective:   Physical Exam VITAL SIGNS:  See vs page GENERAL: no distress. Pulses: dorsalis pedis intact bilat.   Feet: no  deformity.  no edema. Skin:  no ulcer on the feet.  normal color and temp. Neuro: sensation is intact to touch on the feet.    .     Assessment & Plan:  Dyslipidemia: therapy limited by noncompliance.  i'll do the best i can Insomnia: mild exacerbation. Hypogonadism: rx of this needs to be weighed against the elevted PSA.  i told pt it is likely Dr Janice Norrie will advise against increasing the testosterone.  Patient is advised the following: Patient Instructions  i have sent a prescription to your pharmacy, to refill the lipitor. Please discuss the testosterone with Dr Janice Norrie, as he treats your prostate also.   Here is a prescription for the 20 mg generic of viagra

## 2014-01-10 ENCOUNTER — Other Ambulatory Visit: Payer: Self-pay | Admitting: Endocrinology

## 2014-01-13 DIAGNOSIS — N401 Enlarged prostate with lower urinary tract symptoms: Secondary | ICD-10-CM | POA: Diagnosis not present

## 2014-01-13 DIAGNOSIS — R350 Frequency of micturition: Secondary | ICD-10-CM | POA: Diagnosis not present

## 2014-01-13 DIAGNOSIS — R972 Elevated prostate specific antigen [PSA]: Secondary | ICD-10-CM | POA: Diagnosis not present

## 2014-02-10 ENCOUNTER — Encounter: Payer: Self-pay | Admitting: Endocrinology

## 2014-02-10 ENCOUNTER — Ambulatory Visit (INDEPENDENT_AMBULATORY_CARE_PROVIDER_SITE_OTHER): Payer: Commercial Managed Care - HMO | Admitting: Endocrinology

## 2014-02-10 VITALS — BP 152/82 | HR 95 | Temp 98.0°F | Ht 71.0 in | Wt 255.0 lb

## 2014-02-10 DIAGNOSIS — N259 Disorder resulting from impaired renal tubular function, unspecified: Secondary | ICD-10-CM | POA: Diagnosis not present

## 2014-02-10 DIAGNOSIS — E119 Type 2 diabetes mellitus without complications: Secondary | ICD-10-CM | POA: Diagnosis not present

## 2014-02-10 DIAGNOSIS — I10 Essential (primary) hypertension: Secondary | ICD-10-CM

## 2014-02-10 DIAGNOSIS — R972 Elevated prostate specific antigen [PSA]: Secondary | ICD-10-CM

## 2014-02-10 DIAGNOSIS — Z0189 Encounter for other specified special examinations: Secondary | ICD-10-CM

## 2014-02-10 DIAGNOSIS — E7800 Pure hypercholesterolemia, unspecified: Secondary | ICD-10-CM

## 2014-02-10 DIAGNOSIS — K625 Hemorrhage of anus and rectum: Secondary | ICD-10-CM

## 2014-02-10 DIAGNOSIS — Z0000 Encounter for general adult medical examination without abnormal findings: Secondary | ICD-10-CM | POA: Insufficient documentation

## 2014-02-10 LAB — URINALYSIS, ROUTINE W REFLEX MICROSCOPIC
Bilirubin Urine: NEGATIVE
HGB URINE DIPSTICK: NEGATIVE
Ketones, ur: NEGATIVE
LEUKOCYTES UA: NEGATIVE
Nitrite: NEGATIVE
PH: 5.5 (ref 5.0–8.0)
Specific Gravity, Urine: 1.025 (ref 1.000–1.030)
Total Protein, Urine: 30 — AB
UROBILINOGEN UA: 0.2 (ref 0.0–1.0)
Urine Glucose: NEGATIVE

## 2014-02-10 LAB — BASIC METABOLIC PANEL
BUN: 16 mg/dL (ref 6–23)
CO2: 29 mEq/L (ref 19–32)
Calcium: 9 mg/dL (ref 8.4–10.5)
Chloride: 104 mEq/L (ref 96–112)
Creatinine, Ser: 1.24 mg/dL (ref 0.40–1.50)
GFR: 75.02 mL/min (ref >60.00)
GLUCOSE: 84 mg/dL (ref 70–99)
Potassium: 4.1 mEq/L (ref 3.5–5.1)
Sodium: 139 mEq/L (ref 135–145)

## 2014-02-10 LAB — CBC WITH DIFFERENTIAL/PLATELET
BASOS ABS: 0 10*3/uL (ref 0.0–0.1)
BASOS PCT: 0.5 % (ref 0.0–3.0)
EOS ABS: 0.4 10*3/uL (ref 0.0–0.7)
EOS PCT: 4.1 % (ref 0.0–5.0)
HCT: 38.8 % — ABNORMAL LOW (ref 39.0–52.0)
Hemoglobin: 12.9 g/dL — ABNORMAL LOW (ref 13.0–17.0)
LYMPHS ABS: 1.7 10*3/uL (ref 0.7–4.0)
Lymphocytes Relative: 18.4 % (ref 12.0–46.0)
MCHC: 33.2 g/dL (ref 30.0–36.0)
MCV: 79.7 fl (ref 78.0–100.0)
Monocytes Absolute: 1.1 10*3/uL — ABNORMAL HIGH (ref 0.1–1.0)
Monocytes Relative: 12.2 % — ABNORMAL HIGH (ref 3.0–12.0)
NEUTROS PCT: 64.8 % (ref 43.0–77.0)
Neutro Abs: 6 10*3/uL (ref 1.4–7.7)
Platelets: 193 10*3/uL (ref 150.0–400.0)
RBC: 4.86 Mil/uL (ref 4.22–5.81)
RDW: 16.2 % — ABNORMAL HIGH (ref 11.5–15.5)
WBC: 9.3 10*3/uL (ref 4.0–10.5)

## 2014-02-10 LAB — LIPID PANEL
Cholesterol: 126 mg/dL (ref 0–200)
HDL: 36.3 mg/dL — AB (ref >39.00)
LDL CALC: 74 mg/dL (ref 0–99)
NonHDL: 89.7
Total CHOL/HDL Ratio: 3
Triglycerides: 79 mg/dL (ref 0.0–149.0)
VLDL: 15.8 mg/dL (ref 0.0–40.0)

## 2014-02-10 LAB — HEPATIC FUNCTION PANEL
ALBUMIN: 4 g/dL (ref 3.5–5.2)
ALT: 22 U/L (ref 0–53)
AST: 23 U/L (ref 0–37)
Alkaline Phosphatase: 111 U/L (ref 39–117)
Bilirubin, Direct: 0.1 mg/dL (ref 0.0–0.3)
Total Bilirubin: 0.7 mg/dL (ref 0.2–1.2)
Total Protein: 6.8 g/dL (ref 6.0–8.3)

## 2014-02-10 LAB — HEMOGLOBIN A1C: HEMOGLOBIN A1C: 6.4 % (ref 4.6–6.5)

## 2014-02-10 LAB — MICROALBUMIN / CREATININE URINE RATIO
Creatinine,U: 193.8 mg/dL
MICROALB/CREAT RATIO: 12.6 mg/g (ref 0.0–30.0)
Microalb, Ur: 24.4 mg/dL — ABNORMAL HIGH (ref 0.0–1.9)

## 2014-02-10 LAB — TSH: TSH: 1.58 u[IU]/mL (ref 0.35–4.50)

## 2014-02-10 MED ORDER — HYDROCORTISONE 1 % EX LOTN: 1.0000 "application " | TOPICAL_LOTION | Freq: Three times a day (TID) | CUTANEOUS | Status: DC

## 2014-02-10 NOTE — Progress Notes (Signed)
Subjective:    Patient ID: Shane [name], male    DOB: 1950-03-19, 64 y.o.   MRN: [id number]  HPI Pt states of moderate hemorrhoids at the rectum, and assoc slight bleeding.   He wants to see a different urologist.   Past Medical History  Diagnosis Date  . DIABETES MELLITUS, TYPE II 09/25/2006    Qualifier: Diagnosis of  By: Loanne Drilling MD, Jacelyn Pi   . HYPERCHOLESTEROLEMIA 01/22/2007    Qualifier: Diagnosis of  By: Harvel Ricks    . HYPERTENSION 08/24/2006    Qualifier: Diagnosis of  By: Loanne Drilling MD, Karthikeya Funke A   . PSA, INCREASED 11/30/2009    Qualifier: Diagnosis of  By: Loanne Drilling MD, Jacelyn Pi RENAL INSUFFICIENCY 09/25/2006    Qualifier: Diagnosis of  By: Loanne Drilling MD, Yalitza Teed A   . INSOMNIA 09/10/2008    Qualifier: Diagnosis of  By: Loanne Drilling MD, Jacelyn Pi Colon polyps 06/17/2011  . Diverticulosis 06/17/2011  . CIGARETTE SMOKER 11/30/2009    Qualifier: Diagnosis of  By: Loanne Drilling MD, Jacelyn Pi     Past Surgical History  Procedure Laterality Date  . Hernia repair    . Tonsillectomy    . Colonoscopy    . Polypectomy      History   Social History  . Marital Status: Married    Spouse Name: N/A    Number of Children: N/A  . Years of Education: N/A   Occupational History  . [employer]    Social History Main Topics  . Smoking status: Current Every Day Smoker -- 1.00 packs/day for 40 years    Types: Cigarettes  . Smokeless tobacco: Never Used  . Alcohol Use: 7.2 oz/week    12 Cans of beer per week  . Drug Use: No  . Sexual Activity: Not on file   Other Topics Concern  . 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 atorvastatin (LIPITOR) 80 MG tablet Take 1 tablet (80 mg total) by mouth daily. 90 tablet 3  . hydrochlorothiazide (MICROZIDE) 12.5 MG capsule TAKE ONE CAPSULE BY MOUTH EVERY DAY 90 capsule 3  . metFORMIN (GLUCOPHAGE-XR) 500 MG 24 hr  tablet TAKE ONE TABLET BY MOUTH ONCE DAILY 90 tablet 0  . NON FORMULARY Garlic-Take one daily    . omeprazole (PRILOSEC) 20 MG capsule Take 1 capsule (20 mg total) by mouth daily. 90 capsule 3  . sildenafil (REVATIO) 20 MG tablet Take 1 tablet (20 mg total) by mouth 3 (three) times daily. 50 tablet 11  . traZODone (DESYREL) 150 MG tablet TAKE ONE TABLET BY MOUTH AT BEDTIME 90 tablet 2   No current facility-administered medications on file prior to visit.    No Known Allergies  Family History  Problem Relation Age of Onset  . Cancer Father   . Hypertension Father   . Heart disease Mother   . Heart disease Sister     BP 152/82 mmHg  Pulse 95  Temp(Src) 98 F (36.7 C) (Oral)  Ht 5\' 11"  (1.803 m)  Wt 255 lb (115.667 kg)  BMI 35.58 kg/m2  SpO2 97%  Review of Systems He has moderate itching of the scalp.  He has gained a few lbs.      Objective:   Physical Exam VITAL SIGNS:  See vs page GENERAL: no distress Scalp: mild eczematous  changes. Rectum: no mass, hemorrhoids, or blood seen externally Pulses: dorsalis pedis intact bilat.   MSK: no deformity of the feet CV: no leg edema Skin:  no ulcer on the feet.  normal color and temp on the feet. Neuro: sensation is intact to touch on the feet    Lab Results  Component Value Date   WBC 9.3 02/10/2014   HGB 12.9* 02/10/2014   HCT 38.8* 02/10/2014   PLT 193.0 02/10/2014   GLUCOSE 84 02/10/2014   CHOL 126 02/10/2014   TRIG 79.0 02/10/2014   HDL 36.30* 02/10/2014   LDLDIRECT 184.4 02/10/2013   LDLCALC 74 02/10/2014   ALT 22 02/10/2014   AST 23 02/10/2014   NA 139 02/10/2014   K 4.1 02/10/2014   CL 104 02/10/2014   CREATININE 1.24 02/10/2014   BUN 16 02/10/2014   CO2 29 02/10/2014   TSH 1.58 02/10/2014   PSA 5.53* 02/10/2013   HGBA1C 6.4 02/10/2014   MICROALBUR 24.4* 02/10/2014      Assessment & Plan:  BRBPR, new, uncertain etiology Dyshidrosis of the scalp. Increased PSA.  He needs f/u Anemia, new, ? Related  to BRBPR.  We'll recheck   Patient is advised the following: Patient Instructions  blood tests are being requested for you today.  We'll let you know about the results.  Please come back soon for a regular physical appointment.  Please see 2 specialists.  you will receive a phone call, about days and times for appointments. i have sent a prescription to your pharmacy, for a lotion for the scalp itching. Also, using a shampoo like selsun blue can help.

## 2014-02-10 NOTE — Patient Instructions (Addendum)
blood tests are being requested for you today.  We'll let you know about the results.  Please come back soon for a regular physical appointment.  Please see 2 specialists.  you will receive a phone call, about days and times for appointments. i have sent a prescription to your pharmacy, for a lotion for the scalp itching. Also, using a shampoo like selsun blue can help.

## 2014-02-11 ENCOUNTER — Encounter: Payer: Self-pay | Admitting: Gastroenterology

## 2014-02-12 ENCOUNTER — Encounter: Payer: Self-pay | Admitting: Gastroenterology

## 2014-02-25 ENCOUNTER — Encounter: Payer: Self-pay | Admitting: Endocrinology

## 2014-02-25 ENCOUNTER — Ambulatory Visit (INDEPENDENT_AMBULATORY_CARE_PROVIDER_SITE_OTHER): Payer: Medicare HMO | Admitting: Endocrinology

## 2014-02-25 VITALS — BP 122/78 | HR 82 | Temp 98.3°F | Ht 71.0 in | Wt 252.0 lb

## 2014-02-25 DIAGNOSIS — R079 Chest pain, unspecified: Secondary | ICD-10-CM | POA: Diagnosis not present

## 2014-02-25 DIAGNOSIS — Z0000 Encounter for general adult medical examination without abnormal findings: Secondary | ICD-10-CM | POA: Diagnosis not present

## 2014-02-25 MED ORDER — DOXYCYCLINE HYCLATE 100 MG PO TABS: 100.0000 mg | ORAL_TABLET | Freq: Two times a day (BID) | ORAL | Status: DC

## 2014-02-25 NOTE — Progress Notes (Signed)
we discussed code status.  pt requests full code, but would not want to be started or maintained on artificial life-support measures if there was not a reasonable chance of recovery 

## 2014-02-25 NOTE — Patient Instructions (Addendum)
Let's check a "treadmill" (heart) test.   i have sent a prescription to your pharmacy, for an antibiotic pill.   please consider these measures for your health:  minimize alcohol.  do not use tobacco products.  have a colonoscopy at least every 10 years from age 64.  keep firearms safely stored.  always use seat belts.  have working smoke alarms in your home.  see an eye doctor and dentist regularly.  never drive under the influence of alcohol or drugs (including prescription drugs).   it is critically important to prevent falling down (keep floor areas well-lit, dry, and free of loose objects.  If you have a cane, walker, or wheelchair, you should use it, even for short trips around the house.  Also, try not to rush).   Please come back for a follow-up appointment in 6 months.   Please start taking aspirin, 1 per day

## 2014-02-25 NOTE — Progress Notes (Signed)
Subjective:    Patient ID: Shane [name], male    DOB: [birth year], 64 y.o.   MRN: [id number]  HPI Pt is here for regular wellness examination, and is feeling pretty well in general, and says chronic med probs are stable, except as noted below Past Medical History  Diagnosis Date  . DIABETES MELLITUS, TYPE II 09/25/2006    Qualifier: Diagnosis of  By: Loanne Drilling MD, Jacelyn Pi   . HYPERCHOLESTEROLEMIA 01/22/2007    Qualifier: Diagnosis of  By: Harvel Ricks    . HYPERTENSION 08/24/2006    Qualifier: Diagnosis of  By: Loanne Drilling MD, Starla Deller A   . PSA, INCREASED 11/30/2009    Qualifier: Diagnosis of  By: Loanne Drilling MD, Jacelyn Pi RENAL INSUFFICIENCY 09/25/2006    Qualifier: Diagnosis of  By: Loanne Drilling MD, Katieann Hungate A   . INSOMNIA 09/10/2008    Qualifier: Diagnosis of  By: Loanne Drilling MD, Jacelyn Pi Colon polyps 06/17/2011  . Diverticulosis 06/17/2011  . CIGARETTE SMOKER 11/30/2009    Qualifier: Diagnosis of  By: Loanne Drilling MD, Jacelyn Pi     Past Surgical History  Procedure Laterality Date  . Hernia repair    . Tonsillectomy    . Colonoscopy    . Polypectomy      History   Social History  . Marital Status: Married    Spouse Name: N/A  . Number of Children: N/A  . Years of Education: N/A   Occupational History  . [employer]    Social History Main Topics  . Smoking status: Current Every Day Smoker -- 1.00 packs/day for 40 years    Types: Cigarettes  . Smokeless tobacco: Never Used  . Alcohol Use: 7.2 oz/week    12 Cans of beer per week  . Drug Use: No  . Sexual Activity: Not on file   Other Topics Concern  . 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 atorvastatin (LIPITOR) 80 MG tablet Take 1 tablet (80 mg total) by mouth daily. 90 tablet 3  . hydrochlorothiazide (MICROZIDE) 12.5 MG capsule TAKE ONE CAPSULE BY MOUTH EVERY DAY 90 capsule 3  . hydrocortisone  1 % lotion Apply 1 application topically 3 (three) times daily. As needed for itching 118 mL 5  . metFORMIN (GLUCOPHAGE-XR) 500 MG 24 hr tablet TAKE ONE TABLET BY MOUTH ONCE DAILY 90 tablet 0  . NON FORMULARY Garlic-Take one daily    . omeprazole (PRILOSEC) 20 MG capsule Take 1 capsule (20 mg total) by mouth daily. 90 capsule 3  . sildenafil (REVATIO) 20 MG tablet Take 1 tablet (20 mg total) by mouth 3 (three) times daily. 50 tablet 11  . traZODone (DESYREL) 150 MG tablet TAKE ONE TABLET BY MOUTH AT BEDTIME 90 tablet 2   No current facility-administered medications on file prior to visit.    No Known Allergies  Family History  Problem Relation Age of Onset  . Cancer Father   . Hypertension Father   . Heart disease Mother   . Heart disease Sister     BP 122/78 mmHg  Pulse 82  Temp(Src) 98.3 F (36.8 C) (Oral)  Ht 5\' 11"  (1.803 m)  Wt 252 lb (114.306 kg)  BMI 35.16 kg/m2  Review of Systems  Constitutional: Negative for unexpected weight change.  HENT: Negative for hearing loss.   Eyes: Negative  for visual disturbance.  Respiratory: Negative for cough.   Cardiovascular: Negative for leg swelling.  Gastrointestinal: Negative for abdominal pain.  Endocrine: Negative for cold intolerance.  Genitourinary: Negative for hematuria and difficulty urinating.  Musculoskeletal: Negative for back pain.  Skin: Negative for wound.  Allergic/Immunologic: Negative for environmental allergies.  Neurological: Negative for numbness.  Hematological: Does not bruise/bleed easily.  Psychiatric/Behavioral: Negative for dysphoric mood.       Objective:   Physical Exam VS: see vs page GEN: no distress HEAD: head: no deformity eyes: no periorbital swelling, no proptosis external nose and ears are normal mouth: no lesion seen NECK: supple, thyroid is not enlarged CHEST WALL: no deformity LUNGS: clear to auscultation BREASTS:  No gynecomastia ABD: abdomen is soft, nontender.  no  hepatosplenomegaly.  not distended.  Self-reducing ventral hernia  RECTAL: normal external and internal exam.  heme neg. PROSTATE:  Normal size.  No nodule MUSCULOSKELETAL: muscle bulk and strength are grossly normal.  no obvious joint swelling.  gait is normal and steady EXTEMITIES: no deformity.  no ulcer on the feet.  feet are of normal color and temp.  no edema PULSES: dorsalis pedis intact bilat.  no carotid bruit NEURO:  cn 2-12 grossly intact.   readily moves all 4's.  sensation is intact to touch on the feet SKIN:  Normal texture and temperature.  No rash or suspicious lesion is visible.   NODES:  None palpable at the neck PSYCH: alert, well-oriented.  Does not appear anxious nor depressed.      Assessment & Plan:  Wellness visit today, with problems stable, except as noted. please consider these measures for your health:  minimize alcohol.  do not use tobacco products.  have a colonoscopy at least every 10 years from age 65.  keep firearms safely stored.  always use seat belts.  have working smoke alarms in your home.  see an eye doctor and dentist regularly.  never drive under the influence of alcohol or drugs (including prescription drugs).   it is critically important to prevent falling down (keep floor areas well-lit, dry, and free of loose objects.  If you have a cane, walker, or wheelchair, you should use it, even for short trips around the house.  Also, try not to rush).   Please come back for a follow-up appointment in 6 months.      SEPARATE EVALUATION FOLLOWS--EACH PROBLEM HERE IS NEW, NOT RESPONDING TO TREATMENT, OR POSES SIGNIFICANT RISK TO THE PATIENT'S HEALTH: HISTORY OF THE PRESENT ILLNESS: Pt states 1 year of slight discomfort in the chest, in the context of exertion.  It resolves with rest.  No assoc sob.   PAST MEDICAL HISTORY reviewed and up to date today REVIEW OF SYSTEMS: Scalp rash persists, but he denies fever. PHYSICAL EXAMINATION: VITAL SIGNS:  See vs  page GENERAL: no distress Scalp: mild folliculitis.  HEART:  Regular rate and rhythm without murmurs noted. Normal S1,S2.   LAB/XRAY RESULTS: i personally reviewed electrocardiogram tracing: from 02/10/14 IMPRESSION: Chest pain.  New to me, but sounds like stable angina.   Folliculitis, of the scalp. PLAN: Let's check a "treadmill" (heart) test.   i have sent a prescription to your pharmacy, for an antibiotic pill.     Please start taking aspirin, 1 per day.

## 2014-02-27 ENCOUNTER — Ambulatory Visit (INDEPENDENT_AMBULATORY_CARE_PROVIDER_SITE_OTHER): Payer: Commercial Managed Care - HMO | Admitting: Physician Assistant

## 2014-02-27 DIAGNOSIS — R079 Chest pain, unspecified: Secondary | ICD-10-CM | POA: Diagnosis not present

## 2014-02-27 DIAGNOSIS — R9439 Abnormal result of other cardiovascular function study: Secondary | ICD-10-CM | POA: Diagnosis not present

## 2014-02-27 NOTE — Progress Notes (Signed)
Exercise Treadmill Test  Shane [name] is a 64 y.o. male smoker with a hx of DM2, HTN, HL referred by PCP for ETT 2/2 exertional chest pain and dyspnea. No syncope. Exam unremarkable.  ECG:  NSR, no ST changes.  Pre-Exercise Testing Evaluation Rhythm: sinus tachycardia  Rate: 93 bpm     Test  Exercise Tolerance Test Ordering MD: Darlin Coco, MD  Interpreting MD: Richardson Dopp, PA-C  Unique Test No: 1  Treadmill:  1  Indication for ETT: chest pain - rule out ischemia  Contraindication to ETT: No   Stress Modality: exercise - treadmill  Cardiac Imaging Performed: non   Protocol: standard Bruce - maximal  Max BP:  224/87  Max MPHR (bpm):  154 85% MPR (bpm):  131  MPHR obtained (bpm):  139 % MPHR obtained:  90  Reached 85% MPHR (min:sec):  1:33 Total Exercise Time (min-sec):  3:00  Workload in METS:  4.6 Borg Scale: 13  Reason ETT Terminated:  patient's desire to stop    ST Segment Analysis At Rest: normal ST segments - no evidence of significant ST depression With Exercise: borderline ST changes  Other Information Arrhythmia:  No Angina during ETT:  present (1) Quality of ETT:  indeterminate  ETT Interpretation:  borderline (indeterminate) with non-specific ST changes  Comments: Poor exercise capacity. Patient did complain of chest pain/"discomfort" during exercise. Hypertensive BP response to exercise. There was borderline ST depression in the lateral leads at peak exercise.  Cannot rule out ischemia.   Recommendations: Given significant risk factors, + chest pain, borderline ST changes, will arrange Lexiscan Myoview. Signed, Richardson Dopp, PA-C   02/27/2014 9:32 AM

## 2014-03-09 ENCOUNTER — Encounter (HOSPITAL_COMMUNITY): Payer: Medicare HMO

## 2014-03-10 ENCOUNTER — Telehealth: Payer: Self-pay | Admitting: Endocrinology

## 2014-03-10 ENCOUNTER — Ambulatory Visit (HOSPITAL_COMMUNITY): Payer: Commercial Managed Care - HMO | Attending: Endocrinology | Admitting: Radiology

## 2014-03-10 DIAGNOSIS — R079 Chest pain, unspecified: Secondary | ICD-10-CM | POA: Insufficient documentation

## 2014-03-10 DIAGNOSIS — R9439 Abnormal result of other cardiovascular function study: Secondary | ICD-10-CM | POA: Diagnosis not present

## 2014-03-10 MED ORDER — REGADENOSON 0.4 MG/5ML IV SOLN
0.4000 mg | Freq: Once | INTRAVENOUS | Status: AC
  Administered 2014-03-10: 0.4 mg via INTRAVENOUS

## 2014-03-10 MED ORDER — TECHNETIUM TC 99M SESTAMIBI GENERIC - CARDIOLITE
30.0000 | Freq: Once | INTRAVENOUS | Status: AC | PRN
  Administered 2014-03-10: 30 via INTRAVENOUS

## 2014-03-10 MED ORDER — GLUCOSE BLOOD VI STRP
1.0000 | ORAL_STRIP | Freq: Every day | Status: DC
Start: 2014-03-10 — End: 2014-03-19

## 2014-03-10 MED ORDER — TECHNETIUM TC 99M SESTAMIBI GENERIC - CARDIOLITE
10.0000 | Freq: Once | INTRAVENOUS | Status: AC | PRN
  Administered 2014-03-10: 10 via INTRAVENOUS

## 2014-03-10 MED ORDER — ONETOUCH VERIO W/DEVICE KIT: 1.0000 | PACK | Freq: Once | Status: DC

## 2014-03-10 NOTE — Telephone Encounter (Signed)
Patient stated that he has never been given test strips and or meter to check his blood sugar. Stated Sabrina I for get where she called from but she ask you to cal her Phone # [phone]

## 2014-03-10 NOTE — Progress Notes (Signed)
[facility] [place], [place] [address] [phone]    Cardiology Nuclear Med Study  Shane [name] is a 64 y.o. male     MRN : [id number]     DOB: 12-06-1950  Procedure Date: 03/10/2014  Nuclear Med Background Indication for Stress Test:  Evaluation for Ischemia History:  Abnormal GXT Cardiac Risk Factors: NIDDM  Symptoms:  Chest Pain with Exertion (last date of chest discomfort 1 month ago.)   Nuclear Pre-Procedure Caffeine/Decaff Intake:  None NPO After: 8:30pm   Lungs:  clear O2 Sat: 95% on room air. IV 0.9% NS with Angio Cath:  20g  IV Site: R Antecubital  IV Started by:  Earl Many, CNMT  Chest Size (in):  XXL Cup Size: n/a  Height:    Weight:  253 lb (114.76 kg)  BMI:  Body mass index is 35.3 kg/(m^2). Tech Comments:  NA    Nuclear Med Study 1 or 2 day study: 1 day  Stress Test Type:  Treadmill/Lexiscan  Reading MD: Alma Friendly, MD  Order Authorizing Provider:  Kathleen Argue, PA  Resting Radionuclide: Technetium 34m Sestamibi  Resting Radionuclide Dose: 11.0 mCi   Stress Radionuclide:  Technetium 5m Sestamibi  Stress Radionuclide Dose: 33.0 mCi           Stress Protocol Rest HR: 76 Stress HR: 125  Rest BP: 133/81 Stress BP: 98/60  Exercise Time (min): n/a METS: n/a           Dose of Adenosine (mg):  n/a Dose of Lexiscan: 0.4 mg  Dose of Atropine (mg): n/a Dose of Dobutamine: n/a mcg/kg/min (at max HR)  Stress Test Technologist: Glade Lloyd, BS-ES  Nuclear Technologist:  Earl Many, CNMT     Rest Procedure:  Myocardial perfusion imaging was performed at rest 45 minutes following the intravenous administration of Technetium 88m Sestamibi. Rest ECG: NSR with lead artifact  Stress Procedure:  The patient received IV Lexiscan 0.4 mg over 15-seconds with concurrent low level exercise and then Technetium 43m Sestamibi was injected at 30-seconds while the patient continued walking one more minute.  Quantitative spect  images were obtained after a 45-minute delay.  During the infusion of Lexiscan the patient complained of SOB, head tingling and lightheadedness.  This resolved in recovery.  Stress ECG: No significant change from baseline ECG  QPS Raw Data Images:  Normal; no motion artifact; normal heart/lung ratio. Stress Images:  There is decreased uptake in the inferior wall. Rest Images:  There is decreased uptake in the inferior wall. Subtraction (SDS):  SDS 6 Transient Ischemic Dilatation (Normal <1.22):  1.01 Lung/Heart Ratio (Normal <0.45):  0.31  Quantitative Gated Spect Images QGS EDV:  123 ml QGS ESV:  65 ml  Impression Exercise Capacity:  Lexiscan with low level exercise. BP Response:  Hypotensive blood pressure response. Clinical Symptoms:  dyspnea, head tingling, lightheaded ECG Impression:  No significant ECG changes with Lexiscan. Comparison with Prior Nuclear Study: No previous nuclear study performed  Overall Impression:  Intermediate risk stress nuclear study with a moderate-sized and intensity, reversible inferior and inferoapical perfusion defect suggestive of ischemia.  LV Ejection Fraction: 48%.  LV Wall Motion:  Inferior hypokinesis   [name], MD, [place] Certified in Nuclear Cardiology Attending Cardiologist [facility]

## 2014-03-10 NOTE — Telephone Encounter (Signed)
cbg monitoring in pt's situation is not supported by evidence, but it is fine with me to start checking.  i have sent a prescription to your pharmacy

## 2014-03-10 NOTE — Telephone Encounter (Signed)
Pt went for a nuclear medicine stress test today. Pt advised nurse at department he does not have a glucometer and he has not been advised that he needs to check his blood sugar. Nurse wanted MD to be advised. Could you please review and advise if this is correct? Thanks!

## 2014-03-11 ENCOUNTER — Encounter: Payer: Self-pay | Admitting: Physician Assistant

## 2014-03-11 NOTE — Telephone Encounter (Signed)
Pt advised of note below and voiced understanding.  

## 2014-03-12 ENCOUNTER — Telehealth: Payer: Self-pay | Admitting: Physician Assistant

## 2014-03-12 NOTE — Telephone Encounter (Signed)
New message  ° ° °Patient calling back to speak with nurse  °

## 2014-03-12 NOTE — Telephone Encounter (Signed)
Notified of stress test results.  Appointment made with Margaret Pyle at 10:10 on 3/4.

## 2014-03-13 ENCOUNTER — Encounter: Payer: Self-pay | Admitting: Cardiology

## 2014-03-13 ENCOUNTER — Ambulatory Visit (INDEPENDENT_AMBULATORY_CARE_PROVIDER_SITE_OTHER): Payer: Commercial Managed Care - HMO | Admitting: Cardiology

## 2014-03-13 ENCOUNTER — Ambulatory Visit: Payer: Commercial Managed Care - HMO | Admitting: Physician Assistant

## 2014-03-13 ENCOUNTER — Other Ambulatory Visit: Payer: Self-pay | Admitting: Endocrinology

## 2014-03-13 VITALS — BP 162/94 | HR 76 | Ht 71.0 in | Wt 255.0 lb

## 2014-03-13 DIAGNOSIS — R9439 Abnormal result of other cardiovascular function study: Secondary | ICD-10-CM | POA: Diagnosis not present

## 2014-03-13 DIAGNOSIS — R600 Localized edema: Secondary | ICD-10-CM | POA: Diagnosis not present

## 2014-03-13 DIAGNOSIS — Z01812 Encounter for preprocedural laboratory examination: Secondary | ICD-10-CM

## 2014-03-13 DIAGNOSIS — R079 Chest pain, unspecified: Secondary | ICD-10-CM

## 2014-03-13 LAB — BASIC METABOLIC PANEL
BUN: 15 mg/dL (ref 6–23)
CALCIUM: 9.5 mg/dL (ref 8.4–10.5)
CO2: 30 meq/L (ref 19–32)
CREATININE: 1.13 mg/dL (ref 0.40–1.50)
Chloride: 104 mEq/L (ref 96–112)
GFR: 83.49 mL/min (ref >60.00)
GLUCOSE: 81 mg/dL (ref 70–99)
Potassium: 4.2 mEq/L (ref 3.5–5.1)
Sodium: 138 mEq/L (ref 135–145)

## 2014-03-13 LAB — CBC WITH DIFFERENTIAL/PLATELET
Basophils Absolute: 0 10*3/uL (ref 0.0–0.1)
Basophils Relative: 0.4 % (ref 0.0–3.0)
Eosinophils Absolute: 0.3 10*3/uL (ref 0.0–0.7)
Eosinophils Relative: 2.9 % (ref 0.0–5.0)
HCT: 38.2 % — ABNORMAL LOW (ref 39.0–52.0)
HEMOGLOBIN: 12.9 g/dL — AB (ref 13.0–17.0)
LYMPHS PCT: 14 % (ref 12.0–46.0)
Lymphs Abs: 1.3 10*3/uL (ref 0.7–4.0)
MCHC: 33.7 g/dL (ref 30.0–36.0)
MCV: 79.8 fl (ref 78.0–100.0)
MONOS PCT: 10.7 % (ref 3.0–12.0)
Monocytes Absolute: 1 10*3/uL (ref 0.1–1.0)
NEUTROS ABS: 6.6 10*3/uL (ref 1.4–7.7)
Neutrophils Relative %: 72 % (ref 43.0–77.0)
Platelets: 190 10*3/uL (ref 150.0–400.0)
RBC: 4.79 Mil/uL (ref 4.22–5.81)
RDW: 16.4 % — ABNORMAL HIGH (ref 11.5–15.5)
WBC: 9.2 10*3/uL (ref 4.0–10.5)

## 2014-03-13 NOTE — Progress Notes (Signed)
Cardiology Office Note   Date:  03/13/2014   ID:  Shane [name], DOB 12-20-1950, MRN [id number]  PCP:  Renato Shin, MD  Cardiologist:  New (Dr. Burt Knack) Referring MD: Dr. Renato Shin   Chief Complaint  Patient presents with  . Abnormal ECG/ Abnormal Stress Test      History of Present Illness: Shane [name] is a 64 y.o. male, new to our office, who presents to clinic for evaluation after undergoing a nuclear stress test 02/25/14 that was abnormal. The test was ordered by his PCP Dr. Loanne Drilling. He initially underwent an exercise tolerance test which demonstrated borderline ST changes. It was recommended that he undergo further evaluation with a Lexiscan Myoview. This demonstrated a moderate-sized and intensity, reversible inferior and inferoapical perfusion defect suggestive of ischemia. EF was estimated at 48%. There was also inferior hypokinesis.  The patient's PMH is significant for 40+ year tobacco abuse, T2DM, HTN, HLD, chronic renal insufficiency w/ baseline SCr ~1.2 as well as strong family history of heart disease.   Today in clinic, he notes a 3 year history of intermittent left sided chest discomfort and dyspnea on exertion. Symptoms have worsened recently. He denies associated orthopnea, PND, LEE, dizziness, syncope/near syncope.  He also complains of right sided leg pain during ambulation. Symptoms are concerning for claudication. He continues to smoke daily. His DM is pretty well controlled. Recent Hgb A1c was 6.5. His cholesterol is well controlled with Lipitor. Recent LDL was 74 mg/dL.  He reports that he is due for a repeat colonoscopy at the end of this month. He has a h/o abnormal colonoscopy in the past with removal of precancerous polyps. He notes recent h/o of melena. Review of recent labs reveal drop in Hgb from 15.4 to 12.9 (02/10/14).      Past Medical History  Diagnosis Date  . DIABETES MELLITUS, TYPE II 09/25/2006    Qualifier: Diagnosis of  By: Loanne Drilling MD, Jacelyn Pi     . HYPERCHOLESTEROLEMIA 01/22/2007    Qualifier: Diagnosis of  By: Harvel Ricks    . HYPERTENSION 08/24/2006    Qualifier: Diagnosis of  By: Loanne Drilling MD, Sean A   . PSA, INCREASED 11/30/2009    Qualifier: Diagnosis of  By: Loanne Drilling MD, Jacelyn Pi RENAL INSUFFICIENCY 09/25/2006    Qualifier: Diagnosis of  By: Loanne Drilling MD, Sean A   . INSOMNIA 09/10/2008    Qualifier: Diagnosis of  By: Loanne Drilling MD, Jacelyn Pi Colon polyps 06/17/2011  . Diverticulosis 06/17/2011  . CIGARETTE SMOKER 11/30/2009    Qualifier: Diagnosis of  By: Loanne Drilling MD, Jacelyn Pi   . Hx of cardiovascular stress test     Lexiscan Myoview 3/16:  Inferior ischemia, EF 48%, Intermediate Risk    Past Surgical History  Procedure Laterality Date  . Hernia repair    . Tonsillectomy    . Colonoscopy    . Polypectomy       Current Outpatient Prescriptions  Medication Sig Dispense Refill  . aspirin 81 MG tablet Take 81 mg by mouth daily.    [name] Kitchen atorvastatin (LIPITOR) 80 MG tablet Take 1 tablet (80 mg total) by mouth daily. 90 tablet 3  . Blood Glucose Monitoring Suppl (ONETOUCH VERIO) W/DEVICE KIT 1 Device by Does not apply route once. 1 kit 0  . glucose blood (ONETOUCH VERIO) test strip 1 each by Other route daily. And lancets 1/day 100 each 12  . hydrochlorothiazide (MICROZIDE) 12.5 MG capsule TAKE ONE CAPSULE  BY MOUTH EVERY DAY 90 capsule 3  . hydrocortisone 1 % lotion Apply 1 application topically 3 (three) times daily. As needed for itching 118 mL 5  . metFORMIN (GLUCOPHAGE-XR) 500 MG 24 hr tablet TAKE ONE TABLET BY MOUTH ONCE DAILY 90 tablet 0  . NON FORMULARY Garlic-Take one daily    . omeprazole (PRILOSEC) 20 MG capsule Take 1 capsule (20 mg total) by mouth daily. 90 capsule 3  . sildenafil (REVATIO) 20 MG tablet Take 1 tablet (20 mg total) by mouth 3 (three) times daily. (Patient taking differently: Take 20 mg by mouth daily. ) 50 tablet 11  . traZODone (DESYREL) 150 MG tablet TAKE ONE TABLET BY MOUTH AT BEDTIME 90 tablet 2   . doxycycline (VIBRA-TABS) 100 MG tablet Take 1 tablet (100 mg total) by mouth 2 (two) times daily. (Patient not taking: Reported on 03/13/2014) 20 tablet 0   No current facility-administered medications for this visit.    Allergies:   Review of patient's allergies indicates no known allergies.    Social History:  The patient  reports that he has been smoking Cigarettes.  He has a 40 pack-year smoking history. He has never used smokeless tobacco. He reports that he drinks about 7.2 oz of alcohol per week. He reports that he does not use illicit drugs.   Family History:  The patient's family history includes Cancer in his father; Heart disease in his mother and sister; Hypertension in his father.    ROS:  Please see the history of present illness.   Otherwise, review of systems are positive for chest pain, DOE and claudication.   All other systems are reviewed and negative.    PHYSICAL EXAM: VS:  BP 162/94 mmHg  Pulse 76  Ht _0  (1.803 m)  Wt 255 lb (115.667 kg)  BMI 35.58 kg/m2 , BMI Body mass index is 35.58 kg/(m^2). GEN: Well nourished, well developed, in no acute distress HEENT: normal Neck: no JVD, carotid bruits, or masses Cardiac: RRR; no murmurs, rubs, or gallops,no edema  Respiratory:  clear to auscultation bilaterally, normal work of breathing GI: soft, nontender, nondistended, + BS MS: no deformity or atrophy Skin: warm and dry, no rash Neuro:  Strength and sensation are intact Psych: euthymic mood, full affect   EKG:  EKG is not ordered today.    Recent Labs: 02/10/2014: ALT 22; BUN 16; Creatinine 1.24; Hemoglobin 12.9*; Platelets 193.0; Potassium 4.1; Sodium 139; TSH 1.58    Lipid Panel    Component Value Date/Time   CHOL 126 02/10/2014 0917   TRIG 79.0 02/10/2014 0917   HDL 36.30* 02/10/2014 0917   CHOLHDL 3 02/10/2014 0917   VLDL 15.8 02/10/2014 0917   LDLCALC 74 02/10/2014 0917   LDLDIRECT 184.4 02/10/2013 0959      Wt Readings from Last 3  Encounters:  03/13/14 255 lb (115.667 kg)  03/10/14 253 lb (114.76 kg)  02/25/14 252 lb (114.306 kg)      Other studies Reviewed: Additional studies/ records that were reviewed today include: Exercise Tolerance Test and Lexiscan Myoview.  Review of the above records demonstrates: ST abnormalities/  a moderate-sized and intensity, reversible inferior and inferoapical perfusion defect suggestive of ischemia. Decreased EF of 48%.    ASSESSMENT AND PLAN:  1.  Abnormal Nuclear Stress Test: Intermediate risk stress nuclear study with a moderate-sized and intensity, reversible inferior and inferoapical perfusion defect suggestive of ischemia. LV Ejection Fraction: 48%. LV Wall Motion: Inferior hypokinesis. Also with multiple cardiac risk factors  including tobacco abuse, family history, HTN, HLD and T2DM. Recommend LHC for definitive assessment of coronaries. We discussed procedure in detail and patient is willing to proceed. Will arrange outpatient study at [facility].   2. Chronic Renal Insufficiency: Baseline SCr ~1.2. Most recent BMP was 02/10/14. Will recheck BMP today to see if any increase in SCr since last assessment, to see if he will need admission for pre-cath hydration. Patient advised to hold HCTZ and Metformin morning of procedure.   3. HTN: moderated elevated today at 162/94. Managed by PCP. Continue current meds.   4. HLD: Well controlled. Recent FLP showed LDL of 74 mg/ dL. Managed by PCP. On statin therapy.   5. T2DM: Well controlled. Managed by PCP. Recent Hgb A1c was 6.5.   6. Tobacco Abuse: smoking cessation strongly encouraged.   7. Anemia: recent CBC revealed slight anemia ~12. Also notes recent melena. Will check a CBC today to reassess H/H prior to cath.   8. ? Claudication: patient notes LEE pain with exercise. Also with h/o long term tobacco abuse and DM. Will check bilateral LE dopplers to r/o PVD. If abnormal, will refer to either Dr. Gwenlyn Found or Dr. Fletcher Anon.    Current  medicines are reviewed at length with the patient today.  The patient does not have concerns regarding medicines.  The following changes have been made:  no change  Labs/ tests ordered today include: CBC, BMP, bilateral LE dopplers and LHC  *NOTE: THE PATIENT WAS ALSO SEEN AND EXAMINED BY DR. Burt Knack, WHO IS IN AGREEMENT WITH THE ABOVE PLAN.   Disposition:   FU with Dr. Burt Knack for [facility] 03/18/14   Signed, Lyda Jester, PA-C  03/13/2014 10:54 AM    [address], [place], [place]  [address] Phone: [phone]; Fax: [phone]

## 2014-03-13 NOTE — Patient Instructions (Signed)
Your physician has requested that you have a lower or upper extremity arterial duplex. This test is an ultrasound of the arteries in the legs or arms. It looks at arterial blood flow in the legs and arms. Allow one hour for Lower and Upper Arterial scans. There are no restrictions or special instructions  Pre procedure labs today: BMET, CBCD  You are scheduled for a cardiac catheterization on 03/18/14 with Dr. Burt Knack.  Go to  [facility]   at 10:30 a.m.  Enter thru the [employer] entrance A No food or drink after midnight on 03/17/14. You may take your medications with a sip of water on the day of your procedure.   Hold your Metformin and Hydrochlorothiazide the day of your procedure

## 2014-03-18 ENCOUNTER — Ambulatory Visit (HOSPITAL_COMMUNITY)
Admission: RE | Admit: 2014-03-18 | Discharge: 2014-03-18 | Disposition: A | Discharge status: Home / Self Care | Payer: Commercial Managed Care - HMO | Source: Ambulatory Visit | Attending: Cardiovascular Disease | Admitting: Cardiovascular Disease

## 2014-03-18 ENCOUNTER — Encounter (HOSPITAL_COMMUNITY): Payer: Self-pay

## 2014-03-18 ENCOUNTER — Encounter (HOSPITAL_COMMUNITY)
Admission: RE | Disposition: A | Discharge status: Home / Self Care | Payer: Self-pay | Source: Ambulatory Visit | Attending: Cardiovascular Disease

## 2014-03-18 DIAGNOSIS — F17210 Nicotine dependence, cigarettes, uncomplicated: Secondary | ICD-10-CM | POA: Insufficient documentation

## 2014-03-18 DIAGNOSIS — Z7982 Long term (current) use of aspirin: Secondary | ICD-10-CM | POA: Insufficient documentation

## 2014-03-18 DIAGNOSIS — I129 Hypertensive chronic kidney disease with stage 1 through stage 4 chronic kidney disease, or unspecified chronic kidney disease: Secondary | ICD-10-CM | POA: Insufficient documentation

## 2014-03-18 DIAGNOSIS — Z7952 Long term (current) use of systemic steroids: Secondary | ICD-10-CM | POA: Insufficient documentation

## 2014-03-18 DIAGNOSIS — Z79891 Long term (current) use of opiate analgesic: Secondary | ICD-10-CM | POA: Diagnosis not present

## 2014-03-18 DIAGNOSIS — E785 Hyperlipidemia, unspecified: Secondary | ICD-10-CM | POA: Diagnosis not present

## 2014-03-18 DIAGNOSIS — G4700 Insomnia, unspecified: Secondary | ICD-10-CM | POA: Diagnosis not present

## 2014-03-18 DIAGNOSIS — E780 Pure hypercholesterolemia: Secondary | ICD-10-CM | POA: Insufficient documentation

## 2014-03-18 DIAGNOSIS — E119 Type 2 diabetes mellitus without complications: Secondary | ICD-10-CM | POA: Insufficient documentation

## 2014-03-18 DIAGNOSIS — D649 Anemia, unspecified: Secondary | ICD-10-CM | POA: Diagnosis not present

## 2014-03-18 DIAGNOSIS — I2582 Chronic total occlusion of coronary artery: Secondary | ICD-10-CM | POA: Diagnosis not present

## 2014-03-18 DIAGNOSIS — Z8249 Family history of ischemic heart disease and other diseases of the circulatory system: Secondary | ICD-10-CM | POA: Insufficient documentation

## 2014-03-18 DIAGNOSIS — I209 Angina pectoris, unspecified: Secondary | ICD-10-CM | POA: Insufficient documentation

## 2014-03-18 DIAGNOSIS — I2510 Atherosclerotic heart disease of native coronary artery without angina pectoris: Secondary | ICD-10-CM | POA: Diagnosis not present

## 2014-03-18 DIAGNOSIS — Z79899 Other long term (current) drug therapy: Secondary | ICD-10-CM | POA: Diagnosis not present

## 2014-03-18 DIAGNOSIS — R079 Chest pain, unspecified: Secondary | ICD-10-CM | POA: Diagnosis present

## 2014-03-18 DIAGNOSIS — N189 Chronic kidney disease, unspecified: Secondary | ICD-10-CM | POA: Insufficient documentation

## 2014-03-18 DIAGNOSIS — Z791 Long term (current) use of non-steroidal anti-inflammatories (NSAID): Secondary | ICD-10-CM | POA: Insufficient documentation

## 2014-03-18 DIAGNOSIS — R9439 Abnormal result of other cardiovascular function study: Secondary | ICD-10-CM | POA: Diagnosis present

## 2014-03-18 HISTORY — PX: LEFT HEART CATHETERIZATION WITH CORONARY ANGIOGRAM: SHX5451

## 2014-03-18 LAB — GLUCOSE, CAPILLARY
GLUCOSE-CAPILLARY: 92 mg/dL (ref 70–99)
Glucose-Capillary: 86 mg/dL (ref 70–99)

## 2014-03-18 LAB — PROTIME-INR
INR: 1.1 (ref 0.00–1.49)
Prothrombin Time: 14.3 seconds (ref 11.6–15.2)

## 2014-03-18 SURGERY — LEFT HEART CATHETERIZATION WITH CORONARY ANGIOGRAM
Anesthesia: LOCAL

## 2014-03-18 MED ORDER — ONDANSETRON HCL 4 MG/2ML IJ SOLN: 4.0000 mg | Freq: Four times a day (QID) | INTRAMUSCULAR | Status: DC | PRN

## 2014-03-18 MED ORDER — VERAPAMIL HCL 2.5 MG/ML IV SOLN
INTRAVENOUS | Status: AC
  Filled 2014-03-18: qty 2

## 2014-03-18 MED ORDER — SODIUM CHLORIDE 0.9 % IV SOLN
INTRAVENOUS | Status: DC
  Administered 2014-03-18: 11:00:00 via INTRAVENOUS

## 2014-03-18 MED ORDER — SODIUM CHLORIDE 0.9 % IJ SOLN: 3.0000 mL | INTRAMUSCULAR | Status: DC | PRN

## 2014-03-18 MED ORDER — LIDOCAINE HCL (PF) 1 % IJ SOLN
INTRAMUSCULAR | Status: AC
  Filled 2014-03-18: qty 30

## 2014-03-18 MED ORDER — ASPIRIN 81 MG PO CHEW
81.0000 mg | CHEWABLE_TABLET | ORAL | Status: AC
  Administered 2014-03-18: 81 mg via ORAL

## 2014-03-18 MED ORDER — FENTANYL CITRATE 0.05 MG/ML IJ SOLN
INTRAMUSCULAR | Status: AC
  Filled 2014-03-18: qty 2

## 2014-03-18 MED ORDER — MIDAZOLAM HCL 2 MG/2ML IJ SOLN
INTRAMUSCULAR | Status: AC
  Filled 2014-03-18: qty 2

## 2014-03-18 MED ORDER — ACETAMINOPHEN 325 MG PO TABS: 650.0000 mg | ORAL_TABLET | ORAL | Status: DC | PRN

## 2014-03-18 MED ORDER — HEPARIN SODIUM (PORCINE) 1000 UNIT/ML IJ SOLN
INTRAMUSCULAR | Status: AC
  Filled 2014-03-18: qty 1

## 2014-03-18 MED ORDER — SODIUM CHLORIDE 0.9 % IV SOLN
1.0000 mL/kg/h | INTRAVENOUS | Status: DC
  Administered 2014-03-18: 1 mL/kg/h via INTRAVENOUS

## 2014-03-18 MED ORDER — NITROGLYCERIN 1 MG/10 ML FOR IR/CATH LAB
INTRA_ARTERIAL | Status: AC
  Filled 2014-03-18: qty 10

## 2014-03-18 MED ORDER — SODIUM CHLORIDE 0.9 % IV SOLN: 250.0000 mL | INTRAVENOUS | Status: DC | PRN

## 2014-03-18 MED ORDER — HEPARIN (PORCINE) IN NACL 2-0.9 UNIT/ML-% IJ SOLN
INTRAMUSCULAR | Status: AC
  Filled 2014-03-18: qty 1000

## 2014-03-18 MED ORDER — SODIUM CHLORIDE 0.9 % IJ SOLN: 3.0000 mL | Freq: Two times a day (BID) | INTRAMUSCULAR | Status: DC

## 2014-03-18 MED ORDER — ASPIRIN 81 MG PO CHEW
CHEWABLE_TABLET | ORAL | Status: AC
  Filled 2014-03-18: qty 1

## 2014-03-18 NOTE — Interval H&P Note (Signed)
History and Physical Interval Note:  03/18/2014 1:33 PM  [name]  has presented today for surgery, with the diagnosis of abnormal stress test  The various methods of treatment have been discussed with the patient and family. After consideration of risks, benefits and other options for treatment, the patient has consented to  Procedure(s): LEFT HEART CATHETERIZATION WITH CORONARY ANGIOGRAM (N/A) as a surgical intervention .  The patient's history has been reviewed, patient examined, no change in status, stable for surgery.  I have reviewed the patient's chart and labs.  Questions were answered to the patient's satisfaction.    Cath Lab Visit (complete for each Cath Lab visit)  Clinical Evaluation Leading to the Procedure:   ACS: No.  Non-ACS:    Anginal Classification: CCS III  Anti-ischemic medical therapy: No Therapy  Non-Invasive Test Results: Intermediate-risk stress test findings: cardiac mortality 1-3%/year  Prior CABG: No previous CABG       [name]

## 2014-03-18 NOTE — CV Procedure (Signed)
    Cardiac Catheterization Procedure Note  Name: Shane [name] MRN: [id number] DOB: [birth year]  Procedure: Left Heart Cath, Selective Coronary Angiography, LV angiography  Indication: This is a 64 year old diabetic gentleman who presents with exertional chest pain and shortness of breath. He had an abnormal Myoview scan showing inferolateral ischemia and was referred for cardiac catheterization.   Procedural Details: The right wrist was prepped, draped, and anesthetized with 1% lidocaine. Using the modified Seldinger technique, a 5/6 French Slender sheath was introduced into the right radial artery. 3 mg of verapamil was administered through the sheath, weight-based unfractionated heparin was administered intravenously. Standard Judkins catheters were used for selective coronary angiography and left ventriculography. Catheter exchanges were performed over an exchange length guidewire. There were no immediate procedural complications. A TR band was used for radial hemostasis at the completion of the procedure.  The patient was transferred to the post catheterization recovery area for further monitoring.  Procedural Findings: Hemodynamics: AO 111/59 with a mean of 84 LV 112/9  Coronary angiography: Coronary dominance: right  Left mainstem: The left mainstem arises from left coronary cusp. There is moderate calcification. The left main has irregularity but no significant stenosis.  Left anterior descending (LAD): The LAD is severely diseased. The proximal LAD is patent, but at the origin of the first diagonal there is severe diffuse 80-90% stenosis with a plaque ulceration extending beyond the first septal perforating branch. The first diagonal is fairly large in caliber and it has a 90% stenosis. The mid and distal LAD are patent with mild irregularity and they appear to be a reasonable target for grafting.  Left circumflex (LCx): The left circumflex is large in caliber. The vessel is  calcified. The proximal circumflex has 75% stenosis. There is a long 50% stenosis beyond this. The first OM is large in caliber without significant stenosis. The second OM is small in caliber.  Right coronary artery (RCA): The RCA is calcified. The mid vessel is totally occluded. The PDA and PLA branches fill from left to right collaterals  Left ventriculography: The basal inferior wall is mildly hypokinetic. The other LV wall segments contract normally. The estimated LVEF is 55%.  Estimated Blood Loss: Minimal  Final Conclusions:   1. Three-vessel coronary artery disease with diffuse proximal LAD stenosis, moderately severe proximal left circumflex stenosis, and total occlusion of a dominant right coronary artery.  2. Mild segment contraction abnormality of the left ventricle with preserved overall LVEF  Recommendations: In this diabetic patient with chronic total occlusion and severe diffuse proximal LAD stenosis, I think he would be best treated with multivessel CABG. He also has significant stenosis of the first diagonal branch and left circumflex. Plan for outpatient cardiac surgery evaluation.  [name] MD, [facility] 03/18/2014, 2:12 PM

## 2014-03-18 NOTE — H&P (View-Only) (Signed)
Cardiology Office Note   Date:  03/13/2014   ID:  Shane [name], DOB 12-20-1950, MRN [id number]  PCP:  Renato Shin, MD  Cardiologist:  New (Dr. Burt Knack) Referring MD: Dr. Renato Shin   Chief Complaint  Patient presents with  . Abnormal ECG/ Abnormal Stress Test      History of Present Illness: Shane [name] is a 64 y.o. male, new to our office, who presents to clinic for evaluation after undergoing a nuclear stress test 02/25/14 that was abnormal. The test was ordered by his PCP Dr. Loanne Drilling. He initially underwent an exercise tolerance test which demonstrated borderline ST changes. It was recommended that he undergo further evaluation with a Lexiscan Myoview. This demonstrated a moderate-sized and intensity, reversible inferior and inferoapical perfusion defect suggestive of ischemia. EF was estimated at 48%. There was also inferior hypokinesis.  The patient's PMH is significant for 40+ year tobacco abuse, T2DM, HTN, HLD, chronic renal insufficiency w/ baseline SCr ~1.2 as well as strong family history of heart disease.   Today in clinic, he notes a 3 year history of intermittent left sided chest discomfort and dyspnea on exertion. Symptoms have worsened recently. He denies associated orthopnea, PND, LEE, dizziness, syncope/near syncope.  He also complains of right sided leg pain during ambulation. Symptoms are concerning for claudication. He continues to smoke daily. His DM is pretty well controlled. Recent Hgb A1c was 6.5. His cholesterol is well controlled with Lipitor. Recent LDL was 74 mg/dL.  He reports that he is due for a repeat colonoscopy at the end of this month. He has a h/o abnormal colonoscopy in the past with removal of precancerous polyps. He notes recent h/o of melena. Review of recent labs reveal drop in Hgb from 15.4 to 12.9 (02/10/14).      Past Medical History  Diagnosis Date  . DIABETES MELLITUS, TYPE II 09/25/2006    Qualifier: Diagnosis of  By: Loanne Drilling MD, Jacelyn Pi     . HYPERCHOLESTEROLEMIA 01/22/2007    Qualifier: Diagnosis of  By: Harvel Ricks    . HYPERTENSION 08/24/2006    Qualifier: Diagnosis of  By: Loanne Drilling MD, Sean A   . PSA, INCREASED 11/30/2009    Qualifier: Diagnosis of  By: Loanne Drilling MD, Jacelyn Pi RENAL INSUFFICIENCY 09/25/2006    Qualifier: Diagnosis of  By: Loanne Drilling MD, Sean A   . INSOMNIA 09/10/2008    Qualifier: Diagnosis of  By: Loanne Drilling MD, Jacelyn Pi Colon polyps 06/17/2011  . Diverticulosis 06/17/2011  . CIGARETTE SMOKER 11/30/2009    Qualifier: Diagnosis of  By: Loanne Drilling MD, Jacelyn Pi   . Hx of cardiovascular stress test     Lexiscan Myoview 3/16:  Inferior ischemia, EF 48%, Intermediate Risk    Past Surgical History  Procedure Laterality Date  . Hernia repair    . Tonsillectomy    . Colonoscopy    . Polypectomy       Current Outpatient Prescriptions  Medication Sig Dispense Refill  . aspirin 81 MG tablet Take 81 mg by mouth daily.    [name] Kitchen atorvastatin (LIPITOR) 80 MG tablet Take 1 tablet (80 mg total) by mouth daily. 90 tablet 3  . Blood Glucose Monitoring Suppl (ONETOUCH VERIO) W/DEVICE KIT 1 Device by Does not apply route once. 1 kit 0  . glucose blood (ONETOUCH VERIO) test strip 1 each by Other route daily. And lancets 1/day 100 each 12  . hydrochlorothiazide (MICROZIDE) 12.5 MG capsule TAKE ONE CAPSULE  BY MOUTH EVERY DAY 90 capsule 3  . hydrocortisone 1 % lotion Apply 1 application topically 3 (three) times daily. As needed for itching 118 mL 5  . metFORMIN (GLUCOPHAGE-XR) 500 MG 24 hr tablet TAKE ONE TABLET BY MOUTH ONCE DAILY 90 tablet 0  . NON FORMULARY Garlic-Take one daily    . omeprazole (PRILOSEC) 20 MG capsule Take 1 capsule (20 mg total) by mouth daily. 90 capsule 3  . sildenafil (REVATIO) 20 MG tablet Take 1 tablet (20 mg total) by mouth 3 (three) times daily. (Patient taking differently: Take 20 mg by mouth daily. ) 50 tablet 11  . traZODone (DESYREL) 150 MG tablet TAKE ONE TABLET BY MOUTH AT BEDTIME 90 tablet 2   . doxycycline (VIBRA-TABS) 100 MG tablet Take 1 tablet (100 mg total) by mouth 2 (two) times daily. (Patient not taking: Reported on 03/13/2014) 20 tablet 0   No current facility-administered medications for this visit.    Allergies:   Review of patient's allergies indicates no known allergies.    Social History:  The patient  reports that he has been smoking Cigarettes.  He has a 40 pack-year smoking history. He has never used smokeless tobacco. He reports that he drinks about 7.2 oz of alcohol per week. He reports that he does not use illicit drugs.   Family History:  The patient's family history includes Cancer in his father; Heart disease in his mother and sister; Hypertension in his father.    ROS:  Please see the history of present illness.   Otherwise, review of systems are positive for chest pain, DOE and claudication.   All other systems are reviewed and negative.    PHYSICAL EXAM: VS:  BP 162/94 mmHg  Pulse 76  Ht _0  (1.803 m)  Wt 255 lb (115.667 kg)  BMI 35.58 kg/m2 , BMI Body mass index is 35.58 kg/(m^2). GEN: Well nourished, well developed, in no acute distress HEENT: normal Neck: no JVD, carotid bruits, or masses Cardiac: RRR; no murmurs, rubs, or gallops,no edema  Respiratory:  clear to auscultation bilaterally, normal work of breathing GI: soft, nontender, nondistended, + BS MS: no deformity or atrophy Skin: warm and dry, no rash Neuro:  Strength and sensation are intact Psych: euthymic mood, full affect   EKG:  EKG is not ordered today.    Recent Labs: 02/10/2014: ALT 22; BUN 16; Creatinine 1.24; Hemoglobin 12.9*; Platelets 193.0; Potassium 4.1; Sodium 139; TSH 1.58    Lipid Panel    Component Value Date/Time   CHOL 126 02/10/2014 0917   TRIG 79.0 02/10/2014 0917   HDL 36.30* 02/10/2014 0917   CHOLHDL 3 02/10/2014 0917   VLDL 15.8 02/10/2014 0917   LDLCALC 74 02/10/2014 0917   LDLDIRECT 184.4 02/10/2013 0959      Wt Readings from Last 3  Encounters:  03/13/14 255 lb (115.667 kg)  03/10/14 253 lb (114.76 kg)  02/25/14 252 lb (114.306 kg)      Other studies Reviewed: Additional studies/ records that were reviewed today include: Exercise Tolerance Test and Lexiscan Myoview.  Review of the above records demonstrates: ST abnormalities/  a moderate-sized and intensity, reversible inferior and inferoapical perfusion defect suggestive of ischemia. Decreased EF of 48%.    ASSESSMENT AND PLAN:  1.  Abnormal Nuclear Stress Test: Intermediate risk stress nuclear study with a moderate-sized and intensity, reversible inferior and inferoapical perfusion defect suggestive of ischemia. LV Ejection Fraction: 48%. LV Wall Motion: Inferior hypokinesis. Also with multiple cardiac risk factors  including tobacco abuse, family history, HTN, HLD and T2DM. Recommend LHC for definitive assessment of coronaries. We discussed procedure in detail and patient is willing to proceed. Will arrange outpatient study at [facility].   2. Chronic Renal Insufficiency: Baseline SCr ~1.2. Most recent BMP was 02/10/14. Will recheck BMP today to see if any increase in SCr since last assessment, to see if he will need admission for pre-cath hydration. Patient advised to hold HCTZ and Metformin morning of procedure.   3. HTN: moderated elevated today at 162/94. Managed by PCP. Continue current meds.   4. HLD: Well controlled. Recent FLP showed LDL of 74 mg/ dL. Managed by PCP. On statin therapy.   5. T2DM: Well controlled. Managed by PCP. Recent Hgb A1c was 6.5.   6. Tobacco Abuse: smoking cessation strongly encouraged.   7. Anemia: recent CBC revealed slight anemia ~12. Also notes recent melena. Will check a CBC today to reassess H/H prior to cath.   8. ? Claudication: patient notes LEE pain with exercise. Also with h/o long term tobacco abuse and DM. Will check bilateral LE dopplers to r/o PVD. If abnormal, will refer to either Dr. Gwenlyn Found or Dr. Fletcher Anon.    Current  medicines are reviewed at length with the patient today.  The patient does not have concerns regarding medicines.  The following changes have been made:  no change  Labs/ tests ordered today include: CBC, BMP, bilateral LE dopplers and LHC  *NOTE: THE PATIENT WAS ALSO SEEN AND EXAMINED BY DR. Burt Knack, WHO IS IN AGREEMENT WITH THE ABOVE PLAN.   Disposition:   FU with Dr. Burt Knack for [facility] 03/18/14   Signed, Lyda Jester, PA-C  03/13/2014 10:54 AM    [address], [place], [place]  [address] Phone: [phone]; Fax: [phone]

## 2014-03-18 NOTE — Discharge Instructions (Signed)
Radial Site Care °Refer to this sheet in the next few weeks. These instructions provide you with information on caring for yourself after your procedure. Your caregiver may also give you more specific instructions. Your treatment has been planned according to current medical practices, but problems sometimes occur. Call your caregiver if you have any problems or questions after your procedure. °HOME CARE INSTRUCTIONS °· You may shower the day after the procedure. Remove the bandage (dressing) and gently wash the site with plain soap and water. Gently pat the site dry. °· Do not apply powder or lotion to the site. °· Do not submerge the affected site in water for 3 to 5 days. °· Inspect the site at least twice daily. °· Do not flex or bend the affected arm for 24 hours. °· No lifting over 5 pounds (2.3 kg) for 5 days after your procedure. °· Do not drive home if you are discharged the same day of the procedure. Have someone else drive you. °· You may drive 24 hours after the procedure unless otherwise instructed by your caregiver. °· Do not operate machinery or power tools for 24 hours. °· A responsible adult should be with you for the first 24 hours after you arrive home. °What to expect: °· Any bruising will usually fade within 1 to 2 weeks. °· Blood that collects in the tissue (hematoma) may be painful to the touch. It should usually decrease in size and tenderness within 1 to 2 weeks. °SEEK IMMEDIATE MEDICAL CARE IF: °· You have unusual pain at the radial site. °· You have redness, warmth, swelling, or pain at the radial site. °· You have drainage (other than a small amount of blood on the dressing). °· You have chills. °· You have a fever or persistent symptoms for more than 72 hours. °· You have a fever and your symptoms suddenly get worse. °· Your arm becomes pale, cool, tingly, or numb. °· You have heavy bleeding from the site. Hold pressure on the site. °Document Released: 01/28/2010 Document Revised:  03/20/2011 Document Reviewed: 01/28/2010 °[employer]® Patient Information ©2015 [employer]. This information is not intended to replace advice given to you by your health care provider. Make sure you discuss any questions you have with your health care provider. ° °

## 2014-03-19 ENCOUNTER — Encounter: Payer: Self-pay | Admitting: Cardiothoracic Surgery

## 2014-03-19 ENCOUNTER — Other Ambulatory Visit: Payer: Self-pay | Admitting: *Deleted

## 2014-03-19 ENCOUNTER — Institutional Professional Consult (permissible substitution) (INDEPENDENT_AMBULATORY_CARE_PROVIDER_SITE_OTHER): Payer: Commercial Managed Care - HMO | Admitting: Cardiothoracic Surgery

## 2014-03-19 ENCOUNTER — Encounter (HOSPITAL_COMMUNITY): Payer: Medicare HMO

## 2014-03-19 VITALS — BP 134/75 | HR 80 | Resp 20 | Ht 71.0 in | Wt 253.0 lb

## 2014-03-19 DIAGNOSIS — I2510 Atherosclerotic heart disease of native coronary artery without angina pectoris: Secondary | ICD-10-CM | POA: Diagnosis not present

## 2014-03-19 DIAGNOSIS — I209 Angina pectoris, unspecified: Secondary | ICD-10-CM | POA: Insufficient documentation

## 2014-03-19 NOTE — Progress Notes (Signed)
Patient ID: Shane [name], male   DOB: 12-08-1950, 64 y.o.   MRN: [id number]       [address]       [place] [address]             [id number]        Shane [name] [place] Medical Record #[id number] Date of Birth: Sep 12, 1950  Referring: [name], MD Primary Care: Renato Shin, MD  Chief Complaint:    Chief Complaint  Patient presents with  . Coronary Artery Disease    Surgical eval for possible CABG, Cardiac Cath 03/18/14  increasing shortness of breath with exertion  History of Present Illness:     Patient examined, cardiac catheterization  personally reviewed. 64 year old obese diabetic smoker developed symptoms of increasing dyspnea on exertion and decreased exercise tolerance progressive  over the past few months.He also has had exertional left-sided chest discomfort. His primary care physician scheduled a Myoview scan which showed reversible inferior ischemic areas with EF 45% and inferior hypokinesia.  Yesterday the patient underwent cardiac catheterization Which demonstrated ejection fraction of 50%, inferior hypokinesia, LVEDP 12, severe three-vessel coronary disease with chronic occlusion of the RCA high-grade stenosis of the LAD, diagonal, and circumflex marginal. Surgical coronary revascularization was recommended by Dr. Burt Knack. The patient had no complications following cardiac catheterization via right radial artery.  The patient denies family history of CABG. His diabetes is well-controlled with A1c 6.5. He is taking anti-lipid agent. He stopped smoking yesterday after cardiac catheterization -previously one pack per day.  Current Activity/ Functional Status: The patient is retired from working on home improvement. He is sedentary activity. He is married lives with his wife.   Zubrod Score: At the time of surgery this patient's most appropriate activity status/level should be described as: []     0    Normal activity, no symptoms []     1     Restricted in physical strenuous activity but ambulatory, able to do out light work [x]     2    Ambulatory and capable of self care, unable to do work activities, up and about                 more than 50%  Of the time                            []     3    Only limited self care, in bed greater than 50% of waking hours []     4    Completely disabled, no self care, confined to bed or chair []     5    Moribund  Past Medical History  Diagnosis Date  . DIABETES MELLITUS, TYPE II 09/25/2006    Qualifier: Diagnosis of  By: Loanne Drilling MD, Jacelyn Pi   . HYPERCHOLESTEROLEMIA 01/22/2007    Qualifier: Diagnosis of  By: Harvel Ricks    . HYPERTENSION 08/24/2006    Qualifier: Diagnosis of  By: Loanne Drilling MD, Sean A   . PSA, INCREASED 11/30/2009    Qualifier: Diagnosis of  By: Loanne Drilling MD, Jacelyn Pi RENAL INSUFFICIENCY 09/25/2006    Qualifier: Diagnosis of  By: Loanne Drilling MD, Sean A   . INSOMNIA 09/10/2008    Qualifier: Diagnosis of  By: Loanne Drilling MD, Jacelyn Pi Colon polyps 06/17/2011  . Diverticulosis 06/17/2011  . CIGARETTE SMOKER 11/30/2009    Qualifier: Diagnosis of  By: Loanne Drilling MD, Hilliard Clark  A   . Hx of cardiovascular stress test     Lexiscan Myoview 3/16:  Inferior ischemia, EF 48%, Intermediate Risk    Past Surgical History  Procedure Laterality Date  . Hernia repair    . Tonsillectomy    . Colonoscopy    . Polypectomy    . Left heart catheterization with coronary angiogram N/A 03/18/2014    Procedure: LEFT HEART CATHETERIZATION WITH CORONARY ANGIOGRAM;  Surgeon: [name], MD;  Location: [facility] CATH LAB;  Service: Cardiovascular;  Laterality: N/A;    History  Smoking status  . Current Every Day Smoker -- 1.00 packs/day for 40 years  . Types: Cigarettes  Smokeless tobacco  . Never Used   History  Alcohol Use  . 7.2 oz/week  . 12 Cans of beer per week    History   Social History  . Marital Status: Married    Spouse Name: N/A  . Number of Children: N/A  . Years of Education: N/A    Occupational History  . [employer]    Social History Main Topics  . Smoking status: Current Every Day Smoker -- 1.00 packs/day for 40 years    Types: Cigarettes  . Smokeless tobacco: Never Used  . Alcohol Use: 7.2 oz/week    12 Cans of beer per week  . Drug Use: No  . Sexual Activity: Not on file   Other Topics Concern  . 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 aspirin 81 MG tablet Take 81 mg by mouth daily.    [name] Kitchen atorvastatin (LIPITOR) 80 MG tablet Take 1 tablet (80 mg total) by mouth daily. 90 tablet 3  . hydrochlorothiazide (MICROZIDE) 12.5 MG capsule TAKE ONE CAPSULE BY MOUTH EVERY DAY 90 capsule 3  . hydrocortisone 1 % lotion Apply 1 application topically 3 (three) times daily. As needed for itching (Patient taking differently: Apply 1 application topically 3 (three) times daily as needed. As needed for itching) 118 mL 5  . ibuprofen (ADVIL,MOTRIN) 200 MG tablet Take 200 mg by mouth every 8 (eight) hours as needed for headache.    . metFORMIN (GLUCOPHAGE-XR) 500 MG 24 hr tablet TAKE ONE TABLET BY MOUTH ONCE DAILY 90 tablet 0  . NON FORMULARY Take 1 capsule by mouth daily. Garlic-Take one daily    . omeprazole (PRILOSEC) 20 MG capsule Take 1 capsule (20 mg total) by mouth daily. 90 capsule 3  . traZODone (DESYREL) 150 MG tablet TAKE ONE TABLET BY MOUTH AT BEDTIME (Patient taking differently: Take 150 mg by mouth at bedtime as needed for sleep. TAKE ONE TABLET BY MOUTH AT BEDTIME PRN) 90 tablet 2   No current facility-administered medications for this visit.     (Not in a hospital admission)  Family History  Problem Relation Age of Onset  . Cancer Father   . Hypertension Father   . Heart disease Mother   . Heart disease Sister      Review of Systems:  The patient is right-hand dominant Patient has had 3 previous abdominal  procedures for repair of umbilical hernia followed by mesh repair of recurrent umbilical hernia followed by repeat repair for infected mesh. No anesthetic or bleeding complications.  The patient had a colonoscopy 3 years ago and removed noncancerous polyps. He recently has had symptomatic hemorrhoids with blood with bowel movements. He is scheduled to  see his gastroenterologist later this month.  The patient is currently stopped smoking cigarettes but is using a electronic cigarette with nicotine. He drinks 1 quart of beer daily.     Cardiac Review of Systems: Y or N  Chest Pain [   yes ]  Resting SOB [ no  ] Exertional SOB  [place].Blacker  ]  Orthopnea [ no ]   Pedal Edema [ no  ]    Palpitations [no  ] Syncope  [  no]   Presyncope [ no]  General Review of Systems: [Y] = yes [  ]=no Constitional: recent weight change [weight up 5-10 pounds  ]; anorexia [  ]; fatigue [  ]; nausea [  ]; night sweats [  ]; fever [  ]; or chills [  ]                                                               Dental: poor dentition[  ]; Last Dentist visit: all dental plates  Eye : blurred vision [  ]; diplopia [   ]; vision changes [  ];  Amaurosis fugax[  ];history bilateral cataract surgery Resp: cough [  ];  wheezing[  ];  hemoptysis[  ]; shortness of breath[ yes ]; paroxysmal nocturnal dyspnea[  ]; dyspnea on exertion[yes  ]; or orthopnea[  ];  GI:  gallstones[  ], vomiting[  ];  dysphagia[  ]; melena[yes-recent painful hemorrhoids  ];  hematochezia [  ]; heartburn[  ];   Hx of  Colonoscopy[ yes thousand 13 ]; GU: kidney stones [  ]; hematuria[  ];   dysuria [  ];  nocturia[  ];  history of     obstruction [  ]; urinary frequency [  ]             Skin: rash, swelling[  ];, hair loss[  ];  peripheral edema[  ];  or itching[  ]; Musculosketetal: myalgias[  ];  joint swelling[  ];  joint erythema[  ];  joint pain[  ];  back pain[  ];  Heme/Lymph: bruising[  ];  bleeding[  ];  anemia[  ];  Neuro: TIA[  ];   Headaches[yes-mild takes Advil  ];  stroke[  ];  vertigo[  ];  seizures[  ];   paresthesias[  ];  difficulty walking[  ];  Psych:depression[  ]; anxiety[  ];  Endocrine: diabetes[yes-checks blood sugars at home  ];  thyroid dysfunction[  ];  Immunizations: Flu [  ]; Pneumococcal[  ];  Other:  Physical Exam: BP 134/75 mmHg  Pulse 80  Resp 20  Ht 5\' 11"  (1.803 m)  Wt 253 lb (114.76 kg)  BMI 35.30 kg/m2  SpO2 97%    General: obese middle-aged [place] American male no acute distress accompanied by his wife HEENT: Normocephalic pupils equal , dentition with full upper and lower plates Neck: Supple without JVD, adenopathy, or bruit Chest: Clear to auscultation, symmetrical breath sounds, no rhonchi, no tenderness             or deformity Cardiovascular: Regular rate and rhythm, no murmur, no gallop, peripheral pulses             palpable in all extremities, compression dressing over right radial artery without hematoma  Abdomen:  Soft, obese,nontender, no palpable mass or organomegaly, well-healed lower midline surgical incision Extremities: Warm, well-perfused, no clubbing cyanosis edema or tenderness,              no venous stasis changes of the legs Rectal/GU: Deferred Neuro: Grossly non--focal and symmetrical throughout Skin: Clean and dry without rash or ulceration   Diagnostic Studies & Laboratory data:     Recent Radiology Findings:  Chest x-ray pending Echocardiogram pending Coronary angiograms personally reviewed and discussed with patient    I have independently reviewed the above radiologic studies.  Recent Lab Findings: Lab Results  Component Value Date   WBC 9.2 03/13/2014   HGB 12.9* 03/13/2014   HCT 38.2* 03/13/2014   PLT 190.0 03/13/2014   GLUCOSE 81 03/13/2014   CHOL 126 02/10/2014   TRIG 79.0 02/10/2014   HDL 36.30* 02/10/2014   LDLDIRECT 184.4 02/10/2013   LDLCALC 74 02/10/2014   ALT 22 02/10/2014   AST 23 02/10/2014   NA 138 03/13/2014   K 4.2  03/13/2014   CL 104 03/13/2014   CREATININE 1.13 03/13/2014   BUN 15 03/13/2014   CO2 30 03/13/2014   TSH 1.58 02/10/2014   INR 1.10 03/18/2014   HGBA1C 6.4 02/10/2014      Assessment / Plan:     64 year old obese diabetic smoker with class III symptoms of angina. Severe three-vessel CAD with mild LV dysfunction Active smoking-PFTs pending  Plan multivessel CABG Monday, March 14 at [facility]. The procedure indications benefits alternatives and risks were fully discussed with the patient and his wife. The patient understands the risks of stroke, MI, bleeding requiring transfusion, postoperative pulmonary problems including pleural effusion, postoperative arrhythmia requiring pacemaker, and death.     I  spent 60 mincounseling the patient face to face and 50% or more the  time was spent in counseling and coordination of care. The total time spent in the appointment was 90 min    @ME1 @ 03/19/2014 11:45 AM

## 2014-03-19 NOTE — Pre-Procedure Instructions (Signed)
Shane [name]  03/19/2014   Your procedure is scheduled on: Monday, March 23, 2014  Report to [facility] Admitting at 5:30 AM.  Call this number if you have problems the morning of surgery: [phone]   Remember: Shane [name] ( breathing device) and cardiac teaching packet on day of admission   Do not eat food or drink liquids after midnight Sunday, March 22, 2014   Take these medicines the morning of surgery with A SIP OF WATER: omeprazole (PRILOSEC)  DO NOT TAKE ANY DIABETIC MEDICATION THE MORNING OF PROCEDURE ( MetFORMIN (GLUCOPHAGE-XR)   Stop taking vitamins, herbal medications ( Garlic) and NSAIDs ie: Ibuprofen, Advil, Naproxen; stop now  Do not wear jewelry, make-up or nail polish.  Do not wear lotions, powders, or perfumes. You may not wear deodorant.  Do not shave 48 hours prior to surgery. Men may shave face and neck.  Do not bring valuables to the hospital.  [facility] is not responsible for any belongings or valuables.               Contacts, dentures or bridgework may not be worn into surgery.  Leave suitcase in the car. After surgery it may be brought to your room.  For patients admitted to the hospital, discharge time is determined by your treatment team.               Patients discharged the day of surgery will not be allowed to drive home.  Name and phone number of your driver:   Special Instructions:  Special Instructions:Special Instructions: [facility] - Preparing for Surgery  Before surgery, you can play an important role.  Because skin is not sterile, your skin needs to be as free of germs as possible.  You can reduce the number of germs on you skin by washing with CHG (chlorahexidine gluconate) soap before surgery.  CHG is an antiseptic cleaner which kills germs and bonds with the skin to continue killing germs even after washing.  Please DO NOT use if you have an allergy to CHG or antibacterial soaps.  If your skin becomes  reddened/irritated stop using the CHG and inform your nurse when you arrive at Short Stay.  Do not shave (including legs and underarms) for at least 48 hours prior to the first CHG shower.  You may shave your face.  Please follow these instructions carefully:   1.  Shower with CHG Soap the night before surgery and the morning of Surgery.  2.  If you choose to wash your hair, wash your hair first as usual with your normal shampoo.  3.  After you shampoo, rinse your hair and body thoroughly to remove the Shampoo.  4.  Use CHG as you would any other liquid soap.  You can apply chg directly  to the skin and wash gently with scrungie or a clean washcloth.  5.  Apply the CHG Soap to your body ONLY FROM THE NECK DOWN.  Do not use on open wounds or open sores.  Avoid contact with your eyes, ears, mouth and genitals (private parts).  Wash genitals (private parts) with your normal soap.  6.  Wash thoroughly, paying special attention to the area where your surgery will be performed.  7.  Thoroughly rinse your body with warm water from the neck down.  8.  DO NOT shower/wash with your normal soap after using and rinsing off the CHG Soap.  9.  Pat yourself dry with a clean  towel.            10.  Wear clean pajamas.            11.  Place clean sheets on your bed the night of your first shower and do not sleep with pets.  Day of Surgery  Do not apply any lotions/deodorants the morning of surgery.  Please wear clean clothes to the hospital/surgery center.   Please read over the following fact sheets that you were given: Pain Booklet, Coughing and Deep Breathing, Blood Transfusion Information, Open Heart Packet, MRSA Information and Surgical Site Infection Prevention

## 2014-03-20 ENCOUNTER — Encounter (HOSPITAL_COMMUNITY): Payer: Commercial Managed Care - HMO

## 2014-03-20 ENCOUNTER — Ambulatory Visit (HOSPITAL_COMMUNITY)
Admission: RE | Admit: 2014-03-20 | Discharge: 2014-03-20 | Disposition: A | Discharge status: Home / Self Care | Payer: Commercial Managed Care - HMO | Source: Ambulatory Visit | Attending: Cardiothoracic Surgery | Admitting: Cardiothoracic Surgery

## 2014-03-20 ENCOUNTER — Encounter (HOSPITAL_COMMUNITY)
Admission: RE | Admit: 2014-03-20 | Discharge: 2014-03-20 | Disposition: A | Discharge status: Home / Self Care | Payer: Commercial Managed Care - HMO | Source: Ambulatory Visit | Attending: Cardiothoracic Surgery | Admitting: Cardiothoracic Surgery

## 2014-03-20 ENCOUNTER — Encounter (HOSPITAL_COMMUNITY): Payer: Self-pay

## 2014-03-20 VITALS — BP 135/63 | HR 90 | Temp 98.1°F | Resp 20 | Ht 71.0 in | Wt 244.7 lb

## 2014-03-20 DIAGNOSIS — I2510 Atherosclerotic heart disease of native coronary artery without angina pectoris: Secondary | ICD-10-CM

## 2014-03-20 DIAGNOSIS — F17200 Nicotine dependence, unspecified, uncomplicated: Secondary | ICD-10-CM | POA: Diagnosis present

## 2014-03-20 DIAGNOSIS — R05 Cough: Secondary | ICD-10-CM

## 2014-03-20 DIAGNOSIS — E780 Pure hypercholesterolemia: Secondary | ICD-10-CM | POA: Diagnosis present

## 2014-03-20 DIAGNOSIS — N179 Acute kidney failure, unspecified: Secondary | ICD-10-CM | POA: Diagnosis not present

## 2014-03-20 DIAGNOSIS — E119 Type 2 diabetes mellitus without complications: Secondary | ICD-10-CM | POA: Diagnosis present

## 2014-03-20 DIAGNOSIS — I25119 Atherosclerotic heart disease of native coronary artery with unspecified angina pectoris: Secondary | ICD-10-CM | POA: Diagnosis present

## 2014-03-20 DIAGNOSIS — J9811 Atelectasis: Secondary | ICD-10-CM | POA: Diagnosis not present

## 2014-03-20 DIAGNOSIS — J449 Chronic obstructive pulmonary disease, unspecified: Secondary | ICD-10-CM | POA: Diagnosis present

## 2014-03-20 DIAGNOSIS — K567 Ileus, unspecified: Secondary | ICD-10-CM | POA: Diagnosis not present

## 2014-03-20 DIAGNOSIS — Z951 Presence of aortocoronary bypass graft: Secondary | ICD-10-CM | POA: Diagnosis not present

## 2014-03-20 DIAGNOSIS — I10 Essential (primary) hypertension: Secondary | ICD-10-CM | POA: Diagnosis present

## 2014-03-20 DIAGNOSIS — R0602 Shortness of breath: Secondary | ICD-10-CM | POA: Diagnosis not present

## 2014-03-20 DIAGNOSIS — Z01818 Encounter for other preprocedural examination: Secondary | ICD-10-CM

## 2014-03-20 DIAGNOSIS — I25110 Atherosclerotic heart disease of native coronary artery with unstable angina pectoris: Secondary | ICD-10-CM | POA: Diagnosis present

## 2014-03-20 DIAGNOSIS — F17210 Nicotine dependence, cigarettes, uncomplicated: Secondary | ICD-10-CM

## 2014-03-20 DIAGNOSIS — Z9889 Other specified postprocedural states: Secondary | ICD-10-CM | POA: Diagnosis not present

## 2014-03-20 DIAGNOSIS — R0902 Hypoxemia: Secondary | ICD-10-CM | POA: Diagnosis not present

## 2014-03-20 DIAGNOSIS — E8770 Fluid overload, unspecified: Secondary | ICD-10-CM | POA: Diagnosis not present

## 2014-03-20 DIAGNOSIS — D696 Thrombocytopenia, unspecified: Secondary | ICD-10-CM | POA: Diagnosis not present

## 2014-03-20 DIAGNOSIS — D62 Acute posthemorrhagic anemia: Secondary | ICD-10-CM | POA: Diagnosis not present

## 2014-03-20 DIAGNOSIS — R079 Chest pain, unspecified: Secondary | ICD-10-CM | POA: Diagnosis not present

## 2014-03-20 HISTORY — DX: Presence of spectacles and contact lenses: Z97.3

## 2014-03-20 HISTORY — DX: Gastro-esophageal reflux disease without esophagitis: K21.9

## 2014-03-20 HISTORY — DX: Unspecified asthma, uncomplicated: J45.909

## 2014-03-20 HISTORY — DX: Tinnitus, unspecified ear: H93.19

## 2014-03-20 HISTORY — DX: Atherosclerotic heart disease of native coronary artery without angina pectoris: I25.10

## 2014-03-20 LAB — BLOOD GAS, ARTERIAL
Acid-Base Excess: 0.3 mmol/L (ref 0.0–2.0)
Bicarbonate: 24 mEq/L (ref 20.0–24.0)
Drawn by: 434221
FIO2: 0.21 %
O2 Saturation: 97.8 %
Patient temperature: 98.6
TCO2: 25.1 mmol/L (ref 0–100)
pCO2 arterial: 35.9 mmHg (ref 35.0–45.0)
pH, Arterial: 7.44 (ref 7.350–7.450)
pO2, Arterial: 102 mmHg — ABNORMAL HIGH (ref 80.0–100.0)

## 2014-03-20 LAB — PULMONARY FUNCTION TEST
DL/VA % pred: 98 %
DL/VA: 4.56 ml/min/mmHg/L
DLCO cor % pred: 75 %
DLCO cor: 24.49 ml/min/mmHg
DLCO unc % pred: 72 %
DLCO unc: 23.47 ml/min/mmHg
FEF 25-75 Post: 2.58 L/sec
FEF 25-75 Pre: 2 L/sec
FEF2575-%Change-Post: 29 %
FEF2575-%Pred-Post: 97 %
FEF2575-%Pred-Pre: 75 %
FEV1-%Change-Post: 6 %
FEV1-%Pred-Post: 89 %
FEV1-%Pred-Pre: 84 %
FEV1-Post: 2.66 L
FEV1-Pre: 2.51 L
FEV1FVC-%Change-Post: 1 %
FEV1FVC-%Pred-Pre: 99 %
FEV6-%Change-Post: 4 %
FEV6-%Pred-Post: 90 %
FEV6-%Pred-Pre: 87 %
FEV6-Post: 3.4 L
FEV6-Pre: 3.26 L
FEV6FVC-%Change-Post: 0 %
FEV6FVC-%Pred-Post: 103 %
FEV6FVC-%Pred-Pre: 103 %
FVC-%Change-Post: 4 %
FVC-%Pred-Post: 88 %
FVC-%Pred-Pre: 84 %
FVC-Post: 3.44 L
FVC-Pre: 3.29 L
Post FEV1/FVC ratio: 77 %
Post FEV6/FVC ratio: 99 %
Pre FEV1/FVC ratio: 76 %
Pre FEV6/FVC Ratio: 99 %
RV % pred: 85 %
RV: 2.04 L
TLC % pred: 80 %
TLC: 5.63 L

## 2014-03-20 LAB — COMPREHENSIVE METABOLIC PANEL
ALT: 29 U/L (ref 0–53)
AST: 28 U/L (ref 0–37)
Albumin: 3.8 g/dL (ref 3.5–5.2)
Alkaline Phosphatase: 113 U/L (ref 39–117)
Anion gap: 12 (ref 5–15)
BUN: 18 mg/dL (ref 6–23)
CO2: 22 mmol/L (ref 19–32)
Calcium: 9.1 mg/dL (ref 8.4–10.5)
Chloride: 105 mmol/L (ref 96–112)
Creatinine, Ser: 1.16 mg/dL (ref 0.50–1.35)
GFR calc Af Amer: 74 mL/min — ABNORMAL LOW (ref >90)
GFR calc non Af Amer: 64 mL/min — ABNORMAL LOW (ref >90)
Glucose, Bld: 97 mg/dL (ref 70–99)
Potassium: 4.6 mmol/L (ref 3.5–5.1)
Sodium: 139 mmol/L (ref 135–145)
Total Bilirubin: 0.5 mg/dL (ref 0.3–1.2)
Total Protein: 7 g/dL (ref 6.0–8.3)

## 2014-03-20 LAB — URINALYSIS, ROUTINE W REFLEX MICROSCOPIC
Bilirubin Urine: NEGATIVE
Glucose, UA: NEGATIVE mg/dL
Hgb urine dipstick: NEGATIVE
Ketones, ur: NEGATIVE mg/dL
Leukocytes, UA: NEGATIVE
Nitrite: NEGATIVE
Protein, ur: NEGATIVE mg/dL
Specific Gravity, Urine: 1.018 (ref 1.005–1.030)
Urobilinogen, UA: 1 mg/dL (ref 0.0–1.0)
pH: 5.5 (ref 5.0–8.0)

## 2014-03-20 LAB — TYPE AND SCREEN
ABO/RH(D): B POS
Antibody Screen: NEGATIVE

## 2014-03-20 LAB — CBC
HCT: 38.7 % — ABNORMAL LOW (ref 39.0–52.0)
Hemoglobin: 12.8 g/dL — ABNORMAL LOW (ref 13.0–17.0)
MCH: 26.9 pg (ref 26.0–34.0)
MCHC: 33.1 g/dL (ref 30.0–36.0)
MCV: 81.5 fL (ref 78.0–100.0)
Platelets: 203 10*3/uL (ref 150–400)
RBC: 4.75 MIL/uL (ref 4.22–5.81)
RDW: 15.5 % (ref 11.5–15.5)
WBC: 9.4 10*3/uL (ref 4.0–10.5)

## 2014-03-20 LAB — ABO/RH: ABO/RH(D): B POS

## 2014-03-20 LAB — SURGICAL PCR SCREEN
MRSA, PCR: NEGATIVE
Staphylococcus aureus: NEGATIVE

## 2014-03-20 LAB — PROTIME-INR
INR: 1.06 (ref 0.00–1.49)
Prothrombin Time: 13.9 seconds (ref 11.6–15.2)

## 2014-03-20 LAB — APTT: aPTT: 27 seconds (ref 24–37)

## 2014-03-20 MED ORDER — ALBUTEROL SULFATE (2.5 MG/3ML) 0.083% IN NEBU
2.5000 mg | INHALATION_SOLUTION | Freq: Once | RESPIRATORY_TRACT | Status: AC
Start: 2014-03-20 — End: 2014-03-20
  Administered 2014-03-20: 2.5 mg via RESPIRATORY_TRACT

## 2014-03-20 NOTE — Progress Notes (Signed)
  Echocardiogram 2D Echocardiogram has been performed.  Beronica Lansdale FRANCES 03/20/2014, 1:35 PM

## 2014-03-20 NOTE — Progress Notes (Signed)
VASCULAR LAB PRELIMINARY  PRELIMINARY  PRELIMINARY  PRELIMINARY  Pre-op Cardiac Surgery  Carotid Findings:  Bilateral:  1-39% ICA stenosis.  Vertebral artery flow is antegrade.     Upper Extremity Right Left  Brachial Pressures 126 Triphasic 127 Triphasic  Radial Waveforms Triphasic Triphasic  Ulnar Waveforms Triphasic Triphasic  Palmar Arch (Allen's Test) Abnormal Abnormal   Findings:  Right Doppler waveforms remained normal with radial compression and reverse with ulnar compression.                   Left Doppler waveforms remained normal with radial compression and obliterated with ulnar compression.    Lower  Extremity Right Left  Dorsalis Pedis 143 Triphasic 127 Triphasic  Posterior Tibial 141 Triphasic 156 Triphasic  Ankle/Brachial Indices 1.13 1.23    Findings:  ABIs and Doppler waveforms are within normal limits bilaterally at rest.   Shane [name], RVS 03/20/2014, 2:03 PM   2

## 2014-03-20 NOTE — Progress Notes (Signed)
   03/20/14 0839  OBSTRUCTIVE SLEEP APNEA  Have you ever been diagnosed with sleep apnea through a sleep study? No  Do you snore loudly (loud enough to be heard through closed doors)?  1  Do you often feel tired, fatigued, or sleepy during the daytime? 1  Has anyone observed you stop breathing during your sleep? 0  Do you have, or are you being treated for high blood pressure? 1  BMI more than 35 kg/m2? 0  Age over 66 years old? 1  Neck circumference greater than 40 cm/16 inches? 1  Gender: 1  Obstructive Sleep Apnea Score 6

## 2014-03-20 NOTE — Progress Notes (Signed)
   03/20/14 0839  OBSTRUCTIVE SLEEP APNEA  Have you ever been diagnosed with sleep apnea through a sleep study? No  Do you snore loudly (loud enough to be heard through closed doors)?  1  Do you often feel tired, fatigued, or sleepy during the daytime? 1  Has anyone observed you stop breathing during your sleep? 0  Do you have, or are you being treated for high blood pressure? 1  BMI more than 35 kg/m2? 0  Age over 66 years old? 1  Neck circumference greater than 40 cm/16 inches? 1  Gender: 1

## 2014-03-20 NOTE — Progress Notes (Signed)
Pt denies SOB and chest pain. Pt is under the care of Dr. Burt Knack at [facility]. Pt PCP is Dr. Larae Grooms, MD made aware of STOP BANG ASSESSMENT results.

## 2014-03-21 LAB — HEMOGLOBIN A1C
Hgb A1c MFr Bld: 5.9 % — ABNORMAL HIGH (ref 4.8–5.6)
Mean Plasma Glucose: 123 mg/dL

## 2014-03-22 MED ORDER — METOPROLOL TARTRATE 12.5 MG HALF TABLET: 12.5000 mg | ORAL_TABLET | Freq: Once | ORAL | Status: DC

## 2014-03-22 MED ORDER — SODIUM CHLORIDE 0.9 % IV SOLN
INTRAVENOUS | Status: DC
  Filled 2014-03-22: qty 30

## 2014-03-22 MED ORDER — VANCOMYCIN HCL 10 G IV SOLR
1500.0000 mg | INTRAVENOUS | Status: AC
  Administered 2014-03-23: 1500 mg via INTRAVENOUS
  Filled 2014-03-22 (×2): qty 1500

## 2014-03-22 MED ORDER — PLASMA-LYTE 148 IV SOLN
INTRAVENOUS | Status: AC
  Administered 2014-03-23: 500 mL
  Filled 2014-03-22: qty 2.5

## 2014-03-22 MED ORDER — SODIUM CHLORIDE 0.9 % IV SOLN
INTRAVENOUS | Status: DC
  Filled 2014-03-22: qty 2.5

## 2014-03-22 MED ORDER — CEFUROXIME SODIUM 1.5 G IJ SOLR
1.5000 g | INTRAMUSCULAR | Status: AC
  Administered 2014-03-23: 1.5 g via INTRAVENOUS
  Administered 2014-03-23: .75 g via INTRAVENOUS
  Filled 2014-03-22 (×2): qty 1.5

## 2014-03-22 MED ORDER — DEXMEDETOMIDINE HCL IN NACL 400 MCG/100ML IV SOLN
0.1000 ug/kg/h | INTRAVENOUS | Status: DC
  Filled 2014-03-22: qty 100

## 2014-03-22 MED ORDER — DOPAMINE-DEXTROSE 3.2-5 MG/ML-% IV SOLN
0.0000 ug/kg/min | INTRAVENOUS | Status: DC
  Filled 2014-03-22: qty 250

## 2014-03-22 MED ORDER — DEXTROSE 5 % IV SOLN
750.0000 mg | INTRAVENOUS | Status: DC
  Filled 2014-03-22: qty 750

## 2014-03-22 MED ORDER — POTASSIUM CHLORIDE 2 MEQ/ML IV SOLN
80.0000 meq | INTRAVENOUS | Status: DC
  Filled 2014-03-22: qty 40

## 2014-03-22 MED ORDER — SODIUM CHLORIDE 0.9 % IV SOLN
INTRAVENOUS | Status: DC
  Filled 2014-03-22: qty 40

## 2014-03-22 MED ORDER — PHENYLEPHRINE HCL 10 MG/ML IJ SOLN
30.0000 ug/min | INTRAVENOUS | Status: DC
  Administered 2014-03-23: 5 ug/min via INTRAVENOUS
  Filled 2014-03-22: qty 2

## 2014-03-22 MED ORDER — MAGNESIUM SULFATE 50 % IJ SOLN
40.0000 meq | INTRAMUSCULAR | Status: DC
  Filled 2014-03-22: qty 10

## 2014-03-22 MED ORDER — CHLORHEXIDINE GLUCONATE 4 % EX LIQD
30.0000 mL | CUTANEOUS | Status: DC
  Filled 2014-03-22: qty 30

## 2014-03-22 MED ORDER — NITROGLYCERIN IN D5W 200-5 MCG/ML-% IV SOLN
2.0000 ug/min | INTRAVENOUS | Status: DC
  Filled 2014-03-22: qty 250

## 2014-03-22 MED ORDER — EPINEPHRINE HCL 1 MG/ML IJ SOLN
0.0000 ug/min | INTRAVENOUS | Status: DC
  Filled 2014-03-22: qty 4

## 2014-03-22 NOTE — Anesthesia Preprocedure Evaluation (Addendum)
Anesthesia Evaluation  Patient identified by MRN, date of birth, ID band Patient awake    Reviewed: Allergy & Precautions, NPO status , Patient's Chart, lab work & pertinent test results  Airway Mallampati: II  TM Distance: >3 FB Neck ROM: Full    Dental no notable dental hx. (+) Edentulous Upper, Edentulous Lower, Dental Advisory Given   Pulmonary asthma , Current Smoker, former smoker,  breath sounds clear to auscultation  Pulmonary exam normal       Cardiovascular hypertension, Pt. on medications + CAD Rhythm:Regular Rate:Normal  Echo 03/2014 - Left ventricle: The cavity size was normal. Wall thickness wasnormal. Systolic function was normal. The estimated ejectionfraction was in the range of 50% to 55%. Doppler parameters areconsistent with abnormal left ventricular relaxation (grade 1diastolic dysfunction).     Neuro/Psych negative neurological ROS  negative psych ROS   GI/Hepatic Neg liver ROS, GERD-  Medicated and Controlled,  Endo/Other  diabetes, Well Controlled, Type 2, Oral Hypoglycemic Agents  Renal/GU Renal disease     Musculoskeletal negative musculoskeletal ROS (+)   Abdominal   Peds  Hematology negative hematology ROS (+)   Anesthesia Other Findings   Reproductive/Obstetrics                         Anesthesia Physical Anesthesia Plan  ASA: IV  Anesthesia Plan: General   Post-op Pain Management:    Induction: Intravenous  Airway Management Planned: Oral ETT  Additional Equipment: Arterial line, PA Cath, CVP, TEE and Ultrasound Guidance Line Placement  Intra-op Plan:   Post-operative Plan: Post-operative intubation/ventilation  Informed Consent: I have reviewed the patients History and Physical, chart, labs and discussed the procedure including the risks, benefits and alternatives for the proposed anesthesia with the patient or authorized representative who has  indicated his/her understanding and acceptance.   Dental advisory given  Plan Discussed with: CRNA, Anesthesiologist and Surgeon  Anesthesia Plan Comments:        Anesthesia Quick Evaluation

## 2014-03-23 ENCOUNTER — Inpatient Hospital Stay (HOSPITAL_COMMUNITY)
Admission: RE | Admit: 2014-03-23 | Discharge: 2014-03-29 | DRG: 236 | Disposition: A | Discharge status: Home Health | Payer: Commercial Managed Care - HMO | Source: Ambulatory Visit | Attending: Cardiothoracic Surgery | Admitting: Cardiothoracic Surgery

## 2014-03-23 ENCOUNTER — Inpatient Hospital Stay (HOSPITAL_COMMUNITY): Payer: Commercial Managed Care - HMO | Admitting: Anesthesiology

## 2014-03-23 ENCOUNTER — Encounter (HOSPITAL_COMMUNITY)
Admission: RE | Disposition: A | Discharge status: Home Health | Payer: Commercial Managed Care - HMO | Source: Ambulatory Visit | Attending: Cardiothoracic Surgery

## 2014-03-23 ENCOUNTER — Inpatient Hospital Stay (HOSPITAL_COMMUNITY): Payer: Commercial Managed Care - HMO

## 2014-03-23 DIAGNOSIS — E780 Pure hypercholesterolemia: Secondary | ICD-10-CM | POA: Diagnosis present

## 2014-03-23 DIAGNOSIS — I25119 Atherosclerotic heart disease of native coronary artery with unspecified angina pectoris: Secondary | ICD-10-CM | POA: Diagnosis present

## 2014-03-23 DIAGNOSIS — J449 Chronic obstructive pulmonary disease, unspecified: Secondary | ICD-10-CM | POA: Diagnosis present

## 2014-03-23 DIAGNOSIS — E8770 Fluid overload, unspecified: Secondary | ICD-10-CM | POA: Diagnosis not present

## 2014-03-23 DIAGNOSIS — Z951 Presence of aortocoronary bypass graft: Secondary | ICD-10-CM

## 2014-03-23 DIAGNOSIS — I2510 Atherosclerotic heart disease of native coronary artery without angina pectoris: Secondary | ICD-10-CM

## 2014-03-23 DIAGNOSIS — I25110 Atherosclerotic heart disease of native coronary artery with unstable angina pectoris: Principal | ICD-10-CM | POA: Diagnosis present

## 2014-03-23 DIAGNOSIS — N179 Acute kidney failure, unspecified: Secondary | ICD-10-CM | POA: Diagnosis not present

## 2014-03-23 DIAGNOSIS — K567 Ileus, unspecified: Secondary | ICD-10-CM

## 2014-03-23 DIAGNOSIS — D696 Thrombocytopenia, unspecified: Secondary | ICD-10-CM | POA: Diagnosis not present

## 2014-03-23 DIAGNOSIS — D62 Acute posthemorrhagic anemia: Secondary | ICD-10-CM | POA: Diagnosis not present

## 2014-03-23 DIAGNOSIS — I10 Essential (primary) hypertension: Secondary | ICD-10-CM | POA: Diagnosis present

## 2014-03-23 DIAGNOSIS — F17200 Nicotine dependence, unspecified, uncomplicated: Secondary | ICD-10-CM | POA: Diagnosis present

## 2014-03-23 DIAGNOSIS — E119 Type 2 diabetes mellitus without complications: Secondary | ICD-10-CM | POA: Diagnosis present

## 2014-03-23 HISTORY — PX: TEE WITHOUT CARDIOVERSION: SHX5443

## 2014-03-23 HISTORY — PX: CORONARY ARTERY BYPASS GRAFT: SHX141

## 2014-03-23 LAB — CBC
HCT: 33.3 % — ABNORMAL LOW (ref 39.0–52.0)
HEMATOCRIT: 32.9 % — AB (ref 39.0–52.0)
HEMOGLOBIN: 10.7 g/dL — AB (ref 13.0–17.0)
Hemoglobin: 10.7 g/dL — ABNORMAL LOW (ref 13.0–17.0)
MCH: 26.5 pg (ref 26.0–34.0)
MCH: 26.2 pg (ref 26.0–34.0)
MCHC: 32.5 g/dL (ref 30.0–36.0)
MCHC: 32.1 g/dL (ref 30.0–36.0)
MCV: 81.4 fL (ref 78.0–100.0)
MCV: 81.6 fL (ref 78.0–100.0)
Platelets: 148 10*3/uL — ABNORMAL LOW (ref 150–400)
Platelets: 154 10*3/uL (ref 150–400)
RBC: 4.04 MIL/uL — ABNORMAL LOW (ref 4.22–5.81)
RBC: 4.08 MIL/uL — ABNORMAL LOW (ref 4.22–5.81)
RDW: 15.2 % (ref 11.5–15.5)
RDW: 15.2 % (ref 11.5–15.5)
WBC: 13.5 10*3/uL — AB (ref 4.0–10.5)
WBC: 13.5 10*3/uL — ABNORMAL HIGH (ref 4.0–10.5)

## 2014-03-23 LAB — POCT I-STAT, CHEM 8
BUN: 13 mg/dL (ref 6–23)
BUN: 11 mg/dL (ref 6–23)
BUN: 12 mg/dL (ref 6–23)
BUN: 12 mg/dL (ref 6–23)
BUN: 11 mg/dL (ref 6–23)
BUN: 12 mg/dL (ref 6–23)
Calcium, Ion: 1.24 mmol/L (ref 1.13–1.30)
Calcium, Ion: 1.09 mmol/L — ABNORMAL LOW (ref 1.13–1.30)
Calcium, Ion: 1.22 mmol/L (ref 1.13–1.30)
Calcium, Ion: 1.11 mmol/L — ABNORMAL LOW (ref 1.13–1.30)
Calcium, Ion: 1.11 mmol/L — ABNORMAL LOW (ref 1.13–1.30)
Calcium, Ion: 1.16 mmol/L (ref 1.13–1.30)
Chloride: 102 mmol/L (ref 96–112)
Chloride: 102 mmol/L (ref 96–112)
Chloride: 97 mmol/L (ref 96–112)
Chloride: 100 mmol/L (ref 96–112)
Chloride: 100 mmol/L (ref 96–112)
Chloride: 106 mmol/L (ref 96–112)
Creatinine, Ser: 1.1 mg/dL (ref 0.50–1.35)
Creatinine, Ser: 0.9 mg/dL (ref 0.50–1.35)
Creatinine, Ser: 1.1 mg/dL (ref 0.50–1.35)
Creatinine, Ser: 1 mg/dL (ref 0.50–1.35)
Creatinine, Ser: 1 mg/dL (ref 0.50–1.35)
Creatinine, Ser: 1.1 mg/dL (ref 0.50–1.35)
Glucose, Bld: 99 mg/dL (ref 70–99)
Glucose, Bld: 102 mg/dL — ABNORMAL HIGH (ref 70–99)
Glucose, Bld: 97 mg/dL (ref 70–99)
Glucose, Bld: 116 mg/dL — ABNORMAL HIGH (ref 70–99)
Glucose, Bld: 151 mg/dL — ABNORMAL HIGH (ref 70–99)
Glucose, Bld: 150 mg/dL — ABNORMAL HIGH (ref 70–99)
HCT: 37 % — ABNORMAL LOW (ref 39.0–52.0)
HCT: 30 % — ABNORMAL LOW (ref 39.0–52.0)
HCT: 33 % — ABNORMAL LOW (ref 39.0–52.0)
HCT: 29 % — ABNORMAL LOW (ref 39.0–52.0)
HCT: 31 % — ABNORMAL LOW (ref 39.0–52.0)
HCT: 32 % — ABNORMAL LOW (ref 39.0–52.0)
Hemoglobin: 12.6 g/dL — ABNORMAL LOW (ref 13.0–17.0)
Hemoglobin: 10.2 g/dL — ABNORMAL LOW (ref 13.0–17.0)
Hemoglobin: 11.2 g/dL — ABNORMAL LOW (ref 13.0–17.0)
Hemoglobin: 9.9 g/dL — ABNORMAL LOW (ref 13.0–17.0)
Hemoglobin: 10.5 g/dL — ABNORMAL LOW (ref 13.0–17.0)
Hemoglobin: 10.9 g/dL — ABNORMAL LOW (ref 13.0–17.0)
Potassium: 4.2 mmol/L (ref 3.5–5.1)
Potassium: 3.8 mmol/L (ref 3.5–5.1)
Potassium: 4.3 mmol/L (ref 3.5–5.1)
Potassium: 4.7 mmol/L (ref 3.5–5.1)
Potassium: 4.3 mmol/L (ref 3.5–5.1)
Potassium: 4.3 mmol/L (ref 3.5–5.1)
Sodium: 140 mmol/L (ref 135–145)
Sodium: 137 mmol/L (ref 135–145)
Sodium: 139 mmol/L (ref 135–145)
Sodium: 137 mmol/L (ref 135–145)
Sodium: 138 mmol/L (ref 135–145)
Sodium: 138 mmol/L (ref 135–145)
TCO2: 22 mmol/L (ref 0–100)
TCO2: 21 mmol/L (ref 0–100)
TCO2: 22 mmol/L (ref 0–100)
TCO2: 22 mmol/L (ref 0–100)
TCO2: 20 mmol/L (ref 0–100)
TCO2: 19 mmol/L (ref 0–100)

## 2014-03-23 LAB — POCT I-STAT 3, ART BLOOD GAS (G3+)
Acid-base deficit: 3 mmol/L — ABNORMAL HIGH (ref 0.0–2.0)
Acid-base deficit: 3 mmol/L — ABNORMAL HIGH (ref 0.0–2.0)
Acid-base deficit: 4 mmol/L — ABNORMAL HIGH (ref 0.0–2.0)
Acid-base deficit: 5 mmol/L — ABNORMAL HIGH (ref 0.0–2.0)
Bicarbonate: 25.2 mEq/L — ABNORMAL HIGH (ref 20.0–24.0)
Bicarbonate: 22.9 mEq/L (ref 20.0–24.0)
Bicarbonate: 22 mEq/L (ref 20.0–24.0)
Bicarbonate: 21 mEq/L (ref 20.0–24.0)
Bicarbonate: 20.6 mEq/L (ref 20.0–24.0)
O2 Saturation: 100 %
O2 Saturation: 93 %
O2 Saturation: 99 %
O2 Saturation: 94 %
O2 Saturation: 92 %
Patient temperature: 36.1
Patient temperature: 36.6
Patient temperature: 37.4
TCO2: 26 mmol/L (ref 0–100)
TCO2: 24 mmol/L (ref 0–100)
TCO2: 23 mmol/L (ref 0–100)
TCO2: 22 mmol/L (ref 0–100)
TCO2: 22 mmol/L (ref 0–100)
pCO2 arterial: 40.4 mmHg (ref 35.0–45.0)
pCO2 arterial: 43.2 mmHg (ref 35.0–45.0)
pCO2 arterial: 38.3 mmHg (ref 35.0–45.0)
pCO2 arterial: 38.2 mmHg (ref 35.0–45.0)
pCO2 arterial: 39.1 mmHg (ref 35.0–45.0)
pH, Arterial: 7.403 (ref 7.350–7.450)
pH, Arterial: 7.332 — ABNORMAL LOW (ref 7.350–7.450)
pH, Arterial: 7.363 (ref 7.350–7.450)
pH, Arterial: 7.345 — ABNORMAL LOW (ref 7.350–7.450)
pH, Arterial: 7.331 — ABNORMAL LOW (ref 7.350–7.450)
pO2, Arterial: 333 mmHg — ABNORMAL HIGH (ref 80.0–100.0)
pO2, Arterial: 72 mmHg — ABNORMAL LOW (ref 80.0–100.0)
pO2, Arterial: 124 mmHg — ABNORMAL HIGH (ref 80.0–100.0)
pO2, Arterial: 74 mmHg — ABNORMAL LOW (ref 80.0–100.0)
pO2, Arterial: 68 mmHg — ABNORMAL LOW (ref 80.0–100.0)

## 2014-03-23 LAB — POCT I-STAT 4, (NA,K, GLUC, HGB,HCT)
Glucose, Bld: 114 mg/dL — ABNORMAL HIGH (ref 70–99)
HCT: 32 % — ABNORMAL LOW (ref 39.0–52.0)
Hemoglobin: 10.9 g/dL — ABNORMAL LOW (ref 13.0–17.0)
Potassium: 4.2 mmol/L (ref 3.5–5.1)
Sodium: 140 mmol/L (ref 135–145)

## 2014-03-23 LAB — GLUCOSE, CAPILLARY
Glucose-Capillary: 98 mg/dL (ref 70–99)
Glucose-Capillary: 114 mg/dL — ABNORMAL HIGH (ref 70–99)
Glucose-Capillary: 103 mg/dL — ABNORMAL HIGH (ref 70–99)
Glucose-Capillary: 86 mg/dL (ref 70–99)
Glucose-Capillary: 111 mg/dL — ABNORMAL HIGH (ref 70–99)
Glucose-Capillary: 118 mg/dL — ABNORMAL HIGH (ref 70–99)
Glucose-Capillary: 144 mg/dL — ABNORMAL HIGH (ref 70–99)
Glucose-Capillary: 150 mg/dL — ABNORMAL HIGH (ref 70–99)
Glucose-Capillary: 141 mg/dL — ABNORMAL HIGH (ref 70–99)
Glucose-Capillary: 117 mg/dL — ABNORMAL HIGH (ref 70–99)

## 2014-03-23 LAB — CREATININE, SERUM
Creatinine, Ser: 1.14 mg/dL (ref 0.50–1.35)
GFR calc Af Amer: 76 mL/min — ABNORMAL LOW (ref >90)
GFR calc non Af Amer: 65 mL/min — ABNORMAL LOW (ref >90)

## 2014-03-23 LAB — HEMOGLOBIN AND HEMATOCRIT, BLOOD
HCT: 27 % — ABNORMAL LOW (ref 39.0–52.0)
Hemoglobin: 9 g/dL — ABNORMAL LOW (ref 13.0–17.0)

## 2014-03-23 LAB — PROTIME-INR
INR: 1.37 (ref 0.00–1.49)
Prothrombin Time: 17.1 seconds — ABNORMAL HIGH (ref 11.6–15.2)

## 2014-03-23 LAB — PLATELET COUNT: Platelets: 133 10*3/uL — ABNORMAL LOW (ref 150–400)

## 2014-03-23 LAB — MAGNESIUM: Magnesium: 2.7 mg/dL — ABNORMAL HIGH (ref 1.5–2.5)

## 2014-03-23 LAB — APTT: APTT: 32 s (ref 24–37)

## 2014-03-23 SURGERY — CORONARY ARTERY BYPASS GRAFTING (CABG)
Anesthesia: General | Site: Chest

## 2014-03-23 MED ORDER — LEVALBUTEROL HCL 1.25 MG/0.5ML IN NEBU
1.2500 mg | INHALATION_SOLUTION | Freq: Four times a day (QID) | RESPIRATORY_TRACT | Status: DC
  Administered 2014-03-23 – 2014-03-24 (×4): 1.25 mg via RESPIRATORY_TRACT
  Filled 2014-03-23 (×8): qty 0.5

## 2014-03-23 MED ORDER — SODIUM CHLORIDE 0.9 % IV SOLN
INTRAVENOUS | Status: DC | PRN
Start: 2014-03-23 — End: 2014-03-23
  Administered 2014-03-23: 13:00:00 via INTRAVENOUS

## 2014-03-23 MED ORDER — FENTANYL CITRATE 0.05 MG/ML IJ SOLN
INTRAMUSCULAR | Status: AC
  Filled 2014-03-23: qty 5

## 2014-03-23 MED ORDER — PROTAMINE SULFATE 10 MG/ML IV SOLN
INTRAVENOUS | Status: AC
  Filled 2014-03-23: qty 10

## 2014-03-23 MED ORDER — SODIUM CHLORIDE 0.9 % IV SOLN
250.0000 mL | INTRAVENOUS | Status: DC
  Administered 2014-03-24: 250 mL via INTRAVENOUS

## 2014-03-23 MED ORDER — HEMOSTATIC AGENTS (NO CHARGE) OPTIME
TOPICAL | Status: DC | PRN
  Administered 2014-03-23 (×2): 1 via TOPICAL

## 2014-03-23 MED ORDER — CEFUROXIME SODIUM 1.5 G IJ SOLR
1.5000 g | Freq: Two times a day (BID) | INTRAMUSCULAR | Status: AC
  Administered 2014-03-23 – 2014-03-25 (×4): 1.5 g via INTRAVENOUS
  Filled 2014-03-23 (×4): qty 1.5

## 2014-03-23 MED ORDER — SUCCINYLCHOLINE CHLORIDE 20 MG/ML IJ SOLN
INTRAMUSCULAR | Status: AC
  Filled 2014-03-23: qty 1

## 2014-03-23 MED ORDER — ACETAMINOPHEN 500 MG PO TABS
1000.0000 mg | ORAL_TABLET | Freq: Four times a day (QID) | ORAL | Status: AC
  Administered 2014-03-24 – 2014-03-28 (×14): 1000 mg via ORAL
  Filled 2014-03-23 (×21): qty 2

## 2014-03-23 MED ORDER — METOCLOPRAMIDE HCL 5 MG/ML IJ SOLN
10.0000 mg | Freq: Four times a day (QID) | INTRAMUSCULAR | Status: DC
  Administered 2014-03-23 – 2014-03-24 (×3): 10 mg via INTRAVENOUS
  Filled 2014-03-23: qty 2

## 2014-03-23 MED ORDER — ASPIRIN 81 MG PO CHEW
324.0000 mg | CHEWABLE_TABLET | Freq: Every day | ORAL | Status: DC
  Administered 2014-03-25: 324 mg
  Filled 2014-03-23 (×2): qty 4

## 2014-03-23 MED ORDER — METOPROLOL TARTRATE 1 MG/ML IV SOLN
2.5000 mg | INTRAVENOUS | Status: DC | PRN
  Administered 2014-03-25: 5 mg via INTRAVENOUS
  Filled 2014-03-23: qty 5

## 2014-03-23 MED ORDER — SODIUM CHLORIDE 0.9 % IV SOLN
200.0000 ug | INTRAVENOUS | Status: DC | PRN
  Administered 2014-03-23: 0.2 ug/kg/h via INTRAVENOUS

## 2014-03-23 MED ORDER — BUDESONIDE-FORMOTEROL FUMARATE 160-4.5 MCG/ACT IN AERO
2.0000 | INHALATION_SPRAY | Freq: Two times a day (BID) | RESPIRATORY_TRACT | Status: DC
  Administered 2014-03-23 – 2014-03-28 (×11): 2 via RESPIRATORY_TRACT
  Filled 2014-03-23: qty 6

## 2014-03-23 MED ORDER — PROTAMINE SULFATE 10 MG/ML IV SOLN
INTRAVENOUS | Status: DC | PRN
  Administered 2014-03-23: 20 mg via INTRAVENOUS
  Administered 2014-03-23: 60 mg via INTRAVENOUS
  Administered 2014-03-23: 10 mg via INTRAVENOUS
  Administered 2014-03-23 (×2): 40 mg via INTRAVENOUS
  Administered 2014-03-23: 60 mg via INTRAVENOUS
  Administered 2014-03-23: 40 mg via INTRAVENOUS
  Administered 2014-03-23: 20 mg via INTRAVENOUS
  Administered 2014-03-23: 10 mg via INTRAVENOUS

## 2014-03-23 MED ORDER — VANCOMYCIN HCL IN DEXTROSE 1-5 GM/200ML-% IV SOLN
1000.0000 mg | Freq: Two times a day (BID) | INTRAVENOUS | Status: AC
  Administered 2014-03-23 – 2014-03-24 (×2): 1000 mg via INTRAVENOUS
  Filled 2014-03-23 (×2): qty 200

## 2014-03-23 MED ORDER — BISACODYL 5 MG PO TBEC
10.0000 mg | DELAYED_RELEASE_TABLET | Freq: Every day | ORAL | Status: DC
  Administered 2014-03-24 – 2014-03-29 (×6): 10 mg via ORAL
  Filled 2014-03-23 (×7): qty 2

## 2014-03-23 MED ORDER — ACETAMINOPHEN 160 MG/5ML PO SOLN: 650.0000 mg | Freq: Once | ORAL | Status: AC

## 2014-03-23 MED ORDER — ROCURONIUM BROMIDE 100 MG/10ML IV SOLN
INTRAVENOUS | Status: DC | PRN
  Administered 2014-03-23: 30 mg via INTRAVENOUS
  Administered 2014-03-23: 50 mg via INTRAVENOUS
  Administered 2014-03-23: 100 mg via INTRAVENOUS
  Administered 2014-03-23 (×2): 50 mg via INTRAVENOUS

## 2014-03-23 MED ORDER — MIDAZOLAM HCL 10 MG/2ML IJ SOLN
INTRAMUSCULAR | Status: AC
  Filled 2014-03-23: qty 2

## 2014-03-23 MED ORDER — PHENYLEPHRINE HCL 10 MG/ML IJ SOLN
0.0000 ug/min | INTRAVENOUS | Status: DC
  Administered 2014-03-23: 10 ug/min via INTRAVENOUS
  Filled 2014-03-23: qty 2

## 2014-03-23 MED ORDER — ROCURONIUM BROMIDE 50 MG/5ML IV SOLN
INTRAVENOUS | Status: AC
  Filled 2014-03-23: qty 1

## 2014-03-23 MED ORDER — NITROGLYCERIN IN D5W 200-5 MCG/ML-% IV SOLN
0.0000 ug/min | INTRAVENOUS | Status: DC
  Administered 2014-03-23: 10 ug/min via INTRAVENOUS
  Filled 2014-03-23: qty 250

## 2014-03-23 MED ORDER — METOPROLOL TARTRATE 12.5 MG HALF TABLET
12.5000 mg | ORAL_TABLET | Freq: Two times a day (BID) | ORAL | Status: DC
  Filled 2014-03-23 (×3): qty 1

## 2014-03-23 MED ORDER — LIDOCAINE HCL (CARDIAC) 20 MG/ML IV SOLN
INTRAVENOUS | Status: DC | PRN
  Administered 2014-03-23: 50 mg via INTRAVENOUS

## 2014-03-23 MED ORDER — MORPHINE SULFATE 2 MG/ML IJ SOLN
2.0000 mg | INTRAMUSCULAR | Status: DC | PRN
  Administered 2014-03-23 (×2): 2 mg via INTRAVENOUS
  Administered 2014-03-23: 4 mg via INTRAVENOUS
  Administered 2014-03-23: 2 mg via INTRAVENOUS
  Administered 2014-03-24 (×4): 4 mg via INTRAVENOUS
  Administered 2014-03-24: 2 mg via INTRAVENOUS
  Administered 2014-03-24: 4 mg via INTRAVENOUS
  Administered 2014-03-25: 2 mg via INTRAVENOUS
  Filled 2014-03-23: qty 1
  Filled 2014-03-23: qty 2
  Filled 2014-03-23: qty 1
  Filled 2014-03-23 (×3): qty 2
  Filled 2014-03-23: qty 1
  Filled 2014-03-23 (×3): qty 2

## 2014-03-23 MED ORDER — ROCURONIUM BROMIDE 50 MG/5ML IV SOLN
INTRAVENOUS | Status: AC
  Filled 2014-03-23: qty 2

## 2014-03-23 MED ORDER — LACTATED RINGERS IV SOLN: INTRAVENOUS | Status: DC

## 2014-03-23 MED ORDER — PANTOPRAZOLE SODIUM 40 MG PO TBEC: 40.0000 mg | DELAYED_RELEASE_TABLET | Freq: Every day | ORAL | Status: DC

## 2014-03-23 MED ORDER — MILRINONE IN DEXTROSE 20 MG/100ML IV SOLN
0.3000 ug/kg/min | INTRAVENOUS | Status: DC
  Administered 2014-03-23 – 2014-03-24 (×2): 0.3 ug/kg/min via INTRAVENOUS
  Filled 2014-03-23 (×2): qty 100

## 2014-03-23 MED ORDER — BISACODYL 10 MG RE SUPP: 10.0000 mg | Freq: Every day | RECTAL | Status: DC

## 2014-03-23 MED ORDER — SODIUM CHLORIDE 0.9 % IJ SOLN
INTRAMUSCULAR | Status: AC
  Filled 2014-03-23: qty 10

## 2014-03-23 MED ORDER — SODIUM CHLORIDE 0.9 % IJ SOLN
3.0000 mL | Freq: Two times a day (BID) | INTRAMUSCULAR | Status: DC
  Administered 2014-03-24 – 2014-03-29 (×9): 3 mL via INTRAVENOUS

## 2014-03-23 MED ORDER — PHENYLEPHRINE HCL 10 MG/ML IJ SOLN
10.0000 mg | INTRAVENOUS | Status: DC | PRN
  Administered 2014-03-23: 20 ug/min via INTRAVENOUS

## 2014-03-23 MED ORDER — EPHEDRINE SULFATE 50 MG/ML IJ SOLN
INTRAMUSCULAR | Status: AC
  Filled 2014-03-23: qty 1

## 2014-03-23 MED ORDER — ACETAMINOPHEN 650 MG RE SUPP
650.0000 mg | Freq: Once | RECTAL | Status: AC
  Administered 2014-03-23: 650 mg via RECTAL

## 2014-03-23 MED ORDER — MIDAZOLAM HCL 5 MG/5ML IJ SOLN
INTRAMUSCULAR | Status: DC | PRN
  Administered 2014-03-23: 2 mg via INTRAVENOUS
  Administered 2014-03-23: 3 mg via INTRAVENOUS
  Administered 2014-03-23: 2 mg via INTRAVENOUS
  Administered 2014-03-23: 1 mg via INTRAVENOUS
  Administered 2014-03-23: 3 mg via INTRAVENOUS
  Administered 2014-03-23: 1 mg via INTRAVENOUS
  Administered 2014-03-23 (×3): 2 mg via INTRAVENOUS

## 2014-03-23 MED ORDER — INSULIN REGULAR BOLUS VIA INFUSION
0.0000 [IU] | Freq: Three times a day (TID) | INTRAVENOUS | Status: DC
  Administered 2014-03-23: 1 [IU]/h via INTRAVENOUS
  Administered 2014-03-23: 1.2 [IU]/h via INTRAVENOUS
  Administered 2014-03-23: 0.5 [IU]/h via INTRAVENOUS
  Administered 2014-03-24: 3 [IU] via INTRAVENOUS
  Filled 2014-03-23: qty 10

## 2014-03-23 MED ORDER — SODIUM CHLORIDE 0.9 % IV SOLN
250.0000 [IU] | INTRAVENOUS | Status: DC | PRN
  Administered 2014-03-23: 1.2 [IU]/h via INTRAVENOUS

## 2014-03-23 MED ORDER — OXYCODONE HCL 5 MG PO TABS
5.0000 mg | ORAL_TABLET | ORAL | Status: DC | PRN
  Administered 2014-03-23 – 2014-03-25 (×8): 10 mg via ORAL
  Filled 2014-03-23 (×2): qty 2
  Filled 2014-03-23: qty 1
  Filled 2014-03-23 (×6): qty 2

## 2014-03-23 MED ORDER — SODIUM CHLORIDE 0.9 % IV SOLN
INTRAVENOUS | Status: DC
  Administered 2014-03-23: 22:00:00 via INTRAVENOUS
  Filled 2014-03-23 (×2): qty 2.5

## 2014-03-23 MED ORDER — LIDOCAINE HCL (CARDIAC) 20 MG/ML IV SOLN
INTRAVENOUS | Status: AC
  Filled 2014-03-23: qty 5

## 2014-03-23 MED ORDER — VANCOMYCIN HCL IN DEXTROSE 1-5 GM/200ML-% IV SOLN
1000.0000 mg | Freq: Once | INTRAVENOUS | Status: DC
  Filled 2014-03-23: qty 200

## 2014-03-23 MED ORDER — ONDANSETRON HCL 4 MG/2ML IJ SOLN
4.0000 mg | Freq: Four times a day (QID) | INTRAMUSCULAR | Status: DC | PRN
Start: 2014-03-23 — End: 2014-03-29
  Administered 2014-03-25 (×2): 4 mg via INTRAVENOUS
  Filled 2014-03-23 (×2): qty 2

## 2014-03-23 MED ORDER — DOCUSATE SODIUM 100 MG PO CAPS
200.0000 mg | ORAL_CAPSULE | Freq: Every day | ORAL | Status: DC
  Administered 2014-03-24 – 2014-03-29 (×6): 200 mg via ORAL
  Filled 2014-03-23 (×7): qty 2

## 2014-03-23 MED ORDER — PANTOPRAZOLE SODIUM 40 MG PO TBEC
40.0000 mg | DELAYED_RELEASE_TABLET | Freq: Every day | ORAL | Status: DC
  Administered 2014-03-25: 40 mg via ORAL
  Filled 2014-03-23: qty 1

## 2014-03-23 MED ORDER — DEXMEDETOMIDINE HCL IN NACL 200 MCG/50ML IV SOLN
0.0000 ug/kg/h | INTRAVENOUS | Status: DC
  Administered 2014-03-23: 0.7 ug/kg/h via INTRAVENOUS
  Filled 2014-03-23: qty 50

## 2014-03-23 MED ORDER — NITROGLYCERIN IN D5W 200-5 MCG/ML-% IV SOLN
INTRAVENOUS | Status: DC | PRN
  Administered 2014-03-23: 5 ug/min via INTRAVENOUS

## 2014-03-23 MED ORDER — MORPHINE SULFATE 2 MG/ML IJ SOLN
1.0000 mg | INTRAMUSCULAR | Status: DC | PRN
  Administered 2014-03-23 (×2): 2 mg via INTRAVENOUS
  Filled 2014-03-23: qty 2
  Filled 2014-03-23: qty 1

## 2014-03-23 MED ORDER — PROPOFOL 10 MG/ML IV BOLUS
INTRAVENOUS | Status: AC
  Filled 2014-03-23: qty 20

## 2014-03-23 MED ORDER — MIDAZOLAM HCL 2 MG/2ML IJ SOLN: 2.0000 mg | INTRAMUSCULAR | Status: DC | PRN

## 2014-03-23 MED ORDER — DOPAMINE-DEXTROSE 3.2-5 MG/ML-% IV SOLN: 3.0000 ug/kg/min | INTRAVENOUS | Status: DC

## 2014-03-23 MED ORDER — MAGNESIUM SULFATE 4 GM/100ML IV SOLN
4.0000 g | Freq: Once | INTRAVENOUS | Status: AC
  Administered 2014-03-23: 4 g via INTRAVENOUS
  Filled 2014-03-23: qty 100

## 2014-03-23 MED ORDER — LACTATED RINGERS IV SOLN
INTRAVENOUS | Status: DC | PRN
  Administered 2014-03-23 (×3): via INTRAVENOUS

## 2014-03-23 MED ORDER — NICOTINE 14 MG/24HR TD PT24
14.0000 mg | MEDICATED_PATCH | Freq: Every day | TRANSDERMAL | Status: DC
  Administered 2014-03-25 – 2014-03-29 (×5): 14 mg via TRANSDERMAL
  Filled 2014-03-23 (×7): qty 1

## 2014-03-23 MED ORDER — POTASSIUM CHLORIDE 10 MEQ/50ML IV SOLN: 10.0000 meq | INTRAVENOUS | Status: AC

## 2014-03-23 MED ORDER — SODIUM CHLORIDE 0.9 % IJ SOLN
OROMUCOSAL | Status: DC | PRN
  Administered 2014-03-23 (×3): 4 mL via TOPICAL

## 2014-03-23 MED ORDER — PROPOFOL 10 MG/ML IV BOLUS
INTRAVENOUS | Status: DC | PRN
  Administered 2014-03-23 (×2): 50 mg via INTRAVENOUS

## 2014-03-23 MED ORDER — ALBUMIN HUMAN 5 % IV SOLN
250.0000 mL | INTRAVENOUS | Status: AC | PRN
  Administered 2014-03-23 (×2): 250 mL via INTRAVENOUS

## 2014-03-23 MED ORDER — AMINOCAPROIC ACID 250 MG/ML IV SOLN
10.0000 g | INTRAVENOUS | Status: DC | PRN
  Administered 2014-03-23: 5 g/h via INTRAVENOUS

## 2014-03-23 MED ORDER — ASPIRIN EC 325 MG PO TBEC
325.0000 mg | DELAYED_RELEASE_TABLET | Freq: Every day | ORAL | Status: DC
  Administered 2014-03-24 – 2014-03-29 (×5): 325 mg via ORAL
  Filled 2014-03-23 (×6): qty 1

## 2014-03-23 MED ORDER — SODIUM CHLORIDE 0.9 % IJ SOLN
INTRAMUSCULAR | Status: AC
  Filled 2014-03-23: qty 20

## 2014-03-23 MED ORDER — ATORVASTATIN CALCIUM 80 MG PO TABS
80.0000 mg | ORAL_TABLET | Freq: Every day | ORAL | Status: DC
  Administered 2014-03-24 – 2014-03-29 (×6): 80 mg via ORAL
  Filled 2014-03-23 (×7): qty 1

## 2014-03-23 MED ORDER — FENTANYL CITRATE 0.05 MG/ML IJ SOLN
INTRAMUSCULAR | Status: AC
Start: 2014-03-23 — End: 2014-03-23
  Filled 2014-03-23: qty 5

## 2014-03-23 MED ORDER — MILRINONE IN DEXTROSE 20 MG/100ML IV SOLN
0.1250 ug/kg/min | Freq: Once | INTRAVENOUS | Status: AC
  Administered 2014-03-23: 0.125 ug/kg/min via INTRAVENOUS
  Filled 2014-03-23: qty 100

## 2014-03-23 MED ORDER — HEPARIN SODIUM (PORCINE) 1000 UNIT/ML IJ SOLN
INTRAMUSCULAR | Status: DC | PRN
  Administered 2014-03-23: 38000 [IU] via INTRAVENOUS
  Administered 2014-03-23: 4000 [IU] via INTRAVENOUS
  Administered 2014-03-23: 2000 [IU] via INTRAVENOUS

## 2014-03-23 MED ORDER — TRAZODONE HCL 150 MG PO TABS
150.0000 mg | ORAL_TABLET | Freq: Every evening | ORAL | Status: DC | PRN
  Filled 2014-03-23: qty 1

## 2014-03-23 MED ORDER — SODIUM CHLORIDE 0.45 % IV SOLN
INTRAVENOUS | Status: DC | PRN
  Administered 2014-03-23: 14:00:00 via INTRAVENOUS

## 2014-03-23 MED ORDER — SODIUM CHLORIDE 0.9 % IV SOLN
INTRAVENOUS | Status: DC
  Administered 2014-03-23 – 2014-03-27 (×2): via INTRAVENOUS

## 2014-03-23 MED ORDER — TRAMADOL HCL 50 MG PO TABS
50.0000 mg | ORAL_TABLET | ORAL | Status: DC | PRN
  Administered 2014-03-27: 50 mg via ORAL
  Filled 2014-03-23: qty 1

## 2014-03-23 MED ORDER — ACETAMINOPHEN 160 MG/5ML PO SOLN
1000.0000 mg | Freq: Four times a day (QID) | ORAL | Status: AC
  Filled 2014-03-23: qty 40

## 2014-03-23 MED ORDER — 0.9 % SODIUM CHLORIDE (POUR BTL) OPTIME
TOPICAL | Status: DC | PRN
  Administered 2014-03-23: 6000 mL

## 2014-03-23 MED ORDER — FENTANYL CITRATE 0.05 MG/ML IJ SOLN
INTRAMUSCULAR | Status: DC | PRN
  Administered 2014-03-23: 150 ug via INTRAVENOUS
  Administered 2014-03-23 (×2): 100 ug via INTRAVENOUS
  Administered 2014-03-23: 250 ug via INTRAVENOUS
  Administered 2014-03-23: 100 ug via INTRAVENOUS
  Administered 2014-03-23: 50 ug via INTRAVENOUS
  Administered 2014-03-23 (×2): 150 ug via INTRAVENOUS
  Administered 2014-03-23: 100 ug via INTRAVENOUS
  Administered 2014-03-23: 150 ug via INTRAVENOUS
  Administered 2014-03-23: 100 ug via INTRAVENOUS
  Administered 2014-03-23: 250 ug via INTRAVENOUS
  Administered 2014-03-23: 100 ug via INTRAVENOUS

## 2014-03-23 MED ORDER — PROTAMINE SULFATE 10 MG/ML IV SOLN
INTRAVENOUS | Status: AC
  Filled 2014-03-23: qty 25

## 2014-03-23 MED ORDER — LACTATED RINGERS IV SOLN: 500.0000 mL | Freq: Once | INTRAVENOUS | Status: DC | PRN

## 2014-03-23 MED ORDER — LACTATED RINGERS IV SOLN
INTRAVENOUS | Status: DC | PRN
  Administered 2014-03-23: 07:00:00 via INTRAVENOUS

## 2014-03-23 MED ORDER — METOPROLOL TARTRATE 25 MG/10 ML ORAL SUSPENSION
12.5000 mg | Freq: Two times a day (BID) | ORAL | Status: DC
  Filled 2014-03-23 (×3): qty 5

## 2014-03-23 MED ORDER — MILRINONE IN DEXTROSE 20 MG/100ML IV SOLN
INTRAVENOUS | Status: DC | PRN
  Administered 2014-03-23: 165 ug/kg/min via INTRAVENOUS

## 2014-03-23 MED ORDER — HEPARIN SODIUM (PORCINE) 1000 UNIT/ML IJ SOLN
INTRAMUSCULAR | Status: AC
  Filled 2014-03-23: qty 1

## 2014-03-23 MED ORDER — SODIUM CHLORIDE 0.9 % IJ SOLN: 3.0000 mL | INTRAMUSCULAR | Status: DC | PRN

## 2014-03-23 MED ORDER — LACTATED RINGERS IV SOLN
INTRAVENOUS | Status: DC | PRN
  Administered 2014-03-23: 06:00:00 via INTRAVENOUS

## 2014-03-23 MED ORDER — HYDROCORTISONE 1 % EX LOTN
1.0000 "application " | TOPICAL_LOTION | Freq: Three times a day (TID) | CUTANEOUS | Status: DC | PRN
  Filled 2014-03-23: qty 118

## 2014-03-23 MED ORDER — FAMOTIDINE IN NACL 20-0.9 MG/50ML-% IV SOLN
20.0000 mg | Freq: Two times a day (BID) | INTRAVENOUS | Status: AC
  Administered 2014-03-23: 20 mg via INTRAVENOUS

## 2014-03-23 MED FILL — Sodium Bicarbonate IV Soln 8.4%: INTRAVENOUS | Qty: 50 | Status: AC

## 2014-03-23 MED FILL — Sodium Chloride IV Soln 0.9%: INTRAVENOUS | Qty: 2000 | Status: AC

## 2014-03-23 MED FILL — Heparin Sodium (Porcine) Inj 1000 Unit/ML: INTRAMUSCULAR | Qty: 20 | Status: AC

## 2014-03-23 MED FILL — Mannitol IV Soln 20%: INTRAVENOUS | Qty: 500 | Status: AC

## 2014-03-23 MED FILL — Lidocaine HCl IV Inj 20 MG/ML: INTRAVENOUS | Qty: 5 | Status: AC

## 2014-03-23 MED FILL — Electrolyte-R (PH 7.4) Solution: INTRAVENOUS | Qty: 4000 | Status: AC

## 2014-03-23 SURGICAL SUPPLY — 104 items
ADAPTER CARDIO PERF ANTE/RETRO (ADAPTER) ×4 IMPLANT
BAG DECANTER FOR FLEXI CONT (MISCELLANEOUS) ×4 IMPLANT
BANDAGE ELASTIC 4 VELCRO ST LF (GAUZE/BANDAGES/DRESSINGS) ×8 IMPLANT
BANDAGE ELASTIC 6 VELCRO ST LF (GAUZE/BANDAGES/DRESSINGS) ×8 IMPLANT
BASKET HEART  (ORDER IN 25'S) (MISCELLANEOUS) ×1
BASKET HEART (ORDER IN 25'S) (MISCELLANEOUS) ×1
BASKET HEART (ORDER IN 25S) (MISCELLANEOUS) ×2 IMPLANT
BLADE STERNUM SYSTEM 6 (BLADE) ×4 IMPLANT
BLADE SURG 11 STRL SS (BLADE) ×4 IMPLANT
BLADE SURG 12 STRL SS (BLADE) ×4 IMPLANT
BLADE SURG ROTATE 9660 (MISCELLANEOUS) ×4 IMPLANT
BNDG GAUZE ELAST 4 BULKY (GAUZE/BANDAGES/DRESSINGS) ×8 IMPLANT
CANISTER SUCTION 2500CC (MISCELLANEOUS) ×4 IMPLANT
CANNULA ARTERIAL NVNT 3/8 22FR (MISCELLANEOUS) ×4 IMPLANT
CANNULA GUNDRY RCSP 15FR (MISCELLANEOUS) ×4 IMPLANT
CATH CPB KIT VANTRIGT (MISCELLANEOUS) ×4 IMPLANT
CATH ROBINSON RED A/P 18FR (CATHETERS) ×20 IMPLANT
CATH THORACIC 36FR RT ANG (CATHETERS) ×4 IMPLANT
CLIP FOGARTY SPRING 6M (CLIP) ×4 IMPLANT
CLIP TI WIDE RED SMALL 24 (CLIP) ×4 IMPLANT
COVER SURGICAL LIGHT HANDLE (MISCELLANEOUS) IMPLANT
CRADLE DONUT ADULT HEAD (MISCELLANEOUS) ×4 IMPLANT
DERMABOND ADVANCED (GAUZE/BANDAGES/DRESSINGS) ×2
DERMABOND ADVANCED .7 DNX12 (GAUZE/BANDAGES/DRESSINGS) ×2 IMPLANT
DRAIN CHANNEL 32F RND 10.7 FF (WOUND CARE) ×4 IMPLANT
DRAPE CARDIOVASCULAR INCISE (DRAPES) ×2
DRAPE SLUSH/WARMER DISC (DRAPES) ×4 IMPLANT
DRAPE SRG 135X102X78XABS (DRAPES) ×2 IMPLANT
DRSG AQUACEL AG ADV 3.5X14 (GAUZE/BANDAGES/DRESSINGS) ×4 IMPLANT
ELECT BLADE 4.0 EZ CLEAN MEGAD (MISCELLANEOUS) ×4
ELECT BLADE 6.5 EXT (BLADE) ×4 IMPLANT
ELECT CAUTERY BLADE 6.4 (BLADE) ×4 IMPLANT
ELECT REM PT RETURN 9FT ADLT (ELECTROSURGICAL) ×8
ELECTRODE BLDE 4.0 EZ CLN MEGD (MISCELLANEOUS) ×2 IMPLANT
ELECTRODE REM PT RTRN 9FT ADLT (ELECTROSURGICAL) ×4 IMPLANT
GAUZE SPONGE 4X4 12PLY STRL (GAUZE/BANDAGES/DRESSINGS) ×12 IMPLANT
GLOVE BIO SURGEON STRL SZ 6 (GLOVE) ×8 IMPLANT
GLOVE BIO SURGEON STRL SZ7 (GLOVE) ×8 IMPLANT
GLOVE BIO SURGEON STRL SZ7.5 (GLOVE) ×12 IMPLANT
GLOVE BIOGEL PI IND STRL 6 (GLOVE) ×4 IMPLANT
GLOVE BIOGEL PI IND STRL 7.0 (GLOVE) ×12 IMPLANT
GLOVE BIOGEL PI INDICATOR 6 (GLOVE) ×4
GLOVE BIOGEL PI INDICATOR 7.0 (GLOVE) ×12
GOWN STRL REUS W/ TWL LRG LVL3 (GOWN DISPOSABLE) ×20 IMPLANT
GOWN STRL REUS W/TWL LRG LVL3 (GOWN DISPOSABLE) ×20
HEMOSTAT POWDER SURGIFOAM 1G (HEMOSTASIS) ×12 IMPLANT
HEMOSTAT SURGICEL 2X14 (HEMOSTASIS) ×4 IMPLANT
INSERT FOGARTY XLG (MISCELLANEOUS) IMPLANT
KIT BASIN OR (CUSTOM PROCEDURE TRAY) ×4 IMPLANT
KIT ROOM TURNOVER OR (KITS) ×4 IMPLANT
KIT SUCTION CATH 14FR (SUCTIONS) ×4 IMPLANT
KIT VASOVIEW W/TROCAR VH 2000 (KITS) ×4 IMPLANT
LEAD PACING MYOCARDI (MISCELLANEOUS) ×4 IMPLANT
MARKER GRAFT CORONARY BYPASS (MISCELLANEOUS) ×16 IMPLANT
NS IRRIG 1000ML POUR BTL (IV SOLUTION) ×20 IMPLANT
PACK OPEN HEART (CUSTOM PROCEDURE TRAY) ×4 IMPLANT
PAD ARMBOARD 7.5X6 YLW CONV (MISCELLANEOUS) ×8 IMPLANT
PAD ELECT DEFIB RADIOL ZOLL (MISCELLANEOUS) ×4 IMPLANT
PENCIL BUTTON HOLSTER BLD 10FT (ELECTRODE) ×4 IMPLANT
PUNCH AORTIC ROTATE  4.5MM 8IN (MISCELLANEOUS) ×4 IMPLANT
PUNCH AORTIC ROTATE 4.0MM (MISCELLANEOUS) IMPLANT
PUNCH AORTIC ROTATE 4.5MM 8IN (MISCELLANEOUS) IMPLANT
PUNCH AORTIC ROTATE 5MM 8IN (MISCELLANEOUS) IMPLANT
SET CARDIOPLEGIA MPS 5001102 (MISCELLANEOUS) ×4 IMPLANT
SOLUTION ANTI FOG 6CC (MISCELLANEOUS) ×4 IMPLANT
SPONGE LAP 18X18 X RAY DECT (DISPOSABLE) ×4 IMPLANT
SPONGE LAP 4X18 X RAY DECT (DISPOSABLE) ×4 IMPLANT
SURGIFLO W/THROMBIN 8M KIT (HEMOSTASIS) ×4 IMPLANT
SUT BONE WAX W31G (SUTURE) ×4 IMPLANT
SUT MNCRL AB 4-0 PS2 18 (SUTURE) ×8 IMPLANT
SUT PROLENE 3 0 RB 1 (SUTURE) ×4 IMPLANT
SUT PROLENE 3 0 SH DA (SUTURE) IMPLANT
SUT PROLENE 3 0 SH1 36 (SUTURE) ×12 IMPLANT
SUT PROLENE 4 0 RB 1 (SUTURE) ×2
SUT PROLENE 4 0 SH DA (SUTURE) ×4 IMPLANT
SUT PROLENE 4-0 RB1 .5 CRCL 36 (SUTURE) ×2 IMPLANT
SUT PROLENE 5 0 C 1 36 (SUTURE) ×4 IMPLANT
SUT PROLENE 6 0 C 1 24 (SUTURE) ×4 IMPLANT
SUT PROLENE 6 0 C 1 30 (SUTURE) ×4 IMPLANT
SUT PROLENE 6 0 CC (SUTURE) ×16 IMPLANT
SUT PROLENE 8 0 BV175 6 (SUTURE) IMPLANT
SUT PROLENE BLUE 7 0 (SUTURE) ×8 IMPLANT
SUT SILK  1 MH (SUTURE)
SUT SILK 1 MH (SUTURE) IMPLANT
SUT SILK 2 0 SH CR/8 (SUTURE) ×4 IMPLANT
SUT SILK 3 0 SH CR/8 (SUTURE) ×4 IMPLANT
SUT STEEL 6MS V (SUTURE) ×8 IMPLANT
SUT STEEL SZ 6 DBL 3X14 BALL (SUTURE) ×4 IMPLANT
SUT VIC AB 1 CTX 18 (SUTURE) ×4 IMPLANT
SUT VIC AB 1 CTX 36 (SUTURE) ×4
SUT VIC AB 1 CTX36XBRD ANBCTR (SUTURE) ×4 IMPLANT
SUT VIC AB 2-0 CT1 27 (SUTURE) ×4
SUT VIC AB 2-0 CT1 TAPERPNT 27 (SUTURE) ×4 IMPLANT
SUT VIC AB 2-0 CTX 27 (SUTURE) IMPLANT
SUT VIC AB 3-0 X1 27 (SUTURE) IMPLANT
SUTURE E-PAK OPEN HEART (SUTURE) ×4 IMPLANT
SYSTEM SAHARA CHEST DRAIN ATS (WOUND CARE) ×4 IMPLANT
TAPE CLOTH SURG 4X10 WHT LF (GAUZE/BANDAGES/DRESSINGS) ×4 IMPLANT
TOWEL OR 17X24 6PK STRL BLUE (TOWEL DISPOSABLE) ×8 IMPLANT
TOWEL OR 17X26 10 PK STRL BLUE (TOWEL DISPOSABLE) ×8 IMPLANT
TRAY FOLEY IC TEMP SENS 16FR (CATHETERS) ×4 IMPLANT
TUBING INSUFFLATION (TUBING) ×4 IMPLANT
UNDERPAD 30X30 INCONTINENT (UNDERPADS AND DIAPERS) ×4 IMPLANT
WATER STERILE IRR 1000ML POUR (IV SOLUTION) ×8 IMPLANT

## 2014-03-23 NOTE — Transfer of Care (Signed)
Immediate Anesthesia Transfer of Care Note  Patient: Shane [name]  Procedure(s) Performed: Procedure(s): CORONARY ARTERY BYPASS GRAFTING (CABG), ON PUMP, TIMES FOUR, USING LEFT INTERNAL MAMMARY ARTERY, BILATERAL GREATER SAPHENOUS VEINS HARVESTED ENDOSCOPICALLY (N/A) TRANSESOPHAGEAL ECHOCARDIOGRAM (TEE) (N/A)  Patient Location: SICU  Anesthesia Type:General  Level of Consciousness: sedated, unresponsive and Patient remains intubated per anesthesia plan  Airway & Oxygen Therapy: Patient remains intubated per anesthesia plan and Patient placed on Ventilator (see vital sign flow sheet for setting)  Post-op Assessment: Report given to RN and Post -op Vital signs reviewed and stable  Post vital signs: Reviewed and stable  Last Vitals:  Filed Vitals:   03/23/14 0558  BP: 141/82  Pulse: 83  Temp: 66.4 C    Complications: No apparent anesthesia complications

## 2014-03-23 NOTE — Progress Notes (Signed)
TCTS BRIEF SICU PROGRESS NOTE  Day of Surgery  S/P Procedure(s) (LRB): CORONARY ARTERY BYPASS GRAFTING (CABG), ON PUMP, TIMES FOUR, USING LEFT INTERNAL MAMMARY ARTERY, BILATERAL GREATER SAPHENOUS VEINS HARVESTED ENDOSCOPICALLY (N/A) TRANSESOPHAGEAL ECHOCARDIOGRAM (TEE) (N/A)   Sleeping but easily arousable, extubated uneventfully and breathing comfortably NSR w/ stable hemodynamics Chest tube output low, small intermittent air leak noted UOP adequate Labs okay  Plan: Continue routine early postop  Shane [name] 03/23/2014 9:01 PM

## 2014-03-23 NOTE — Anesthesia Procedure Notes (Signed)
Procedure Name: Intubation Date/Time: 03/23/2014 7:59 AM Performed by: Carney Living Pre-anesthesia Checklist: Patient identified, Emergency Drugs available, Suction available, Patient being monitored and Timeout performed Patient Re-evaluated:Patient Re-evaluated prior to inductionOxygen Delivery Method: Circle system utilized Preoxygenation: Pre-oxygenation with 100% oxygen Intubation Type: IV induction Ventilation: Mask ventilation with difficulty and Oral airway inserted - appropriate to patient size Laryngoscope Size: Mac and 4 Grade View: Grade I Tube type: Oral Tube size: 8.0 mm Number of attempts: 1 Airway Equipment and Method: Stylet Placement Confirmation: ETT inserted through vocal cords under direct vision,  positive ETCO2 and breath sounds checked- equal and bilateral Secured at: 22 cm Tube secured with: Tape Dental Injury: Teeth and Oropharynx as per pre-operative assessment

## 2014-03-23 NOTE — Procedures (Signed)
Extubation Procedure Note  Patient Details:   Name: Shane [name] DOB: 07/31/1950 MRN: [id number]   Airway Documentation:     Evaluation  O2 sats: stable throughout Complications: No apparent complications Patient did tolerate procedure well. Bilateral Breath Sounds: Diminished Suctioning: Oral, Airway Yes   Pt extubated to 3L [place] per Rapid Heart Wean Protocol. NIF -30, VC 1.01. ABG within normal limits. Pt able to speak name and cough appropriately. Pt has increased secretions due to being a smoker. Pt stable throughout with no complications. RT will continue to monitor.   Jesse Sans 03/23/2014, 6:44 PM

## 2014-03-23 NOTE — Anesthesia Postprocedure Evaluation (Signed)
  Anesthesia Post-op Note  Patient: Shane [name]  Procedure(s) Performed: Procedure(s): CORONARY ARTERY BYPASS GRAFTING (CABG), ON PUMP, TIMES FOUR, USING LEFT INTERNAL MAMMARY ARTERY, BILATERAL GREATER SAPHENOUS VEINS HARVESTED ENDOSCOPICALLY (N/A) TRANSESOPHAGEAL ECHOCARDIOGRAM (TEE) (N/A)  Patient Location: PACU  Anesthesia Type: General   Level of Consciousness: awake, alert  and oriented  Airway and Oxygen Therapy: Patient Spontanous Breathing  Post-op Pain: mild  Post-op Assessment: Post-op Vital signs reviewed  Post-op Vital Signs: Reviewed  Last Vitals:  Filed Vitals:   03/23/14 1900  BP: 105/71  Pulse: 98  Temp: 37.1 C  Resp: 36    Complications: No apparent anesthesia complications

## 2014-03-23 NOTE — Progress Notes (Signed)
Initiated rapid heart wean protocol

## 2014-03-23 NOTE — Brief Op Note (Signed)
03/23/2014  11:26 AM  PATIENT:  [name]  64 y.o. male  PRE-OPERATIVE DIAGNOSIS:  CAD  POST-OPERATIVE DIAGNOSIS:  CAD  PROCEDURE:  Procedure(s):  CORONARY ARTERY BYPASS GRAFTING x 4 -LIMA to LAD -SVG to DIAGONAL -SVG to OM1 -SVG to RCA  ENDOSCOPIC SAPHENOUS VEIN HARVEST RIGHT AND LEFT THIGH  TRANSESOPHAGEAL ECHOCARDIOGRAM (TEE) (N/A)  SURGEON:  Surgeon(s) and Role:    * Ivin Poot, MD - Primary  PHYSICIAN ASSISTANT: Ellwood Handler PA-C  ANESTHESIA:   general  EBL:  Total I/O In: 2400 [I.V.:2400] Out: 400 [Urine:400]  BLOOD ADMINISTERED: CELLSAVER  DRAINS: Left Pleural Chest Tube, Mediastinal Chest Tubes   LOCAL MEDICATIONS USED:  NONE  SPECIMEN:  No Specimen  DISPOSITION OF SPECIMEN:  N/A  COUNTS:  NO   TOURNIQUET:  * No tourniquets in log *  DICTATION: .Dragon Dictation  PLAN OF CARE: Admit to inpatient   PATIENT DISPOSITION:  ICU - intubated and hemodynamically stable.   Delay start of Pharmacological VTE agent (>24hrs) due to surgical blood loss or risk of bleeding: yes

## 2014-03-23 NOTE — OR Nursing (Signed)
Second call to SICU at 1310

## 2014-03-23 NOTE — OR Nursing (Signed)
First call to SICU at 1228.

## 2014-03-23 NOTE — Progress Notes (Signed)
  Echocardiogram Echocardiogram Transesophageal has been performed.  Shane [name] M 03/23/2014, 8:47 AM

## 2014-03-23 NOTE — Progress Notes (Signed)
The patient was examined and preop studies reviewed. There has been no change from the prior exam and the patient is ready for surgery.   Plan CABG on Shane [name] today

## 2014-03-24 ENCOUNTER — Inpatient Hospital Stay (HOSPITAL_COMMUNITY): Payer: Commercial Managed Care - HMO

## 2014-03-24 ENCOUNTER — Encounter (HOSPITAL_COMMUNITY): Payer: Self-pay | Admitting: Cardiothoracic Surgery

## 2014-03-24 LAB — GLUCOSE, CAPILLARY
Glucose-Capillary: 118 mg/dL — ABNORMAL HIGH (ref 70–99)
Glucose-Capillary: 119 mg/dL — ABNORMAL HIGH (ref 70–99)
Glucose-Capillary: 107 mg/dL — ABNORMAL HIGH (ref 70–99)
Glucose-Capillary: 113 mg/dL — ABNORMAL HIGH (ref 70–99)
Glucose-Capillary: 115 mg/dL — ABNORMAL HIGH (ref 70–99)
Glucose-Capillary: 115 mg/dL — ABNORMAL HIGH (ref 70–99)
Glucose-Capillary: 119 mg/dL — ABNORMAL HIGH (ref 70–99)
Glucose-Capillary: 125 mg/dL — ABNORMAL HIGH (ref 70–99)
Glucose-Capillary: 137 mg/dL — ABNORMAL HIGH (ref 70–99)
Glucose-Capillary: 158 mg/dL — ABNORMAL HIGH (ref 70–99)

## 2014-03-24 LAB — BASIC METABOLIC PANEL
ANION GAP: 7 (ref 5–15)
BUN: 13 mg/dL (ref 6–23)
CALCIUM: 7.9 mg/dL — AB (ref 8.4–10.5)
CO2: 24 mmol/L (ref 19–32)
Chloride: 107 mmol/L (ref 96–112)
Creatinine, Ser: 1.38 mg/dL — ABNORMAL HIGH (ref 0.50–1.35)
GFR calc Af Amer: 60 mL/min — ABNORMAL LOW (ref >90)
GFR calc non Af Amer: 52 mL/min — ABNORMAL LOW (ref >90)
Glucose, Bld: 116 mg/dL — ABNORMAL HIGH (ref 70–99)
Potassium: 4.7 mmol/L (ref 3.5–5.1)
SODIUM: 138 mmol/L (ref 135–145)

## 2014-03-24 LAB — BLOOD GAS, ARTERIAL
Acid-base deficit: 2.5 mmol/L — ABNORMAL HIGH (ref 0.0–2.0)
Bicarbonate: 21.6 mEq/L (ref 20.0–24.0)
FIO2: 0.4 %
MECHVT: 700 mL
O2 Saturation: 96 %
PEEP: 5 cmH2O
Patient temperature: 98.6
RATE: 14 resp/min
TCO2: 22.7 mmol/L (ref 0–100)
pCO2 arterial: 36 mmHg (ref 35.0–45.0)
pH, Arterial: 7.395 (ref 7.350–7.450)
pO2, Arterial: 85.4 mmHg (ref 80.0–100.0)

## 2014-03-24 LAB — CBC
HCT: 30.5 % — ABNORMAL LOW (ref 39.0–52.0)
HEMATOCRIT: 31.2 % — AB (ref 39.0–52.0)
HEMOGLOBIN: 10.2 g/dL — AB (ref 13.0–17.0)
Hemoglobin: 9.8 g/dL — ABNORMAL LOW (ref 13.0–17.0)
MCH: 26.8 pg (ref 26.0–34.0)
MCH: 26.6 pg (ref 26.0–34.0)
MCHC: 32.7 g/dL (ref 30.0–36.0)
MCHC: 32.1 g/dL (ref 30.0–36.0)
MCV: 81.9 fL (ref 78.0–100.0)
MCV: 82.7 fL (ref 78.0–100.0)
PLATELETS: 154 10*3/uL (ref 150–400)
Platelets: 158 10*3/uL (ref 150–400)
RBC: 3.81 MIL/uL — ABNORMAL LOW (ref 4.22–5.81)
RBC: 3.69 MIL/uL — ABNORMAL LOW (ref 4.22–5.81)
RDW: 15.4 % (ref 11.5–15.5)
RDW: 15.7 % — ABNORMAL HIGH (ref 11.5–15.5)
WBC: 12.6 10*3/uL — AB (ref 4.0–10.5)
WBC: 15.5 10*3/uL — ABNORMAL HIGH (ref 4.0–10.5)

## 2014-03-24 LAB — POCT I-STAT, CHEM 8
BUN: 17 mg/dL (ref 6–23)
Calcium, Ion: 1.17 mmol/L (ref 1.13–1.30)
Chloride: 100 mmol/L (ref 96–112)
Creatinine, Ser: 1.8 mg/dL — ABNORMAL HIGH (ref 0.50–1.35)
Glucose, Bld: 133 mg/dL — ABNORMAL HIGH (ref 70–99)
HCT: 31 % — ABNORMAL LOW (ref 39.0–52.0)
Hemoglobin: 10.5 g/dL — ABNORMAL LOW (ref 13.0–17.0)
Potassium: 4.7 mmol/L (ref 3.5–5.1)
Sodium: 136 mmol/L (ref 135–145)
TCO2: 21 mmol/L (ref 0–100)

## 2014-03-24 LAB — CREATININE, SERUM
Creatinine, Ser: 2.01 mg/dL — ABNORMAL HIGH (ref 0.50–1.35)
GFR calc Af Amer: 38 mL/min — ABNORMAL LOW (ref >90)
GFR calc non Af Amer: 33 mL/min — ABNORMAL LOW (ref >90)

## 2014-03-24 LAB — MAGNESIUM
MAGNESIUM: 2.2 mg/dL (ref 1.5–2.5)
Magnesium: 2.4 mg/dL (ref 1.5–2.5)

## 2014-03-24 MED ORDER — INSULIN DETEMIR 100 UNIT/ML ~~LOC~~ SOLN
12.0000 [IU] | Freq: Two times a day (BID) | SUBCUTANEOUS | Status: DC
  Administered 2014-03-24 – 2014-03-28 (×8): 12 [IU] via SUBCUTANEOUS
  Filled 2014-03-24 (×13): qty 0.12

## 2014-03-24 MED ORDER — LEVALBUTEROL HCL 1.25 MG/0.5ML IN NEBU
1.2500 mg | INHALATION_SOLUTION | Freq: Three times a day (TID) | RESPIRATORY_TRACT | Status: DC
  Administered 2014-03-25 – 2014-03-26 (×4): 1.25 mg via RESPIRATORY_TRACT
  Filled 2014-03-24 (×11): qty 0.5

## 2014-03-24 MED ORDER — METOPROLOL TARTRATE 25 MG PO TABS
25.0000 mg | ORAL_TABLET | Freq: Two times a day (BID) | ORAL | Status: DC
  Administered 2014-03-24 – 2014-03-25 (×3): 25 mg via ORAL
  Filled 2014-03-24 (×4): qty 1

## 2014-03-24 MED ORDER — METOCLOPRAMIDE HCL 5 MG/ML IJ SOLN
10.0000 mg | Freq: Four times a day (QID) | INTRAMUSCULAR | Status: AC
  Administered 2014-03-24 – 2014-03-28 (×18): 10 mg via INTRAVENOUS
  Filled 2014-03-24 (×19): qty 2

## 2014-03-24 MED ORDER — FUROSEMIDE 10 MG/ML IJ SOLN
40.0000 mg | Freq: Every day | INTRAMUSCULAR | Status: DC
  Administered 2014-03-24 – 2014-03-27 (×4): 40 mg via INTRAVENOUS
  Filled 2014-03-24 (×6): qty 4

## 2014-03-24 MED ORDER — INSULIN ASPART 100 UNIT/ML ~~LOC~~ SOLN
0.0000 [IU] | SUBCUTANEOUS | Status: DC
  Administered 2014-03-24 – 2014-03-25 (×4): 2 [IU] via SUBCUTANEOUS

## 2014-03-24 MED ORDER — NITROGLYCERIN IN D5W 200-5 MCG/ML-% IV SOLN
0.0000 ug/min | INTRAVENOUS | Status: DC
  Filled 2014-03-24: qty 250

## 2014-03-24 MED ORDER — MIDAZOLAM HCL 2 MG/2ML IJ SOLN: 1.0000 mg | INTRAMUSCULAR | Status: DC | PRN

## 2014-03-24 MED ORDER — INSULIN ASPART 100 UNIT/ML ~~LOC~~ SOLN
4.0000 [IU] | Freq: Three times a day (TID) | SUBCUTANEOUS | Status: DC
  Administered 2014-03-24 – 2014-03-25 (×2): 4 [IU] via SUBCUTANEOUS

## 2014-03-24 NOTE — Anesthesia Postprocedure Evaluation (Signed)
Anesthesia Post Note  Patient: Shane [name]  Procedure(s) Performed: Procedure(s) (LRB): CORONARY ARTERY BYPASS GRAFTING (CABG), ON PUMP, TIMES FOUR, USING LEFT INTERNAL MAMMARY ARTERY, BILATERAL GREATER SAPHENOUS VEINS HARVESTED ENDOSCOPICALLY (N/A) TRANSESOPHAGEAL ECHOCARDIOGRAM (TEE) (N/A)  Anesthesia type: General  Patient location: SICU  Post pain: Pain level controlled  Post assessment: Post-op Vital signs reviewed  Last Vitals: BP 147/66 mmHg  Pulse 102  Temp(Src) 37 C (Oral)  Resp 19  Ht 5\' 11"  (1.803 m)  Wt 258 lb 1.6 oz (117.073 kg)  BMI 36.01 kg/m2  SpO2 100%  Post vital signs: Reviewed  Level of consciousness: sedated  Complications: No apparent anesthesia complications

## 2014-03-24 NOTE — Op Note (Signed)
NAMERAYON, MCCHRISTIAN NO.:  [id number]  MEDICAL RECORD NO.:  [id number]  LOCATION:  2S09C                        FACILITY:  [facility]  PHYSICIAN:  Ivin Poot, M.D.  DATE OF BIRTH:  12/22/1950  DATE OF PROCEDURE:  03/23/2014 DATE OF DISCHARGE:                              OPERATIVE REPORT   OPERATIONS: 1. Coronary artery bypass grafting x4 (left internal mammary artery to     left anterior descending artery, saphenous vein graft to diagonal,     saphenous vein graft to circumflex marginal, saphenous vein graft     to right ventricular marginal). 2. Endoscopic harvest of bilateral greater saphenous veins.  SURGEON:  Ivin Poot, M.D.  ASSISTANT:  Ellwood Handler, PA-C.  ANESTHESIA:  General.  PREOPERATIVE DIAGNOSES:  Severe multivessel coronary artery disease with accelerating-unstable angina, moderate right ventricular dysfunction.  POSTOPERATIVE DIAGNOSES:  Severe multivessel coronary artery disease with accelerating - unstable angina, moderate right ventricular dysfunction.  CLINICAL NOTE:  The patient is a 64 year old morbidly obese male with diabetes, hypertension and progressively worsening shortness of breath with exertion, so that he can no longer tolerate hardly any normal activities.  He denies any resting symptoms.  He has been a heavy smoker.  He underwent evaluation by Dr. [name], who recommended cardiac catheterization.  This subsequently demonstrated severe multivessel CAD with chronic occlusion of the RCA, high-grade 90% stenosis of the LAD - diagonal and 80% stenosis of the circumflex marginal.  Ejection fraction was fairly well preserved; however, his RV function on echo was reduced and the RV chamber was dilated.  This may be secondary to his chronic lung disease and sleep apnea.  Prior to surgery, I examined the patient in the office and reviewed the results of this cardiac catheterization with the patient and his wife. I  discussed the indications and expected benefits of multivessel coronary artery bypass surgery for treatment of his severe three-vessel coronary artery disease.  I discussed the major details of surgery including the use of general anesthesia and cardiopulmonary bypass, the location of the surgical incisions, and expected postoperative recovery. I discussed with the patient and wife the potential risks to him of the operation including the risks of stroke, MI, bleeding, blood transfusion requirement, infection, postop pulmonary problems including prolonged ventilator dependence due to his heavy smoking history and postoperative pleural effusions.  He also understood there was a less than 5% chance of death.  After reviewing these issues, he demonstrated his understanding and agreed to proceed with surgery under what I felt was an informed consent.  OPERATIVE FINDINGS: 1. Transesophageal echo shows moderate-severe RV dilatation and     reduced function, but no significant TR. 2. Adequate targets, but diffusely diseased vessels.  The right     coronary distally was too small and atretic to graft. 3. Adequate conduit. 4. No blood products required intraoperatively. 5. Echocardiogram showed improved RV function following separation     from cardiopulmonary bypass.  OPERATIVE PROCEDURE:  The patient was brought to the operating room and placed supine on the operating table where general anesthesia was induced under invasive hemodynamic monitoring.  A transesophageal echoprobe was placed by the anesthesiologist.  The patient was prepped and draped as a sterile field.  A proper time-out was performed.  A sternal incision was made as the saphenous vein was harvested from both thighs.  The vein below the knee was too small to use bilaterally.  All usable saphenous vein was harvested for the surgery.  The sternal retractor was placed using the deep blades due to the patient's obese body  habitus.  The pericardium was opened and suspended. Exposure to the heart was difficult.  The ascending aorta was foreshortened.  The right atrium was posterior.  Pursestrings were placed in the ascending aorta and right atrium, and after heparin was administered and the ACT was documented as being therapeutic, the patient was cannulated and placed on cardiopulmonary bypass.  The coronary arteries were identified for grafting and the mammary artery and vein grafts were prepared for the distal anastomoses.  Cardioplegia cannulas were placed for both antegrade and retrograde cold blood cardioplegia, and the patient was cooled to 32 degrees.  The aortic crossclamp was applied and one liter of cold blood cardioplegia was delivered in split doses between the antegrade aortic and retrograde coronary sinus catheters.  There was good cardioplegic arrest and the septal temperature dropped less than 14 degrees.  Cardioplegia was delivered every 20 minutes or less while the crossclamp was in place.  The distal coronary anastomoses were performed.  The first distal anastomosis was to the RV marginal branch of right coronary.  This was a 1.5-mm vessel.  The distal posterior descending was too small to graft and was atretic.  A reverse saphenous vein was sewn end-to-side with running 7-0 Prolene and there was good flow through the graft. Cardioplegia was redosed.  The second distal anastomosis was a circumflex marginal.  This was a diffusely diseased 1.5-mm vessel, proximal 80% stenosis.  A reverse saphenous vein was sewn end-to-side with running 7-0 Prolene with good flow through the graft.  Cardioplegia was redosed.  The third distal anastomosis was to the diagonal branch to LAD.  This was a 1.7-mm vessel with proximal 99% stenosis.  A reverse saphenous vein was sewn end-to-side with running 7-0 Prolene with good flow through the graft.  Cardioplegia was redosed.  The fourth distal anastomosis  was to the distal third of the LAD.  This had a proximal 80%-90% stenosis.  The left IMA pedicle was brought through an opening and the left lateral pericardium was brought down onto the LAD and sewn end-to-side with running 8-0 Prolene.  There was good flow through the anastomosis after briefly releasing the bulldog on the mammary pedicle.  The pedicle was secured to the epicardium with 6-0 Prolenes.  Cardioplegia was redosed.  While the crossclamp was in place, three proximal vein anastomoses were performed on the ascending aorta using a 4.5-mm punch with running 6-0 Prolene.  Prior to tying down the final proximal anastomosis, air was vented from the coronaries with a dose of retrograde warm blood cardioplegia.  The crossclamp was then removed.  The heart resumed a spontaneous rhythm.  The vein graft was de-aired and opened and each had good flow, and hemostasis was documented at the proximal and distal anastomoses.  The patient was rewarmed and reperfused.  Temporary pacing wires were applied.  The lungs were expanded.  The ventilator was resumed.  The patient was then weaned from cardiopulmonary bypass.  He was placed on low-dose milrinone because of his RV dysfunction.  He weaned off cardiopulmonary bypass without difficulty.  Hemodynamics were stable.  Echo  showed improved cardiac function.  Protamine was administered without adverse reaction. Cannulas were removed.  The mediastinum was irrigated with warm saline. The superior pericardial fat was closed over the aorta and vein grafts. The anterior mediastinal and left pleural chest tube were placed and brought out through separate incisions.  The sternum was closed with interrupted steel wire.  The pectoralis fascia was closed with a running #1 Vicryl superiorly and with interrupted #1 Vicryl sutures inferiorly. The subcutaneous and skin layers were closed in running Vicryl.  Total cardiopulmonary bypass time was 120  minutes.     Ivin Poot, M.D.     PV/MEDQ  D:  03/23/2014  T:  03/24/2014  Job:  [id number]  cc:   Juanda Bond. Burt Knack, MD

## 2014-03-24 NOTE — Progress Notes (Signed)
1 Day Post-Op Procedure(s) (LRB): CORONARY ARTERY BYPASS GRAFTING (CABG), ON PUMP, TIMES FOUR, USING LEFT INTERNAL MAMMARY ARTERY, BILATERAL GREATER SAPHENOUS VEINS HARVESTED ENDOSCOPICALLY (N/A) TRANSESOPHAGEAL ECHOCARDIOGRAM (TEE) (N/A) Subjective: Extubated, nsr,stable hemodynamics postop CABG COPD,DM smoking hx lepreop Postop air leak from L CT  Objective: Vital signs in last 24 hours: Temp:  [96.4 F (35.8 C)-99.9 F (37.7 C)] 99.5 F (37.5 C) (03/15 0715) Pulse Rate:  [67-115] 110 (03/15 0715) Cardiac Rhythm:  [-] Sinus tachycardia (03/15 0700) Resp:  [0-36] 25 (03/15 0715) BP: (88-144)/(48-83) 115/61 mmHg (03/15 0715) SpO2:  [90 %-100 %] 95 % (03/15 0715) Arterial Line BP: (86-199)/(44-71) 125/50 mmHg (03/15 0715) FiO2 (%):  [40 %-100 %] 40 % (03/14 1800) Weight:  [244 lb 7.8 oz (110.9 kg)-258 lb 1.6 oz (117.073 kg)] 258 lb 1.6 oz (117.073 kg) (03/15 0500)  Hemodynamic parameters for last 24 hours: PAP: (18-45)/(1-25) 28/8 mmHg CO:  [4.9 L/min-8 L/min] 7 L/min CI:  [2.2 L/min/m2-3.5 L/min/m2] 3.1 L/min/m2  Intake/Output from previous day: 03/14 0701 - 03/15 0700 In: 6641.7 [I.V.:4934.7; Blood:577; IV [id number]:4765] Out: 4025 [Urine:2295; Blood:1100; Chest Tube:630] Intake/Output this shift:    Alert and comfortable Neuro intact Lab Results:  Recent Labs  03/23/14 1949 03/23/14 1957 03/24/14 0310  WBC 13.5*  --  12.6*  HGB 10.7* 10.9* 10.2*  HCT 33.3* 32.0* 31.2*  PLT 154  --  158   BMET:  Recent Labs  03/23/14 1957 03/24/14 0310  NA 138 138  K 4.3 4.7  CL 106 107  CO2  --  24  GLUCOSE 150* 116*  BUN 12 13  CREATININE 1.10 1.38*  CALCIUM  --  7.9*    PT/INR:  Recent Labs  03/23/14 1440  LABPROT 17.1*  INR 1.37   ABG    Component Value Date/Time   PHART 7.395 03/24/2014 0550   HCO3 21.6 03/24/2014 0550   TCO2 22.7 03/24/2014 0550   ACIDBASEDEF 2.5* 03/24/2014 0550   O2SAT 96.0 03/24/2014 0550   CBG (last 3)   Recent Labs   03/24/14 0522 03/24/14 0605 03/24/14 0648  GLUCAP 115* 125* 119*    Assessment/Plan: S/P Procedure(s) (LRB): CORONARY ARTERY BYPASS GRAFTING (CABG), ON PUMP, TIMES FOUR, USING LEFT INTERNAL MAMMARY ARTERY, BILATERAL GREATER SAPHENOUS VEINS HARVESTED ENDOSCOPICALLY (N/A) TRANSESOPHAGEAL ECHOCARDIOGRAM (TEE) (N/A) See progression orders Diuresis Leave L PT for air leak    LOS: 1 day    Shane [name] 03/24/2014

## 2014-03-24 NOTE — Progress Notes (Signed)
UR Completed.  [phone]  

## 2014-03-24 NOTE — Progress Notes (Signed)
Patient ID: Shane [name], male   DOB: [birth year], 64 y.o.   MRN: [id number]  SICU Evening Rounds:  Hemodynamically stable  Urine output 50-60/hr.  BMET    Component Value Date/Time   NA 136 03/24/2014 1714   K 4.7 03/24/2014 1714   CL 100 03/24/2014 1714   CO2 24 03/24/2014 0310   GLUCOSE 133* 03/24/2014 1714   BUN 17 03/24/2014 1714   CREATININE 1.80* 03/24/2014 1714   CALCIUM 7.9* 03/24/2014 0310   CALCIUM 9.6 09/24/2006 2258   GFRNONAA 33* 03/24/2014 1700   GFRAA 38* 03/24/2014 1700    Creat up this pm. Repeat in the am.

## 2014-03-25 ENCOUNTER — Inpatient Hospital Stay (HOSPITAL_COMMUNITY): Payer: Commercial Managed Care - HMO

## 2014-03-25 DIAGNOSIS — E119 Type 2 diabetes mellitus without complications: Secondary | ICD-10-CM

## 2014-03-25 LAB — GLUCOSE, CAPILLARY
Glucose-Capillary: 101 mg/dL — ABNORMAL HIGH (ref 70–99)
Glucose-Capillary: 103 mg/dL — ABNORMAL HIGH (ref 70–99)
Glucose-Capillary: 116 mg/dL — ABNORMAL HIGH (ref 70–99)
Glucose-Capillary: 122 mg/dL — ABNORMAL HIGH (ref 70–99)
Glucose-Capillary: 125 mg/dL — ABNORMAL HIGH (ref 70–99)
Glucose-Capillary: 126 mg/dL — ABNORMAL HIGH (ref 70–99)
Glucose-Capillary: 126 mg/dL — ABNORMAL HIGH (ref 70–99)
Glucose-Capillary: 127 mg/dL — ABNORMAL HIGH (ref 70–99)
Glucose-Capillary: 135 mg/dL — ABNORMAL HIGH (ref 70–99)
Glucose-Capillary: 136 mg/dL — ABNORMAL HIGH (ref 70–99)
Glucose-Capillary: 138 mg/dL — ABNORMAL HIGH (ref 70–99)
Glucose-Capillary: 143 mg/dL — ABNORMAL HIGH (ref 70–99)
Glucose-Capillary: 96 mg/dL (ref 70–99)

## 2014-03-25 LAB — BASIC METABOLIC PANEL
Anion gap: 7 (ref 5–15)
BUN: 18 mg/dL (ref 6–23)
CO2: 25 mmol/L (ref 19–32)
Calcium: 8.3 mg/dL — ABNORMAL LOW (ref 8.4–10.5)
Chloride: 101 mmol/L (ref 96–112)
Creatinine, Ser: 1.64 mg/dL — ABNORMAL HIGH (ref 0.50–1.35)
GFR calc Af Amer: 49 mL/min — ABNORMAL LOW (ref >90)
GFR calc non Af Amer: 42 mL/min — ABNORMAL LOW (ref >90)
Glucose, Bld: 172 mg/dL — ABNORMAL HIGH (ref 70–99)
Potassium: 4.7 mmol/L (ref 3.5–5.1)
Sodium: 133 mmol/L — ABNORMAL LOW (ref 135–145)

## 2014-03-25 LAB — CBC
HCT: 27.8 % — ABNORMAL LOW (ref 39.0–52.0)
Hemoglobin: 9 g/dL — ABNORMAL LOW (ref 13.0–17.0)
MCH: 26.9 pg (ref 26.0–34.0)
MCHC: 32.4 g/dL (ref 30.0–36.0)
MCV: 83.2 fL (ref 78.0–100.0)
Platelets: 142 10*3/uL — ABNORMAL LOW (ref 150–400)
RBC: 3.34 MIL/uL — ABNORMAL LOW (ref 4.22–5.81)
RDW: 15.5 % (ref 11.5–15.5)
WBC: 16.7 10*3/uL — ABNORMAL HIGH (ref 4.0–10.5)

## 2014-03-25 MED ORDER — HYDRALAZINE HCL 10 MG PO TABS
10.0000 mg | ORAL_TABLET | Freq: Three times a day (TID) | ORAL | Status: DC
  Administered 2014-03-25 – 2014-03-29 (×13): 10 mg via ORAL
  Filled 2014-03-25 (×16): qty 1

## 2014-03-25 MED ORDER — SODIUM CHLORIDE 0.9 % IJ SOLN
3.0000 mL | Freq: Two times a day (BID) | INTRAMUSCULAR | Status: DC
  Administered 2014-03-25 – 2014-03-29 (×7): 3 mL via INTRAVENOUS

## 2014-03-25 MED ORDER — MOVING RIGHT ALONG BOOK
Freq: Once | Status: AC
  Administered 2014-03-25: 16:00:00
  Filled 2014-03-25: qty 1

## 2014-03-25 MED ORDER — LACTULOSE 10 GM/15ML PO SOLN
30.0000 g | Freq: Every day | ORAL | Status: AC
  Administered 2014-03-25 – 2014-03-26 (×2): 30 g via ORAL
  Filled 2014-03-25 (×2): qty 45

## 2014-03-25 MED ORDER — BISACODYL 10 MG RE SUPP
10.0000 mg | Freq: Once | RECTAL | Status: AC
  Administered 2014-03-25: 10 mg via RECTAL
  Filled 2014-03-25: qty 1

## 2014-03-25 MED ORDER — SODIUM CHLORIDE 0.9 % IV SOLN: 250.0000 mL | INTRAVENOUS | Status: DC | PRN

## 2014-03-25 MED ORDER — METOPROLOL TARTRATE 50 MG PO TABS
50.0000 mg | ORAL_TABLET | Freq: Two times a day (BID) | ORAL | Status: DC
  Administered 2014-03-25 – 2014-03-29 (×9): 50 mg via ORAL
  Filled 2014-03-25 (×10): qty 1

## 2014-03-25 MED ORDER — PROMETHAZINE HCL 25 MG/ML IJ SOLN
12.5000 mg | INTRAMUSCULAR | Status: DC | PRN
  Administered 2014-03-25: 12.5 mg via INTRAVENOUS
  Filled 2014-03-25: qty 1

## 2014-03-25 MED ORDER — OMEPRAZOLE 20 MG PO CPDR
20.0000 mg | DELAYED_RELEASE_CAPSULE | Freq: Every day | ORAL | Status: DC
  Administered 2014-03-25 – 2014-03-29 (×5): 20 mg via ORAL
  Filled 2014-03-25 (×5): qty 1

## 2014-03-25 MED ORDER — SODIUM CHLORIDE 0.9 % IJ SOLN
3.0000 mL | INTRAMUSCULAR | Status: DC | PRN
Start: 2014-03-25 — End: 2014-03-29

## 2014-03-25 MED ORDER — MAGNESIUM HYDROXIDE 400 MG/5ML PO SUSP
30.0000 mL | Freq: Every day | ORAL | Status: DC | PRN
  Administered 2014-03-26: 30 mL via ORAL
  Filled 2014-03-25 (×2): qty 30

## 2014-03-25 NOTE — Progress Notes (Signed)
Post CT removal

## 2014-03-25 NOTE — Progress Notes (Signed)
2 Days Post-Op Procedure(s) (LRB): CORONARY ARTERY BYPASS GRAFTING (CABG), ON PUMP, TIMES FOUR, USING LEFT INTERNAL MAMMARY ARTERY, BILATERAL GREATER SAPHENOUS VEINS HARVESTED ENDOSCOPICALLY (N/A) TRANSESOPHAGEAL ECHOCARDIOGRAM (TEE) (N/A) Subjective: BP high last pm - remains on NTG Air leak frim CT resolved NSR Will tx to stepdown today Creat slight improvement Objective: Vital signs in last 24 hours: Temp:  [98.3 F (36.8 C)-100.1 F (37.8 C)] 98.3 F (36.8 C) (03/16 0743) Pulse Rate:  [37-125] 102 (03/16 0800) Cardiac Rhythm:  [-] Sinus tachycardia (03/16 0900) Resp:  [16-36] 19 (03/16 0800) BP: (102-175)/(49-91) 132/75 mmHg (03/16 0800) SpO2:  [86 %-100 %] 100 % (03/16 0800) Weight:  [257 lb 11.5 oz (116.9 kg)] 257 lb 11.5 oz (116.9 kg) (03/16 0500)  Hemodynamic parameters for last 24 hours:  sinus tach  Intake/Output from previous day: 03/15 0701 - 03/16 0700 In: 719.5 [I.V.:669.5; IV Piggyback:50] Out: 1360 [Urine:1015; Chest Tube:345] Intake/Output this shift: Total I/O In: 38 [I.V.:38] Out: 155 [Urine:105; Chest Tube:50]  abd distended Scattered rhonchi Neuro intact Lab Results:  Recent Labs  03/24/14 1700 03/24/14 1714 03/25/14 0325  WBC 15.5*  --  16.7*  HGB 9.8* 10.5* 9.0*  HCT 30.5* 31.0* 27.8*  PLT 154  --  142*   BMET:  Recent Labs  03/24/14 0310  03/24/14 1714 03/25/14 0325  NA 138  --  136 133*  K 4.7  --  4.7 4.7  CL 107  --  100 101  CO2 24  --   --  25  GLUCOSE 116*  --  133* 172*  BUN 13  --  17 18  CREATININE 1.38*  < > 1.80* 1.64*  CALCIUM 7.9*  --   --  8.3*  < > = values in this interval not displayed.  PT/INR:  Recent Labs  03/23/14 1440  LABPROT 17.1*  INR 1.37   ABG    Component Value Date/Time   PHART 7.395 03/24/2014 0550   HCO3 21.6 03/24/2014 0550   TCO2 21 03/24/2014 1714   ACIDBASEDEF 2.5* 03/24/2014 0550   O2SAT 96.0 03/24/2014 0550   CBG (last 3)   Recent Labs  03/24/14 2344 03/25/14 0341  03/25/14 0742  GLUCAP 116* 101* 126*    Assessment/Plan: S/P Procedure(s) (LRB): CORONARY ARTERY BYPASS GRAFTING (CABG), ON PUMP, TIMES FOUR, USING LEFT INTERNAL MAMMARY ARTERY, BILATERAL GREATER SAPHENOUS VEINS HARVESTED ENDOSCOPICALLY (N/A) TRANSESOPHAGEAL ECHOCARDIOGRAM (TEE) (N/A) Mobilize Diuresis Diabetes control Plan for transfer to step-down: see transfer orders Increase metoprolol for Increased HR, BP- add po apresoline Follow creat- resume metformin before DC home  LOS: 2 days    Tharon Aquas Trigt III 03/25/2014

## 2014-03-25 NOTE — Progress Notes (Signed)
Attempted to call report to [facility] RN for [phone] but currently unable to receive report. Gave spectra link number for return call.

## 2014-03-25 NOTE — Progress Notes (Signed)
Patient had some complaints of nausea, heart burn and no bowel movement; patient is not passing gas; patient would like to have Prilosec restarted; called MD; MD ordered restart of Prilosec, lactulose, patient is to be NPO excepts for sips with meds and ice chips; will continue to monitor.  Rowe Pavy, RN

## 2014-03-25 NOTE — Progress Notes (Signed)
Patient is throwing up dark brown liquid, notified MD, MD ordered 12.5 phenergan Q4 PRN, MD will order a abdominal xray in the am with his chest xray; will continue to monitor.  Rowe Pavy, RN

## 2014-03-25 NOTE — Progress Notes (Signed)
Pre CT removal

## 2014-03-26 ENCOUNTER — Inpatient Hospital Stay (HOSPITAL_COMMUNITY): Payer: Commercial Managed Care - HMO

## 2014-03-26 ENCOUNTER — Encounter (HOSPITAL_COMMUNITY): Payer: Self-pay | Admitting: General Practice

## 2014-03-26 LAB — CBC
HCT: 28 % — ABNORMAL LOW (ref 39.0–52.0)
Hemoglobin: 8.8 g/dL — ABNORMAL LOW (ref 13.0–17.0)
MCH: 26 pg (ref 26.0–34.0)
MCHC: 31.4 g/dL (ref 30.0–36.0)
MCV: 82.6 fL (ref 78.0–100.0)
Platelets: 155 10*3/uL (ref 150–400)
RBC: 3.39 MIL/uL — ABNORMAL LOW (ref 4.22–5.81)
RDW: 15.6 % — ABNORMAL HIGH (ref 11.5–15.5)
WBC: 12.7 10*3/uL — ABNORMAL HIGH (ref 4.0–10.5)

## 2014-03-26 LAB — BASIC METABOLIC PANEL
Anion gap: 8 (ref 5–15)
BUN: 25 mg/dL — ABNORMAL HIGH (ref 6–23)
CO2: 27 mmol/L (ref 19–32)
Calcium: 8.5 mg/dL (ref 8.4–10.5)
Chloride: 102 mmol/L (ref 96–112)
Creatinine, Ser: 1.44 mg/dL — ABNORMAL HIGH (ref 0.50–1.35)
GFR calc Af Amer: 57 mL/min — ABNORMAL LOW (ref >90)
GFR calc non Af Amer: 49 mL/min — ABNORMAL LOW (ref >90)
Glucose, Bld: 107 mg/dL — ABNORMAL HIGH (ref 70–99)
Potassium: 4.2 mmol/L (ref 3.5–5.1)
Sodium: 137 mmol/L (ref 135–145)

## 2014-03-26 LAB — GLUCOSE, CAPILLARY
Glucose-Capillary: 101 mg/dL — ABNORMAL HIGH (ref 70–99)
Glucose-Capillary: 89 mg/dL (ref 70–99)
Glucose-Capillary: 104 mg/dL — ABNORMAL HIGH (ref 70–99)
Glucose-Capillary: 100 mg/dL — ABNORMAL HIGH (ref 70–99)

## 2014-03-26 MED FILL — Potassium Chloride Inj 2 mEq/ML: INTRAVENOUS | Qty: 40 | Status: AC

## 2014-03-26 MED FILL — Magnesium Sulfate Inj 50%: INTRAMUSCULAR | Qty: 10 | Status: AC

## 2014-03-26 MED FILL — Dexmedetomidine HCl in NaCl 0.9% IV Soln 400 MCG/100ML: INTRAVENOUS | Qty: 100 | Status: AC

## 2014-03-26 MED FILL — Heparin Sodium (Porcine) Inj 1000 Unit/ML: INTRAMUSCULAR | Qty: 30 | Status: AC

## 2014-03-26 NOTE — Progress Notes (Addendum)
      [address]       [place] [address]             [phone]        3 Days Post-Op Procedure(s) (LRB): CORONARY ARTERY BYPASS GRAFTING (CABG), ON PUMP, TIMES FOUR, USING LEFT INTERNAL MAMMARY ARTERY, BILATERAL GREATER SAPHENOUS VEINS HARVESTED ENDOSCOPICALLY (N/A) TRANSESOPHAGEAL ECHOCARDIOGRAM (TEE) (N/A)  Subjective: Patient without nausea or emesis this am. Passing a little flatus.  Objective: Vital signs in last 24 hours: Temp:  [98.1 F (36.7 C)-98.9 F (37.2 C)] 98.6 F (37 C) (03/17 0559) Pulse Rate:  [83-111] 83 (03/17 0559) Cardiac Rhythm:  [-] Sinus tachycardia (03/17 0751) Resp:  [16-29] 20 (03/17 0559) BP: (96-172)/(65-91) 122/65 mmHg (03/17 0559) SpO2:  [92 %-100 %] 96 % (03/17 0559) Weight:  [253 lb 8.5 oz (115 kg)] 253 lb 8.5 oz (115 kg) (03/17 0208)  Pre op weight 111 kg Current Weight  03/26/14 253 lb 8.5 oz (115 kg)      Intake/Output from previous day: 03/16 0701 - 03/17 0700 In: 407 [P.O.:360; I.V.:47] Out: 925 [Urine:855; Chest Tube:70]   Physical Exam:  Cardiovascular: Tachycardic Pulmonary: Diminished at bases bilaterally; no rales, wheezes, or rhonchi. Abdomen: Semi soft, distended, occasional high pitched bowel sounds, non tender Extremities: Bilateral lower extremity edema. Wounds: Clean and dry.  No erythema or signs of infection.  Lab Results: CBC: Recent Labs  03/25/14 0325 03/26/14 0639  WBC 16.7* 12.7*  HGB 9.0* 8.8*  HCT 27.8* 28.0*  PLT 142* 155   BMET:  Recent Labs  03/25/14 0325 03/26/14 0639  NA 133* 137  K 4.7 4.2  CL 101 102  CO2 25 27  GLUCOSE 172* 107*  BUN 18 25*  CREATININE 1.64* 1.44*  CALCIUM 8.3* 8.5    PT/INR:  Lab Results  Component Value Date   INR 1.37 03/23/2014   INR 1.06 03/20/2014   INR 1.10 03/18/2014   ABG:  INR: Will add last result for INR, ABG once components are confirmed Will add last 4 CBG results once components are  confirmed  Assessment/Plan:  1. CV - SR in the 80's. On Lopressor 50 mg bid, Hydralazine 10 mg tid. No ACE or ARB until creatinine normalized. 2.  Pulmonary - On 4 liters of oxygen via [place]-wean as tolerates.CXR this am shows no pnuemothorax, opacity RUL (? hemorrhage vs PNA), bibasilar atelectasis, and cardiomegaly. Encourage incentive spirometer 3. Volume Overload - On Lasix 40 IV daily 4.  Acute blood loss anemia - H and H stable at 8.8 and 28. 5. Thrombocytopenia resolved as platelets up to 155,000 6. Renal-Creatinine down to 1.44 7. DM-CBGs 103/125/101. On Insuin 12 bid. Have not been able to restart Metformin secondary to elevated creatinine. 67. GU-KUB shows he has air in the colon and gas pattern likely consistent with an ileus. Continue NPO except sip of water with meds for now. Hopefully, will be able to start sips of clears in am. Try to limit narcotics as much as possible 9. Ambulate tid  ZIMMERMAN,DONIELLE MPA-C 03/26/2014,9:09 AM

## 2014-03-26 NOTE — Progress Notes (Signed)
Pt ambulated 145ft with rolling walker on 4L [place] . Pt tolerated well and maintained 96% O2 sats. Pt breathing at 34 breaths/min. Went back to room and sitting in chair with call bell.   Will continue to monitor.   Earlie Lou

## 2014-03-26 NOTE — Progress Notes (Signed)
CARDIAC REHAB PHASE I   PRE:  Rate/Rhythm: 96 SR  BP:  Supine:   Sitting: 126/67  Standing:    SaO2: 96 4L 90 2L 93 3L   MODE:  Ambulation: 550 ft   POST:  Rate/Rhythm: 99 SR  BP:  Supine:   Sitting: 1188/63  Standing:    SaO2: 93 3L 1000-1100 On arrival pt in bed on O2 4L sat 96%, I reduced O2 to 2L sat 90%. Assisted X 1 used walker and O2 3L to ambulate. Gait steady with walker. Pt able to walk 550 feet with with 2 standing rest stops. VS stable Pt to recliner after walk. I have encouraged pt to stay out of bed and in chair most of the day. Left pt on O2 3L after walk , reported to nurse.I encouraged to do more walking today.  Rodney Langton RN 03/26/2014 10:57 AM

## 2014-03-26 NOTE — Progress Notes (Signed)
Pt assisted back to bed at this time; pt has been up in chair and ambulated x3 today; pt denies nausea or pain at this time; pt states he is "ready to eat"; family at bedside; will cont. To monitor.

## 2014-03-26 NOTE — Progress Notes (Signed)
Pt up ambulating with NT with use of walker and O2 via [place]; steady gait; will cont. To monitor.

## 2014-03-27 ENCOUNTER — Inpatient Hospital Stay (HOSPITAL_COMMUNITY): Payer: Commercial Managed Care - HMO

## 2014-03-27 LAB — CBC
HCT: 26.5 % — ABNORMAL LOW (ref 39.0–52.0)
Hemoglobin: 8.7 g/dL — ABNORMAL LOW (ref 13.0–17.0)
MCH: 27.1 pg (ref 26.0–34.0)
MCHC: 32.8 g/dL (ref 30.0–36.0)
MCV: 82.6 fL (ref 78.0–100.0)
Platelets: 205 10*3/uL (ref 150–400)
RBC: 3.21 MIL/uL — ABNORMAL LOW (ref 4.22–5.81)
RDW: 15.5 % (ref 11.5–15.5)
WBC: 10.4 10*3/uL (ref 4.0–10.5)

## 2014-03-27 LAB — COMPREHENSIVE METABOLIC PANEL
ALT: 20 U/L (ref 0–53)
AST: 23 U/L (ref 0–37)
Albumin: 2.6 g/dL — ABNORMAL LOW (ref 3.5–5.2)
Alkaline Phosphatase: 79 U/L (ref 39–117)
Anion gap: 9 (ref 5–15)
BUN: 24 mg/dL — ABNORMAL HIGH (ref 6–23)
CO2: 25 mmol/L (ref 19–32)
Calcium: 8.5 mg/dL (ref 8.4–10.5)
Chloride: 104 mmol/L (ref 96–112)
Creatinine, Ser: 1.31 mg/dL (ref 0.50–1.35)
GFR calc Af Amer: 64 mL/min — ABNORMAL LOW (ref >90)
GFR calc non Af Amer: 55 mL/min — ABNORMAL LOW (ref >90)
Glucose, Bld: 91 mg/dL (ref 70–99)
Potassium: 4.1 mmol/L (ref 3.5–5.1)
Sodium: 138 mmol/L (ref 135–145)
Total Bilirubin: 0.7 mg/dL (ref 0.3–1.2)
Total Protein: 5.7 g/dL — ABNORMAL LOW (ref 6.0–8.3)

## 2014-03-27 LAB — GLUCOSE, CAPILLARY
Glucose-Capillary: 90 mg/dL (ref 70–99)
Glucose-Capillary: 117 mg/dL — ABNORMAL HIGH (ref 70–99)
Glucose-Capillary: 104 mg/dL — ABNORMAL HIGH (ref 70–99)

## 2014-03-27 MED ORDER — LEVALBUTEROL HCL 1.25 MG/0.5ML IN NEBU
1.2500 mg | INHALATION_SOLUTION | RESPIRATORY_TRACT | Status: DC | PRN
  Filled 2014-03-27: qty 0.5

## 2014-03-27 MED ORDER — GUAIFENESIN ER 600 MG PO TB12
600.0000 mg | ORAL_TABLET | Freq: Two times a day (BID) | ORAL | Status: DC
  Administered 2014-03-27 – 2014-03-29 (×5): 600 mg via ORAL
  Filled 2014-03-27 (×6): qty 1

## 2014-03-27 NOTE — Progress Notes (Signed)
Pt ambulated with NT, O2, and walker.

## 2014-03-27 NOTE — Progress Notes (Signed)
Pt transferred to 2w35; pt and wife made aware of reason for transfer; pt and wife understood; pt denies pain; pt denies nausea; pt tolerated heart healthy diet today; pt "ready to go home"; will cont. To monitor.

## 2014-03-27 NOTE — Progress Notes (Signed)
Medicare Important Message given? YES  (If response is "NO", the following Medicare IM given date fields will be blank)  Date Medicare IM given: 03/27/14 Medicare IM given by:  Susannah Carbin  

## 2014-03-27 NOTE — Progress Notes (Signed)
Flutter valve ordered this AM; pt has not received; RT called; RT to send flutter valve via tube station; will give to pt and educate when available; will cont. To monitor.

## 2014-03-27 NOTE — Progress Notes (Addendum)
[phone] Cardiac Rehab Pt has walked twice today with staff on O2 and has just returned recently. Completed discharge education with pt. He voices understanding. Pt agrees to [place]. [facility] in [place], will send referral. Pt is going to watch recovery from heart surgery video when his wife comes.Discussed smoking cessation with pt. He plans to quit using nicotine patches. Pt seems very motivated to never smoke again.  We will follow pt tomorrow to try to wean O2 and walk without it. Deon Pilling, RN 03/27/2014 3:13 PM

## 2014-03-27 NOTE — Progress Notes (Addendum)
       [address]       [place] [address]             [phone]          4 Days Post-Op Procedure(s) (LRB): CORONARY ARTERY BYPASS GRAFTING (CABG), ON PUMP, TIMES FOUR, USING LEFT INTERNAL MAMMARY ARTERY, BILATERAL GREATER SAPHENOUS VEINS HARVESTED ENDOSCOPICALLY (N/A) TRANSESOPHAGEAL ECHOCARDIOGRAM (TEE) (N/A)  Subjective: Comfortable. Coughing up some thick phlegm this am. Had a BM, nausea resolved.   Objective: Vital signs in last 24 hours: Patient Vitals for the past 24 hrs:  BP Temp Temp src Pulse Resp SpO2 Weight  03/27/14 0513 125/71 mmHg 98.7 F (37.1 C) Oral 85 18 93 % -  03/27/14 0502 136/76 mmHg - - 87 20 94 % -  03/27/14 0500 - - - - - - 249 lb 8 oz (113.172 kg)  03/26/14 2159 - - - - - 94 % -  03/26/14 2102 (!) 124/57 mmHg 98.9 F (37.2 C) Oral 99 (!) 22 94 % -  03/26/14 1634 - - - - - 97 % -  03/26/14 1402 108/60 mmHg 97.8 F (36.6 C) Oral 86 18 98 % -  03/26/14 1355 - - - - - 98 % -   Current Weight  03/27/14 249 lb 8 oz (113.172 kg)  Pre op weight 111 kg   Intake/Output from previous day: 03/17 0701 - 03/18 0700 In: 357.5 [I.V.:357.5] Out: 1575 [Urine:1575] CBGs 104-100-91-90   PHYSICAL EXAM:  Heart: RRR Lungs: Decreased BS in bases bilaterally Wound: Clean and dry Extremities: Trace LE edema    Lab Results: CBC: Recent Labs  03/26/14 0639 03/27/14 0527  WBC 12.7* 10.4  HGB 8.8* 8.7*  HCT 28.0* 26.5*  PLT 155 205   BMET:  Recent Labs  03/26/14 0639 03/27/14 0527  NA 137 138  K 4.2 4.1  CL 102 104  CO2 27 25  GLUCOSE 107* 91  BUN 25* 24*  CREATININE 1.44* 1.31  CALCIUM 8.5 8.5    PT/INR: No results for input(s): LABPROT, INR in the last 72 hours. CXR: FINDINGS: Cardiomegaly and low lung volumes remains stable. Mild bibasilar atelectasis shows no significant change. There is also residual atelectasis or contusion seen in left upper lobe at site of previous chest tube. No pneumothorax  visualized.  IMPRESSION: Stable bibasilar atelectasis and left upper lobe atelectasis versus contusion. No pneumothorax visualized.   Assessment/Plan: S/P Procedure(s) (LRB): CORONARY ARTERY BYPASS GRAFTING (CABG), ON PUMP, TIMES FOUR, USING LEFT INTERNAL MAMMARY ARTERY, BILATERAL GREATER SAPHENOUS VEINS HARVESTED ENDOSCOPICALLY (N/A) TRANSESOPHAGEAL ECHOCARDIOGRAM (TEE) (N/A)  CV- BPs stable, maintaining SR. Continue Lopressor, not on ACE-I yet due to elevated Cr.  GI- advance diet.  ARF- Cr trending down. Monitor.  Vol overload- diurese.  Expected post op blood loss anemia- H/H generally stable.   DM- CBGs stable. Continue insulin until po meds can be resumed.  Pulm- wean O2 as able. Mucinex, FV for thick secretions.  CRPI, pulm toilet.  Possibly home Sunday/Monday if he continues to progress.  LOS: 4 days    COLLINS,GINA H 03/27/2014  Doing well with respect to cardiac status Ileus improved-advance diet slowly patient examined and medical record reviewed,agree with above note. Tharon Aquas Trigt III 03/27/2014

## 2014-03-27 NOTE — Discharge Summary (Signed)
Physician Discharge Summary  Patient ID: Shane [name] MRN: [id number] DOB/AGE: [birth year] 64 y.o.  Admit date: 03/23/2014 Discharge date: 03/29/2014  Admission Diagnoses:  Patient Active Problem List   Diagnosis Date Noted  . Ischemic chest pain   . Chest pain 02/25/2014  . Wellness examination 02/10/2014  . Rectal bleeding 02/10/2014  . Hypogonadism male 10/20/2013  . Impotence of organic origin 08/18/2013  . Vitamin B12 deficiency 02/11/2013  . Closed fracture of unspecified part of upper end of humerus 02/10/2013  . Diabetes 02/10/2013  . Encounter for long-term (current) use of other medications 07/06/2011  . Colon polyps 06/17/2011  . Diverticulosis 06/17/2011  . Contact dermatitis 06/16/2011  . CIGARETTE SMOKER 11/30/2009  . Cough 11/30/2009  . PSA, INCREASED 11/30/2009  . INSOMNIA 09/10/2008  . HYPERCHOLESTEROLEMIA 01/22/2007  . Hyperpotassemia 09/25/2006  . Disorder resulting from impaired renal function 09/25/2006  . Essential hypertension 08/24/2006   Discharge Diagnoses:   Patient Active Problem List   Diagnosis Date Noted  . S/P CABG x 4 03/23/2014  . Ischemic chest pain   . Chest pain 02/25/2014  . Wellness examination 02/10/2014  . Rectal bleeding 02/10/2014  . Hypogonadism male 10/20/2013  . Impotence of organic origin 08/18/2013  . Vitamin B12 deficiency 02/11/2013  . Closed fracture of unspecified part of upper end of humerus 02/10/2013  . Diabetes 02/10/2013  . Encounter for long-term (current) use of other medications 07/06/2011  . Colon polyps 06/17/2011  . Diverticulosis 06/17/2011  . Contact dermatitis 06/16/2011  . CIGARETTE SMOKER 11/30/2009  . Cough 11/30/2009  . PSA, INCREASED 11/30/2009  . INSOMNIA 09/10/2008  . HYPERCHOLESTEROLEMIA 01/22/2007  . Hyperpotassemia 09/25/2006  . Disorder resulting from impaired renal function 09/25/2006  . Essential hypertension 08/24/2006   Discharged Condition: good  History of Present Illness:    Shane [name] is a 64 yo obese male with known history of DM and nicotine abuse.  Over the past several months thpatient developed symptoms of increasing dyspnea on exertion and decreased exercise tolerance.  He also had exertional left sided chest discomfort.  He presented to his PCP with these complaints and he was set up for a stress test.  This was performed and showed a reversible area of inferior ischemia and a reduced EF of 45 %.  Due to this he was referred to Dr. Burt Knack for evaluation.  The patient was evaluated and taken for cardiac catheterization.  This showed severe multivessel CAD.  It was felt he should have coronary bypass grafting procedure and TCTS consult was obtained.   Hospital Course:   During admission patient remained chest pain free.  He was evaluated by Dr. Prescott Gum who was in agreement the patient would benefit from Coronary Bypass grafting procedure.  He was taken to the operating room on 03/23/2014.  He underwent CABG x 4 utilizing LIMA to LAD, SVG to DIAGONAL, SVG to OM1, and SVG to RCA.  He also underwent endoscopic saphenous vein harvest from the right and left thigh.  He tolerated the procedure without difficulty and was taken to the SICU in stable condition.  The patient was extubated the evening of surgery.  During his stay in the SICU the patient was weaned off NTG drip as tolerated.  His chest tube and arterial lines were removed without difficulty.  He remained hypertensive and was started on Hydralazine for additional support.  He was diuresed for hypervolemia.  He was maintaining NSR and felt medically stable for transfer to the stepdown unit in  stable condition.  The patient continues to progress.  He is maintaining NSR and his pacing wires were removed without difficulty.  He developed emesis of dark brown consistency. KUB was obtained and showed bowel dilation concerning for early Ileus.  He was kept NPO for except for sips of water.  This has slowly resolved and the  patient is moving his bowels without difficulty.  The patient's creatinine is mildly elevated (1.34) and he will be restarted on his home Metformin at discharge.  He continues to ambulate without difficulty.  He is on room air. He is tolerating a carb modified diet and has had a bowel movement.  He is felt medically stable for discharge home today.          Significant Diagnostic Studies: angiography:   Hemodynamics: AO 111/59 with a mean of 84 LV 112/9  Coronary angiography: Coronary dominance: right  Left mainstem: The left mainstem arises from left coronary cusp. There is moderate calcification. The left main has irregularity but no significant stenosis.  Left anterior descending (LAD): The LAD is severely diseased. The proximal LAD is patent, but at the origin of the first diagonal there is severe diffuse 80-90% stenosis with a plaque ulceration extending beyond the first septal perforating branch. The first diagonal is fairly large in caliber and it has a 90% stenosis. The mid and distal LAD are patent with mild irregularity and they appear to be a reasonable target for grafting.  Left circumflex (LCx): The left circumflex is large in caliber. The vessel is calcified. The proximal circumflex has 75% stenosis. There is a long 50% stenosis beyond this. The first OM is large in caliber without significant stenosis. The second OM is small in caliber.  Right coronary artery (RCA): The RCA is calcified. The mid vessel is totally occluded. The PDA and PLA branches fill from left to right collaterals  Left ventriculography: The basal inferior wall is mildly hypokinetic. The other LV wall segments contract normally. The estimated LVEF is 55%.  Treatments: surgery:   . Coronary artery bypass grafting x4 (left internal mammary artery to  left anterior descending artery, saphenous vein graft to diagonal,  saphenous vein graft to circumflex marginal, saphenous vein graft  to right  ventricular marginal). 2. Endoscopic harvest of bilateral greater saphenous veins.  Disposition: 01-Home or Self Care   Discharge Medications:   Medication List    STOP taking these medications        aspirin 81 MG tablet  Replaced by:  aspirin 325 MG EC tablet     hydrochlorothiazide 12.5 MG capsule  Commonly known as:  MICROZIDE     ibuprofen 200 MG tablet  Commonly known as:  ADVIL,MOTRIN     NON FORMULARY      TAKE these medications        aspirin 325 MG EC tablet  Take 1 tablet (325 mg total) by mouth daily.     atorvastatin 80 MG tablet  Commonly known as:  LIPITOR  Take 1 tablet (80 mg total) by mouth daily.     budesonide-formoterol 160-4.5 MCG/ACT inhaler  Commonly known as:  SYMBICORT  Inhale 2 puffs into the lungs 2 (two) times daily.     ferrous [id number] C-folic acid capsule  Commonly known as:  TRINSICON / FOLTRIN  Take 1 capsule by mouth daily. For one month then stop.     furosemide 40 MG tablet  Commonly known as:  LASIX  Take 1 tablet (40 mg total) by mouth  daily. For 4 days then stop.     hydrALAZINE 10 MG tablet  Commonly known as:  APRESOLINE  Take 1 tablet (10 mg total) by mouth every 8 (eight) hours.     hydrocortisone 1 % lotion  Apply 1 application topically 3 (three) times daily. As needed for itching     metFORMIN 500 MG 24 hr tablet  Commonly known as:  GLUCOPHAGE-XR  TAKE ONE TABLET BY MOUTH ONCE DAILY     metoprolol 50 MG tablet  Commonly known as:  LOPRESSOR  Take 1 tablet (50 mg total) by mouth 2 (two) times daily.     nicotine 14 mg/24hr patch  Commonly known as:  NICODERM CQ - dosed in mg/24 hours  Place 1 patch (14 mg total) onto the skin daily.     omeprazole 20 MG capsule  Commonly known as:  PRILOSEC  Take 1 capsule (20 mg total) by mouth daily.     oxyCODONE 5 MG immediate release tablet  Commonly known as:  Oxy IR/ROXICODONE  Take 1-2 tablets (5-10 mg total) by mouth every 3 (three) hours as needed  for severe pain.     potassium chloride SA 20 MEQ tablet  Commonly known as:  K-DUR,KLOR-CON  Take 1 tablet (20 mEq total) by mouth daily. For 4 days then stop.     traZODone 150 MG tablet  Commonly known as:  DESYREL  TAKE ONE TABLET BY MOUTH AT BEDTIME       The patient has been discharged on:   1.Beta Blocker:  Yes [ x  ]                              No   [   ]                              If No, reason:  2.Ace Inhibitor/ARB: Yes [   ]                                     No  [  x  ]                                     If No, reason: Renal Insufficiency  3.Statin:   Yes [ x  ]                  No  [   ]                  If No, reason:  4.Ecasa:  Yes  [x   ]                  No   [   ]                  If No, reason:   Follow-up Information    Follow up with Len Childs, MD On 04/29/2014.   Specialty:  Cardiothoracic Surgery   Why:  ppointment is at 3:30   Contact information:   [place] [address] [phone]       Follow up with [facility] On 04/29/2014.   Why:  please get CXR at 3:00   Contact information:   [facility]       Follow up with [name], MD.   Specialty:  Cardiology   Why:  Call for a follow up appointment for 2 weeks   Contact information:   [address] [place] [address] [phone]       Signed: Nani Skillern PA-C 03/29/2014, 11:04 AM

## 2014-03-27 NOTE — Progress Notes (Signed)
Utilization review completed.  

## 2014-03-27 NOTE — Discharge Instructions (Signed)
Coronary Artery Bypass Grafting, Care After °Refer to this sheet in the next few weeks. These instructions provide you with information on caring for yourself after your procedure. Your health care provider may also give you more specific instructions. Your treatment has been planned according to current medical practices, but problems sometimes occur. Call your health care provider if you have any problems or questions after your procedure. °WHAT TO EXPECT AFTER THE PROCEDURE °Recovery from surgery will be different for everyone. Some people feel well after 3 or 4 weeks, while for others it takes longer. After your procedure, it is typical to have the following: °· Nausea and a lack of appetite.   °· Constipation. °· Weakness and fatigue.   °· Depression or irritability.   °· Pain or discomfort at your incision site. °HOME CARE INSTRUCTIONS °· Take medicines only as directed by your health care provider. Do not stop taking medicines or start any new medicines without first checking with your health care provider. °· Take your pulse as directed by your health care provider. °· Perform deep breathing as directed by your health care provider. If you were given a device called an incentive spirometer, use it to practice deep breathing several times a day. Support your chest with a pillow or your arms when you take deep breaths or cough. °· Keep incision areas clean, dry, and protected. Remove or change any bandages (dressings) only as directed by your health care provider. You may have skin adhesive strips over the incision areas. Do not take the strips off. They will fall off on their own. °· Check incision areas daily for any swelling, redness, or drainage. °· If incisions were made in your legs, do the following: °¨ Avoid crossing your legs.   °¨ Avoid sitting for long periods of time. Change positions every 30 minutes.   °¨ Elevate your legs when you are sitting. °· Wear compression stockings as directed by your  health care provider. These stockings help keep blood clots from forming in your legs. °· Take showers once your health care provider approves. Until then, only take sponge baths. Pat incisions dry. Do not rub incisions with a washcloth or towel. Do not take baths, swim, or use a hot tub until your health care provider approves. °· Eat foods that are high in fiber, such as raw fruits and vegetables, whole grains, beans, and nuts. Meats should be lean cut. Avoid canned, processed, and fried foods. °· Drink enough fluid to keep your urine clear or pale yellow. °· Weigh yourself every day. This helps identify if you are retaining fluid that may make your heart and lungs work harder. °· Rest and limit activity as directed by your health care provider. You may be instructed to: °¨ Stop any activity at once if you have chest pain, shortness of breath, irregular heartbeats, or dizziness. Get help right away if you have any of these symptoms. °¨ Move around frequently for short periods or take short walks as directed by your health care provider. Increase your activities gradually. You may need physical therapy or cardiac rehabilitation to help strengthen your muscles and build your endurance. °¨ Avoid lifting, pushing, or pulling anything heavier than 10 lb (4.5 kg) for at least 6 weeks after surgery. °· Do not drive until your health care provider approves.  °· Ask your health care provider when you may return to work. °· Ask your health care provider when you may resume sexual activity. °· Keep all follow-up visits as directed by your health care   provider. This is important. °SEEK MEDICAL CARE IF: °· You have swelling, redness, increasing pain, or drainage at the site of an incision. °· You have a fever. °· You have swelling in your ankles or legs. °· You have pain in your legs.   °· You gain 2 or more pounds (0.9 kg) a day. °· You are nauseous or vomit. °· You have diarrhea.  °SEEK IMMEDIATE MEDICAL CARE IF: °· You have  chest pain that goes to your jaw or arms. °· You have shortness of breath.   °· You have a fast or irregular heartbeat.   °· You notice a "clicking" in your breastbone (sternum) when you move.   °· You have numbness or weakness in your arms or legs. °· You feel dizzy or light-headed.   °MAKE SURE YOU: °· Understand these instructions. °· Will watch your condition. °· Will get help right away if you are not doing well or get worse. °Document Released: 07/15/2004 Document Revised: 05/12/2013 Document Reviewed: 06/04/2012 °[employer]® Patient Information ©2015 [employer]. This information is not intended to replace advice given to you by your health care provider. Make sure you discuss any questions you have with your health care provider. ° °Endoscopic Saphenous Vein Harvesting °Care After °Refer to this sheet in the next few weeks. These instructions provide you with information on caring for yourself after your procedure. Your health care provider may also give you more specific instructions. Your treatment has been planned according to current medical practices, but problems sometimes occur. Call your health care provider if you have any problems or questions after your procedure. °HOME CARE INSTRUCTIONS °Medicine °· Take whatever pain medicine your surgeon prescribes. Follow the directions carefully. Do not take over-the-counter pain medicine unless your surgeon says it is okay. Some pain medicine can cause bleeding problems for several weeks after surgery. °· Follow your surgeon's instructions about driving. You will probably not be permitted to drive after heart surgery. °· Take any medicines your surgeon prescribes. Any medicines you took before your heart surgery should be checked with your health care provider before you start taking them again. °Wound care °· If your surgeon has prescribed an elastic bandage or stocking, ask how long you should wear it. °· Check the area around your surgical cuts  (incisions) whenever your bandages (dressings) are changed. Look for any redness or swelling. °· You will need to return to have the stitches (sutures) or staples taken out. Ask your surgeon when to do that. °· Ask your surgeon when you can shower or bathe. °Activity °· Try to keep your legs raised when you are sitting. °· Do any exercises your health care providers have given you. These may include deep breathing exercises, coughing, walking, or other exercises. °SEEK MEDICAL CARE IF: °· You have any questions about your medicines. °· You have more leg pain, especially if your pain medicine stops working. °· New or growing bruises develop on your leg. °· Your leg swells, feels tight, or becomes red. °· You have numbness in your leg. °SEEK IMMEDIATE MEDICAL CARE IF: °· Your pain gets much worse. °· Blood or fluid leaks from any of the incisions. °· Your incisions become warm, swollen, or red. °· You have chest pain. °· You have trouble breathing. °· You have a fever. °· You have more pain near your leg incision. °MAKE SURE YOU: °· Understand these instructions. °· Will watch your condition. °· Will get help right away if you are not doing well or get worse. °Document Released: 09/07/2010   Document Revised: 12/31/2012 Document Reviewed: 09/07/2010 °ExitCare® Patient Information ©2015 [employer]. This information is not intended to replace advice given to you by your health care provider. Make sure you discuss any questions you have with your health care provider. ° ° °

## 2014-03-28 LAB — BASIC METABOLIC PANEL
ANION GAP: 8 (ref 5–15)
BUN: 25 mg/dL — ABNORMAL HIGH (ref 6–23)
CHLORIDE: 102 mmol/L (ref 96–112)
CO2: 28 mmol/L (ref 19–32)
Calcium: 8.1 mg/dL — ABNORMAL LOW (ref 8.4–10.5)
Creatinine, Ser: 1.34 mg/dL (ref 0.50–1.35)
GFR calc Af Amer: 62 mL/min — ABNORMAL LOW (ref >90)
GFR, EST NON AFRICAN AMERICAN: 54 mL/min — AB (ref >90)
Glucose, Bld: 104 mg/dL — ABNORMAL HIGH (ref 70–99)
POTASSIUM: 4.1 mmol/L (ref 3.5–5.1)
Sodium: 138 mmol/L (ref 135–145)

## 2014-03-28 LAB — GLUCOSE, CAPILLARY
Glucose-Capillary: 94 mg/dL (ref 70–99)
Glucose-Capillary: 117 mg/dL — ABNORMAL HIGH (ref 70–99)
Glucose-Capillary: 84 mg/dL (ref 70–99)
Glucose-Capillary: 85 mg/dL (ref 70–99)

## 2014-03-28 MED ORDER — POTASSIUM CHLORIDE CRYS ER 20 MEQ PO TBCR
20.0000 meq | EXTENDED_RELEASE_TABLET | Freq: Every day | ORAL | Status: DC
  Administered 2014-03-28 – 2014-03-29 (×2): 20 meq via ORAL
  Filled 2014-03-28 (×3): qty 1

## 2014-03-28 MED ORDER — FUROSEMIDE 40 MG PO TABS
40.0000 mg | ORAL_TABLET | Freq: Every day | ORAL | Status: DC
  Administered 2014-03-28 – 2014-03-29 (×2): 40 mg via ORAL
  Filled 2014-03-28 (×3): qty 1

## 2014-03-28 MED ORDER — FE FUMARATE-B12-VIT C-FA-IFC PO CAPS
1.0000 | ORAL_CAPSULE | Freq: Every day | ORAL | Status: DC
  Administered 2014-03-28 – 2014-03-29 (×2): 1 via ORAL
  Filled 2014-03-28 (×2): qty 1

## 2014-03-28 NOTE — Progress Notes (Addendum)
      [address]       [place] [address]             [phone]        5 Days Post-Op Procedure(s) (LRB): CORONARY ARTERY BYPASS GRAFTING (CABG), ON PUMP, TIMES FOUR, USING LEFT INTERNAL MAMMARY ARTERY, BILATERAL GREATER SAPHENOUS VEINS HARVESTED ENDOSCOPICALLY (N/A) TRANSESOPHAGEAL ECHOCARDIOGRAM (TEE) (N/A)  Subjective: Patient feels fairly well. Has had multiple bowel movements.  Objective: Vital signs in last 24 hours: Temp:  [97.8 F (36.6 C)-99.8 F (37.7 C)] 97.8 F (36.6 C) (03/19 0450) Pulse Rate:  [78-98] 78 (03/19 0450) Cardiac Rhythm:  [-] Normal sinus rhythm (03/18 2127) Resp:  [16-20] 20 (03/19 0450) BP: (102-122)/(62-84) 102/62 mmHg (03/19 0518) SpO2:  [93 %-99 %] 99 % (03/19 0450) Weight:  [248 lb 9.6 oz (112.764 kg)] 248 lb 9.6 oz (112.764 kg) (03/19 0450)  Pre op weight 111 kg Current Weight  03/28/14 248 lb 9.6 oz (112.764 kg)      Intake/Output from previous day: 03/18 0701 - 03/19 0700 In: 885.5 [P.O.:720; I.V.:165.5] Out: 1850 [Urine:1850]   Physical Exam:  Cardiovascular: RRR Pulmonary: Diminished at bases bilaterally; no rales, wheezes, or rhonchi. Abdomen: Semi soft, distended, occasional high pitched bowel sounds, non tender Extremities: Bilateral lower extremity edema. Wounds: Sternal wound is lean and dry.  No erythema or signs of infection. Right thigh wound with clearish fluid. Dressing applied.  Lab Results: CBC:  Recent Labs  03/26/14 0639 03/27/14 0527  WBC 12.7* 10.4  HGB 8.8* 8.7*  HCT 28.0* 26.5*  PLT 155 205   BMET:   Recent Labs  03/27/14 0527 03/28/14 0354  NA 138 138  K 4.1 4.1  CL 104 102  CO2 25 28  GLUCOSE 91 104*  BUN 24* 25*  CREATININE 1.31 1.34  CALCIUM 8.5 8.1*    PT/INR:  Lab Results  Component Value Date   INR 1.37 03/23/2014   INR 1.06 03/20/2014   INR 1.10 03/18/2014   ABG:  INR: Will add last result for INR, ABG once components are confirmed Will add last 4  CBG results once components are confirmed  Assessment/Plan:  1. CV - SR in the 80's. On Lopressor 50 mg bid, Hydralazine 10 mg tid. No ACE or ARB until creatinine normalized. 2.  Pulmonary - On 2 liters of oxygen via [place]-wean as tolerates. May need home oxygen.Encourage incentive spirometer and flutter valve 3. Volume Overload - On Lasix 40 IV daily. Will give orally today. 4.  Acute blood loss anemia - H and H stable at 8.8 and 28. Start Trinsicon 5. Renal-Creatinine stable at 1.34 6. DM-CBGs 117/104/94. On Insuin 12 bid. Have not restarted Metformin secondary to elevated creatinine. 7. Possible discharge in am   ZIMMERMAN,DONIELLE MPA-C 03/28/2014,9:46 AM   Chart reviewed, patient examined, agree with above. He should be ready to go home tomorrow.

## 2014-03-28 NOTE — Progress Notes (Signed)
CARDIAC REHAB PHASE I   PRE:  Rate/Rhythm: 96 SR  BP:  Supine:   Sitting: 110/60  Standing:    SaO2: 93 RA at rest  MODE:  Ambulation: 400 ft   POST:  Rate/Rhythm: 96 SR  BP:  Supine:   Sitting: 120/64  Standing:    SaO2: 93-94% 1liter of oxygen Attempted ambulation on RA, but desaturated to 84%, placed on 2 liters and saturation increased to 97%, decreased to 1 liter and maintained saturation 93-94%.  Reported oxygen was decreased to 1 liter to nurse, Edson.   [id number] Liliane Channel RN, BSN 03/28/2014 11:02 AM

## 2014-03-29 LAB — GLUCOSE, CAPILLARY
Glucose-Capillary: 97 mg/dL (ref 70–99)
Glucose-Capillary: 93 mg/dL (ref 70–99)
Glucose-Capillary: 104 mg/dL — ABNORMAL HIGH (ref 70–99)

## 2014-03-29 MED ORDER — METOPROLOL TARTRATE 50 MG PO TABS
50.0000 mg | ORAL_TABLET | Freq: Two times a day (BID) | ORAL | Status: DC
Start: 2014-03-29 — End: 2014-06-10

## 2014-03-29 MED ORDER — POTASSIUM CHLORIDE CRYS ER 20 MEQ PO TBCR: 20.0000 meq | EXTENDED_RELEASE_TABLET | Freq: Every day | ORAL | Status: DC

## 2014-03-29 MED ORDER — OXYCODONE HCL 5 MG PO TABS: 5.0000 mg | ORAL_TABLET | ORAL | Status: DC | PRN

## 2014-03-29 MED ORDER — FUROSEMIDE 40 MG PO TABS
40.0000 mg | ORAL_TABLET | Freq: Every day | ORAL | Status: DC
Start: 2014-03-29 — End: 2014-04-20

## 2014-03-29 MED ORDER — BUDESONIDE-FORMOTEROL FUMARATE 160-4.5 MCG/ACT IN AERO: 2.0000 | INHALATION_SPRAY | Freq: Two times a day (BID) | RESPIRATORY_TRACT | Status: DC

## 2014-03-29 MED ORDER — ASPIRIN 325 MG PO TBEC: 325.0000 mg | DELAYED_RELEASE_TABLET | Freq: Every day | ORAL | Status: DC

## 2014-03-29 MED ORDER — METFORMIN HCL ER 500 MG PO TB24
500.0000 mg | ORAL_TABLET | Freq: Every day | ORAL | Status: DC
  Filled 2014-03-29: qty 1

## 2014-03-29 MED ORDER — HYDRALAZINE HCL 10 MG PO TABS: 10.0000 mg | ORAL_TABLET | Freq: Three times a day (TID) | ORAL | Status: DC

## 2014-03-29 MED ORDER — NICOTINE 14 MG/24HR TD PT24: 14.0000 mg | MEDICATED_PATCH | Freq: Every day | TRANSDERMAL | Status: DC

## 2014-03-29 MED ORDER — FE FUMARATE-B12-VIT C-FA-IFC PO CAPS: 1.0000 | ORAL_CAPSULE | Freq: Every day | ORAL | Status: DC

## 2014-03-29 NOTE — Progress Notes (Signed)
      [address]       [place] [address]             [phone]        6 Days Post-Op Procedure(s) (LRB): CORONARY ARTERY BYPASS GRAFTING (CABG), ON PUMP, TIMES FOUR, USING LEFT INTERNAL MAMMARY ARTERY, BILATERAL GREATER SAPHENOUS VEINS HARVESTED ENDOSCOPICALLY (N/A) TRANSESOPHAGEAL ECHOCARDIOGRAM (TEE) (N/A)  Subjective: Patient feels fairly well and wants to go home.  Objective: Vital signs in last 24 hours: Temp:  [97.8 F (36.6 C)-98.8 F (37.1 C)] 97.8 F (36.6 C) (03/20 0501) Pulse Rate:  [76-91] 85 (03/20 0501) Cardiac Rhythm:  [-] Normal sinus rhythm (03/19 2330) Resp:  [18-24] 20 (03/20 0501) BP: (116-137)/(59-67) 137/67 mmHg (03/20 0501) SpO2:  [93 %-97 %] 96 % (03/20 0501) Weight:  [148 lb 3.2 oz (67.223 kg)] 148 lb 3.2 oz (67.223 kg) (03/20 0501)  Pre op weight 111 kg Current Weight  03/29/14 148 lb 3.2 oz (67.223 kg)      Intake/Output from previous day: 03/19 0701 - 03/20 0700 In: 720 [P.O.:720] Out: 475 [Urine:475]   Physical Exam:  Cardiovascular: RRR Pulmonary: Diminished at bases bilaterally; no rales, wheezes, or rhonchi. Abdomen: Semi soft, distended, occasional high pitched bowel sounds, non tender Extremities: Bilateral lower extremity edema. Wounds: Sternal wound is lean and dry.  No erythema or signs of infection. Right thigh wound is mostly dry this am   Lab Results: CBC:  Recent Labs  03/27/14 0527  WBC 10.4  HGB 8.7*  HCT 26.5*  PLT 205   BMET:   Recent Labs  03/27/14 0527 03/28/14 0354  NA 138 138  K 4.1 4.1  CL 104 102  CO2 25 28  GLUCOSE 91 104*  BUN 24* 25*  CREATININE 1.31 1.34  CALCIUM 8.5 8.1*    PT/INR:  Lab Results  Component Value Date   INR 1.37 03/23/2014   INR 1.06 03/20/2014   INR 1.10 03/18/2014   ABG:  INR: Will add last result for INR, ABG once components are confirmed Will add last 4 CBG results once components are confirmed  Assessment/Plan:  1. CV - SR in the  80's. On Lopressor 50 mg bid, Hydralazine 10 mg tid.  2.  Pulmonary - On 1 liter of oxygen and will need home oxygen.Encourage incentive spirometer and flutter valve 3. Volume Overload - On Lasix 40 mg daily 4.  Acute blood loss anemia - H and H stable at 8.8 and 28. Start Trinsicon 5. Renal-Creatinine stable at 1.34 6. DM-CBGs 84/85/97. On Insuin 12 bid. Stop Insulin and will restart Metformin as taken pre op. 7. Will discharge today   ZIMMERMAN,DONIELLE MPA-C 03/29/2014,9:05 AM

## 2014-03-29 NOTE — Progress Notes (Signed)
SATURATION QUALIFICATIONS: (This note is used to comply with regulatory documentation for home oxygen)  Patient Saturations on Room Air at Rest = 92%  Patient Saturations on Room Air while Ambulating = 84%  Patient Saturations on 1 Liters of oxygen while Ambulating  = 96 %  Please briefly explain why patient needs home oxygen: saturation drops and patient becomes short of breath with exertion

## 2014-03-29 NOTE — Care Management Note (Signed)
    Page 1 of 2   03/29/2014     5:26:14 PM CARE MANAGEMENT NOTE 03/29/2014  Patient:  Shane [name], Shane [name]   Account Number:  [id number]  Date Initiated:  03/24/2014  Documentation initiated by:  [facility]  Subjective/Objective Assessment:   Post op CABG x4     Action/Plan:   discahrge planning   Anticipated DC Date:  03/27/2014   Anticipated DC Plan:  [place]  CM consult      [facility] Choice  HOME HEALTH   Choice offered to / List presented to:  C-1 Patient   DME arranged  [facility]      DME agency  [facility]     HH arranged  HH-1 RN      [place].   Status of service:  Completed, signed off Medicare Important Message given?  YES (If response is "NO", the following Medicare IM given date fields will be blank) Date Medicare IM given:  03/27/2014 Medicare IM given by:  Marvetta Gibbons Date Additional Medicare IM given:   Additional Medicare IM given by:    Discharge Disposition:  [place]  Per UR Regulation:  Reviewed for med. necessity/level of care/duration of stay  If discussed at [place] of Stay Meetings, dates discussed:    Comments:  03/29/14 17:18 CM met with pt and pt's wife in room to offer choice of home health agency.  Pt chooses [facility] to render [facility].  Pt has [employer] and has a DME order for home oxygen.  CM faxed the order and spoke with [employer] rep at  to ensure the fax was received at 14:35.  CM received verification of receipt at 14:25.  CM called [phone] and spoke with Anderson Malta at 17:23 and Anderson Malta stated the oxygen will be delivered to the hospital room within the 4 hour time frame.  No other CM needs were communicated. Shane [name], BSN, CM [phone].  Contact:  Shane [name],Shane [name] Spouse [phone]      Shane [name],Shane [name] Sister [phone]   03-25-14 12noon Luz Lex, RNBSN [phone] Lives at home with spouse.  She works part time but plans to be  home with him 24/7 on discharage or someone else will fro 7-10 days.  CM will continue to follow for needs.

## 2014-03-31 DIAGNOSIS — N189 Chronic kidney disease, unspecified: Secondary | ICD-10-CM | POA: Diagnosis not present

## 2014-03-31 DIAGNOSIS — Z951 Presence of aortocoronary bypass graft: Secondary | ICD-10-CM | POA: Diagnosis not present

## 2014-03-31 DIAGNOSIS — I129 Hypertensive chronic kidney disease with stage 1 through stage 4 chronic kidney disease, or unspecified chronic kidney disease: Secondary | ICD-10-CM | POA: Diagnosis not present

## 2014-03-31 DIAGNOSIS — Z48812 Encounter for surgical aftercare following surgery on the circulatory system: Secondary | ICD-10-CM | POA: Diagnosis not present

## 2014-03-31 DIAGNOSIS — F17210 Nicotine dependence, cigarettes, uncomplicated: Secondary | ICD-10-CM | POA: Diagnosis not present

## 2014-03-31 DIAGNOSIS — E119 Type 2 diabetes mellitus without complications: Secondary | ICD-10-CM | POA: Diagnosis not present

## 2014-03-31 DIAGNOSIS — Z9981 Dependence on supplemental oxygen: Secondary | ICD-10-CM | POA: Diagnosis not present

## 2014-03-31 DIAGNOSIS — E538 Deficiency of other specified B group vitamins: Secondary | ICD-10-CM | POA: Diagnosis not present

## 2014-03-31 DIAGNOSIS — I2510 Atherosclerotic heart disease of native coronary artery without angina pectoris: Secondary | ICD-10-CM | POA: Diagnosis not present

## 2014-04-01 ENCOUNTER — Ambulatory Visit: Payer: Medicare HMO | Admitting: Gastroenterology

## 2014-04-02 DIAGNOSIS — F17210 Nicotine dependence, cigarettes, uncomplicated: Secondary | ICD-10-CM | POA: Diagnosis not present

## 2014-04-02 DIAGNOSIS — E538 Deficiency of other specified B group vitamins: Secondary | ICD-10-CM | POA: Diagnosis not present

## 2014-04-02 DIAGNOSIS — I129 Hypertensive chronic kidney disease with stage 1 through stage 4 chronic kidney disease, or unspecified chronic kidney disease: Secondary | ICD-10-CM | POA: Diagnosis not present

## 2014-04-02 DIAGNOSIS — E119 Type 2 diabetes mellitus without complications: Secondary | ICD-10-CM | POA: Diagnosis not present

## 2014-04-02 DIAGNOSIS — I2510 Atherosclerotic heart disease of native coronary artery without angina pectoris: Secondary | ICD-10-CM | POA: Diagnosis not present

## 2014-04-02 DIAGNOSIS — N189 Chronic kidney disease, unspecified: Secondary | ICD-10-CM | POA: Diagnosis not present

## 2014-04-02 DIAGNOSIS — Z951 Presence of aortocoronary bypass graft: Secondary | ICD-10-CM | POA: Diagnosis not present

## 2014-04-02 DIAGNOSIS — Z48812 Encounter for surgical aftercare following surgery on the circulatory system: Secondary | ICD-10-CM | POA: Diagnosis not present

## 2014-04-02 DIAGNOSIS — Z9981 Dependence on supplemental oxygen: Secondary | ICD-10-CM | POA: Diagnosis not present

## 2014-04-06 DIAGNOSIS — R0902 Hypoxemia: Secondary | ICD-10-CM | POA: Diagnosis not present

## 2014-04-06 DIAGNOSIS — R0602 Shortness of breath: Secondary | ICD-10-CM | POA: Diagnosis not present

## 2014-04-08 DIAGNOSIS — Z48812 Encounter for surgical aftercare following surgery on the circulatory system: Secondary | ICD-10-CM | POA: Diagnosis not present

## 2014-04-08 DIAGNOSIS — Z951 Presence of aortocoronary bypass graft: Secondary | ICD-10-CM | POA: Diagnosis not present

## 2014-04-08 DIAGNOSIS — I129 Hypertensive chronic kidney disease with stage 1 through stage 4 chronic kidney disease, or unspecified chronic kidney disease: Secondary | ICD-10-CM | POA: Diagnosis not present

## 2014-04-08 DIAGNOSIS — Z9981 Dependence on supplemental oxygen: Secondary | ICD-10-CM | POA: Diagnosis not present

## 2014-04-08 DIAGNOSIS — F17210 Nicotine dependence, cigarettes, uncomplicated: Secondary | ICD-10-CM | POA: Diagnosis not present

## 2014-04-08 DIAGNOSIS — E119 Type 2 diabetes mellitus without complications: Secondary | ICD-10-CM | POA: Diagnosis not present

## 2014-04-08 DIAGNOSIS — I2510 Atherosclerotic heart disease of native coronary artery without angina pectoris: Secondary | ICD-10-CM | POA: Diagnosis not present

## 2014-04-08 DIAGNOSIS — E538 Deficiency of other specified B group vitamins: Secondary | ICD-10-CM | POA: Diagnosis not present

## 2014-04-08 DIAGNOSIS — N189 Chronic kidney disease, unspecified: Secondary | ICD-10-CM | POA: Diagnosis not present

## 2014-04-09 DIAGNOSIS — E538 Deficiency of other specified B group vitamins: Secondary | ICD-10-CM | POA: Diagnosis not present

## 2014-04-09 DIAGNOSIS — N189 Chronic kidney disease, unspecified: Secondary | ICD-10-CM | POA: Diagnosis not present

## 2014-04-09 DIAGNOSIS — I129 Hypertensive chronic kidney disease with stage 1 through stage 4 chronic kidney disease, or unspecified chronic kidney disease: Secondary | ICD-10-CM | POA: Diagnosis not present

## 2014-04-09 DIAGNOSIS — Z48812 Encounter for surgical aftercare following surgery on the circulatory system: Secondary | ICD-10-CM | POA: Diagnosis not present

## 2014-04-09 DIAGNOSIS — Z951 Presence of aortocoronary bypass graft: Secondary | ICD-10-CM | POA: Diagnosis not present

## 2014-04-09 DIAGNOSIS — E119 Type 2 diabetes mellitus without complications: Secondary | ICD-10-CM | POA: Diagnosis not present

## 2014-04-09 DIAGNOSIS — F17210 Nicotine dependence, cigarettes, uncomplicated: Secondary | ICD-10-CM | POA: Diagnosis not present

## 2014-04-09 DIAGNOSIS — Z9981 Dependence on supplemental oxygen: Secondary | ICD-10-CM | POA: Diagnosis not present

## 2014-04-09 DIAGNOSIS — I2510 Atherosclerotic heart disease of native coronary artery without angina pectoris: Secondary | ICD-10-CM | POA: Diagnosis not present

## 2014-04-10 DIAGNOSIS — Z9981 Dependence on supplemental oxygen: Secondary | ICD-10-CM | POA: Diagnosis not present

## 2014-04-10 DIAGNOSIS — F17210 Nicotine dependence, cigarettes, uncomplicated: Secondary | ICD-10-CM | POA: Diagnosis not present

## 2014-04-10 DIAGNOSIS — Z951 Presence of aortocoronary bypass graft: Secondary | ICD-10-CM | POA: Diagnosis not present

## 2014-04-10 DIAGNOSIS — Z48812 Encounter for surgical aftercare following surgery on the circulatory system: Secondary | ICD-10-CM | POA: Diagnosis not present

## 2014-04-10 DIAGNOSIS — E538 Deficiency of other specified B group vitamins: Secondary | ICD-10-CM | POA: Diagnosis not present

## 2014-04-10 DIAGNOSIS — I129 Hypertensive chronic kidney disease with stage 1 through stage 4 chronic kidney disease, or unspecified chronic kidney disease: Secondary | ICD-10-CM | POA: Diagnosis not present

## 2014-04-10 DIAGNOSIS — N189 Chronic kidney disease, unspecified: Secondary | ICD-10-CM | POA: Diagnosis not present

## 2014-04-10 DIAGNOSIS — E119 Type 2 diabetes mellitus without complications: Secondary | ICD-10-CM | POA: Diagnosis not present

## 2014-04-10 DIAGNOSIS — I2510 Atherosclerotic heart disease of native coronary artery without angina pectoris: Secondary | ICD-10-CM | POA: Diagnosis not present

## 2014-04-13 DIAGNOSIS — I2510 Atherosclerotic heart disease of native coronary artery without angina pectoris: Secondary | ICD-10-CM | POA: Diagnosis not present

## 2014-04-13 DIAGNOSIS — Z951 Presence of aortocoronary bypass graft: Secondary | ICD-10-CM | POA: Diagnosis not present

## 2014-04-13 DIAGNOSIS — E538 Deficiency of other specified B group vitamins: Secondary | ICD-10-CM | POA: Diagnosis not present

## 2014-04-13 DIAGNOSIS — E119 Type 2 diabetes mellitus without complications: Secondary | ICD-10-CM | POA: Diagnosis not present

## 2014-04-13 DIAGNOSIS — F17210 Nicotine dependence, cigarettes, uncomplicated: Secondary | ICD-10-CM | POA: Diagnosis not present

## 2014-04-13 DIAGNOSIS — I129 Hypertensive chronic kidney disease with stage 1 through stage 4 chronic kidney disease, or unspecified chronic kidney disease: Secondary | ICD-10-CM | POA: Diagnosis not present

## 2014-04-13 DIAGNOSIS — Z48812 Encounter for surgical aftercare following surgery on the circulatory system: Secondary | ICD-10-CM | POA: Diagnosis not present

## 2014-04-13 DIAGNOSIS — Z9981 Dependence on supplemental oxygen: Secondary | ICD-10-CM | POA: Diagnosis not present

## 2014-04-13 DIAGNOSIS — N189 Chronic kidney disease, unspecified: Secondary | ICD-10-CM | POA: Diagnosis not present

## 2014-04-16 ENCOUNTER — Telehealth (HOSPITAL_COMMUNITY): Payer: Self-pay | Admitting: *Deleted

## 2014-04-16 DIAGNOSIS — E538 Deficiency of other specified B group vitamins: Secondary | ICD-10-CM | POA: Diagnosis not present

## 2014-04-16 DIAGNOSIS — Z9981 Dependence on supplemental oxygen: Secondary | ICD-10-CM | POA: Diagnosis not present

## 2014-04-16 DIAGNOSIS — Z951 Presence of aortocoronary bypass graft: Secondary | ICD-10-CM | POA: Diagnosis not present

## 2014-04-16 DIAGNOSIS — Z48812 Encounter for surgical aftercare following surgery on the circulatory system: Secondary | ICD-10-CM | POA: Diagnosis not present

## 2014-04-16 DIAGNOSIS — N189 Chronic kidney disease, unspecified: Secondary | ICD-10-CM | POA: Diagnosis not present

## 2014-04-16 DIAGNOSIS — E119 Type 2 diabetes mellitus without complications: Secondary | ICD-10-CM | POA: Diagnosis not present

## 2014-04-16 DIAGNOSIS — I2510 Atherosclerotic heart disease of native coronary artery without angina pectoris: Secondary | ICD-10-CM | POA: Diagnosis not present

## 2014-04-16 DIAGNOSIS — F17210 Nicotine dependence, cigarettes, uncomplicated: Secondary | ICD-10-CM | POA: Diagnosis not present

## 2014-04-16 DIAGNOSIS — I129 Hypertensive chronic kidney disease with stage 1 through stage 4 chronic kidney disease, or unspecified chronic kidney disease: Secondary | ICD-10-CM | POA: Diagnosis not present

## 2014-04-16 NOTE — Telephone Encounter (Signed)
Received signed MD order.  Message left for pt inquiring of his well being and to sign up for cardiac rehab.  Contact information provided. Cherre Huger, BSN

## 2014-04-19 ENCOUNTER — Encounter: Payer: Self-pay | Admitting: Physician Assistant

## 2014-04-19 NOTE — Progress Notes (Signed)
Cardiology Office Note   Date:  04/20/2014   ID:  Shane [name], DOB 12/19/1950, MRN [id number]  PCP:  Renato Shin, MD  Cardiologist:  Dr. [name]      Chief Complaint  Patient presents with  . Coronary Artery Disease    s/p CABG  . Hospitalization Follow-up     History of Present Illness: Shane [name] is a 64 y.o. male with a hx of diabetes, HTN, HL, tobacco abuse. He was referred by his PCP recently for dyspnea with exertion. Exercise treadmill test was borderline abnormal. Myoview was obtained and this demonstrated inferior and inferoapical ischemia, EF 48%. Cardiac catheterization was arranged and demonstrated 3 vessel CAD. Patient was referred for CABG. He was admitted 3/14-3/20 and underwent CABG 4 with Dr. Lucianne Lei Trigt (LIMA-LAD, SVG-DX, SVG-OM1, SVG-RCA). Echocardiogram prior to bypass demonstrated an EF of 50-55%. Postoperative course was notable for ileus. He had worsening Creatinine as well (peak Cr 2.01) that improved by DC (1.34).  Otherwise, he remained in normal sinus rhythm. He returns for follow-up.   He is overall doing well.  He is walking daily (20-25 mins).  Denies significant DOE.  Chest is still sore.  No syncope.  No orthopnea, PND, edema.  Still has a cough.  He quit smoking prior to his CABG.  Cough is productive of greenish sputum.  This has been present for some time now. No hemoptysis.  No fevers.    Studies/Reports Reviewed Today:  Cardiac cath 03/18/14 LM: No significant stenosis. LAD:  At origin of D1 diffuse 80-90% stenosis with a plaque ulceration. D1 90% stenosis.  LCx: Proximal 75% then long 50% RCA: Mid occluded EF: 55%  Echocardiogram 03/20/14 EF 50% to 55%. Grade 1 diastolic dysfunction.  Carotid US 03/20/14 Bilateral - 1% to 39% ICA stenosis.  Myoview 03/11/14 Intermediate risk stress nuclear study with a moderate-sized and intensity, reversible inferior and inferoapical perfusion defect suggestive of ischemia.  LV Ejection Fraction:  48%.  LV Wall Motion:  Inferior hypokinesis    Past Medical History  Diagnosis Date  . DIABETES MELLITUS, TYPE II 09/25/2006  . HYPERCHOLESTEROLEMIA   . HYPERTENSION   . PSA, INCREASED 11/30/2009  . RENAL INSUFFICIENCY 09/25/2006  . INSOMNIA 09/10/2008  . Colon polyps 06/17/2011  . Diverticulosis 06/17/2011  . CIGARETTE SMOKER 11/30/2009    Qualifier: Diagnosis of  By: Loanne Drilling MD, Jacelyn Pi   . Hx of cardiovascular stress test     Lexiscan Myoview 3/16:  Inferior ischemia, EF 48%, Intermediate Risk  . Hx of echocardiogram     a.  Echo 3/16:  EF 50-55%, Gr 1 DD  . Wears glasses   . Tinnitus   . Coronary artery disease     a. Myoview 3/16:  Inf ischemia, EF 48%, int risk;  b.  LHC 3/16:  3 v CAD,  c.  s/p CABG x 4 03/2014  . Asthma     PMH:as a child  . GERD (gastroesophageal reflux disease)   . Carotid stenosis     a. Carotid US 3/16:  bilat ICA 1-39%    Past Surgical History  Procedure Laterality Date  . Hernia repair    . Tonsillectomy    . Colonoscopy    . Polypectomy    . Left heart catheterization with coronary angiogram N/A 03/18/2014    Procedure: LEFT HEART CATHETERIZATION WITH CORONARY ANGIOGRAM;  Surgeon: [name], MD;  Location: [facility] CATH LAB;  Service: Cardiovascular;  Laterality: N/A;  . Cataract extraction  w/ intraocular lens  implant, bilateral    . Tee without cardioversion N/A 03/23/2014    Procedure: TRANSESOPHAGEAL ECHOCARDIOGRAM (TEE);  Surgeon: Ivin Poot, MD;  Location: [place];  Service: Open Heart Surgery;  Laterality: N/A;  . Coronary artery bypass graft N/A 03/23/2014    Procedure: CORONARY ARTERY BYPASS GRAFTING (CABG), ON PUMP, TIMES FOUR, USING LEFT INTERNAL MAMMARY ARTERY, BILATERAL GREATER SAPHENOUS VEINS HARVESTED ENDOSCOPICALLY;  Surgeon: Ivin Poot, MD;  Location: [place];  Service: Open Heart Surgery;  Laterality: N/A;     Current Outpatient Prescriptions  Medication Sig Dispense Refill  . aspirin EC 325 MG EC tablet Take 1 tablet (325 mg  total) by mouth daily. 30 tablet 0  . atorvastatin (LIPITOR) 80 MG tablet Take 1 tablet (80 mg total) by mouth daily. 90 tablet 3  . budesonide-formoterol (SYMBICORT) 160-4.5 MCG/ACT inhaler Inhale 2 puffs into the lungs 2 (two) times daily. 1 Inhaler 0  . ferrous [id number] C-folic acid (TRINSICON / FOLTRIN) capsule Take 1 capsule by mouth daily. For one month then stop. 30 capsule 0  . hydrALAZINE (APRESOLINE) 10 MG tablet Take 1 tablet (10 mg total) by mouth every 8 (eight) hours. 90 tablet 1  . metFORMIN (GLUCOPHAGE-XR) 500 MG 24 hr tablet TAKE ONE TABLET BY MOUTH ONCE DAILY 90 tablet 0  . metoprolol (LOPRESSOR) 50 MG tablet Take 1 tablet (50 mg total) by mouth 2 (two) times daily. 60 tablet 1  . omeprazole (PRILOSEC) 20 MG capsule Take 1 capsule (20 mg total) by mouth daily. 90 capsule 3  . traZODone (DESYREL) 150 MG tablet TAKE ONE TABLET BY MOUTH AT BEDTIME (Patient taking differently: Take 150 mg by mouth at bedtime as needed for sleep. TAKE ONE TABLET BY MOUTH AT BEDTIME PRN) 90 tablet 2   No current facility-administered medications for this visit.    Allergies:   Review of patient's allergies indicates no known allergies.    Social History:  The patient  reports that he quit smoking about 4 weeks ago. His smoking use included Cigarettes. He smoked 0.00 packs per day for 40 years. He has never used smokeless tobacco. He reports that he drinks about 7.2 oz of alcohol per week. He reports that he does not use illicit drugs.   Family History:  The patient's family history includes Arrhythmia in his sister; Cancer in his father; Heart attack in his maternal grandmother; Heart disease in his mother, sister, and sister; Hypertension in his father; Kidney failure in his sister. There is no history of Stroke.    ROS:   Please see the history of present illness.   Review of Systems  All other systems reviewed and are negative.    PHYSICAL EXAM: VS:  BP 129/55 mmHg  Pulse 78   Ht 5\' 11"  (1.803 m)  Wt 244 lb (110.678 kg)  BMI 34.05 kg/m2    Wt Readings from Last 3 Encounters:  04/20/14 244 lb (110.678 kg)  03/29/14 148 lb 3.2 oz (67.223 kg)  03/19/14 253 lb (114.76 kg)     GEN: Well nourished, well developed, in no acute distress HEENT: normal Neck: no JVD, no masses Cardiac:  Normal S1/S2, RRR; no murmur ,  no rubs or gallops, no edema  Chest:  Median sternotomy scar well healed without erythema or d/c. Respiratory:  clear to auscultation bilaterally, no wheezing, rhonchi or rales. GI: soft, nontender, nondistended, + BS MS: no deformity or atrophy Skin: warm and dry  Neuro:  CNs II-XII intact,  Strength and sensation are intact Psych: Normal affect   EKG:  EKG is ordered today.  It demonstrates:   NSR, HR 78, increased artifact, NSSTTW changes   Recent Labs: 02/10/2014: TSH 1.58 03/24/2014: Magnesium 2.2 03/27/2014: ALT 20; Hemoglobin 8.7*; Platelets 205 03/28/2014: BUN 25*; Creatinine 1.34; Potassium 4.1; Sodium 138    Lipid Panel    Component Value Date/Time   CHOL 126 02/10/2014 0917   TRIG 79.0 02/10/2014 0917   HDL 36.30* 02/10/2014 0917   CHOLHDL 3 02/10/2014 0917   VLDL 15.8 02/10/2014 0917   LDLCALC 74 02/10/2014 0917   LDLDIRECT 184.4 02/10/2013 0959      ASSESSMENT AND PLAN:  Coronary artery disease s/p CABG He is doing well after recent bypass surgery. We discussed the importance of cardiac rehabilitation. He is not certain he can do this. He sees Dr. Prescott Gum next week. He has not been wearing his oxygen. We will check his ambulatory oxygen today. Continue aspirin, statin, beta blocker. Given his CAD and diabetes as well as chronic kidney disease, he would benefit from ACE inhibitor therapy. Obtain basic metabolic panel today. If his creatinine is improved/stable, I will plan on stopping his hydralazine and starting lisinopril with close follow-up on renal function and potassium.  Essential hypertension Blood pressure is  controlled. Obtain labs today and adjust medications as outlined above.  Hyperlipidemia Continue statin.  Carotid stenosis, bilateral Will likely need to plan on repeat Dopplers in 1-2 years.  CKD (chronic kidney disease), unspecified stage Check basic metabolic panel today.  Tobacco abuse  He has quit smoking.  Current medicines are reviewed at length with the patient today.  The patient does not have concerns regarding medicines.  The following changes have been made:  As above.   Labs/ tests ordered today include:   Orders Placed This Encounter  Procedures  . Basic Metabolic Panel (BMET)  . EKG 12-Lead    Disposition:   FU with Dr. [name] 6-8 weeks.    Signed, Versie Starks, MHS 04/20/2014 9:20 AM    [facility] [address], [place], [place]  [address] Phone: [phone]; Fax: [phone]

## 2014-04-20 ENCOUNTER — Telehealth: Payer: Self-pay | Admitting: *Deleted

## 2014-04-20 ENCOUNTER — Ambulatory Visit (INDEPENDENT_AMBULATORY_CARE_PROVIDER_SITE_OTHER): Payer: Commercial Managed Care - HMO | Admitting: Physician Assistant

## 2014-04-20 ENCOUNTER — Encounter: Payer: Self-pay | Admitting: Physician Assistant

## 2014-04-20 VITALS — BP 129/55 | HR 78 | Ht 71.0 in | Wt 244.0 lb

## 2014-04-20 DIAGNOSIS — I6523 Occlusion and stenosis of bilateral carotid arteries: Secondary | ICD-10-CM

## 2014-04-20 DIAGNOSIS — N189 Chronic kidney disease, unspecified: Secondary | ICD-10-CM

## 2014-04-20 DIAGNOSIS — I10 Essential (primary) hypertension: Secondary | ICD-10-CM

## 2014-04-20 DIAGNOSIS — Z720 Tobacco use: Secondary | ICD-10-CM

## 2014-04-20 DIAGNOSIS — I2510 Atherosclerotic heart disease of native coronary artery without angina pectoris: Secondary | ICD-10-CM | POA: Diagnosis not present

## 2014-04-20 DIAGNOSIS — E785 Hyperlipidemia, unspecified: Secondary | ICD-10-CM

## 2014-04-20 LAB — BASIC METABOLIC PANEL
BUN: 9 mg/dL (ref 6–23)
CALCIUM: 9.3 mg/dL (ref 8.4–10.5)
CHLORIDE: 104 meq/L (ref 96–112)
CO2: 30 mEq/L (ref 19–32)
CREATININE: 0.97 mg/dL (ref 0.40–1.50)
GFR: 99.54 mL/min (ref >60.00)
Glucose, Bld: 82 mg/dL (ref 70–99)
Potassium: 3.9 mEq/L (ref 3.5–5.1)
Sodium: 140 mEq/L (ref 135–145)

## 2014-04-20 MED ORDER — LISINOPRIL 10 MG PO TABS
10.0000 mg | ORAL_TABLET | Freq: Every day | ORAL | Status: DC
Start: 2014-04-20 — End: 2015-04-11

## 2014-04-20 MED ORDER — ASPIRIN EC 81 MG PO TBEC: 81.0000 mg | DELAYED_RELEASE_TABLET | Freq: Every day | ORAL | Status: AC

## 2014-04-20 NOTE — Patient Instructions (Addendum)
LAB WORK TODAY; BMET  FOLLOW UP WITH DR. Burt Knack IN 6-8 WEEKS  FINISH ASPIRIN 325 MG BOTTLE; ONCE FINISHED YOU WILL CHANGE OVER TO THE ASPIRIN 81 MG DAILY  PER SCOTT WEAVER, PAC YOU WILL NEED TO RESUME O2 DUE TO WALKING YOU IN OUR OFFICE TODAY SHOWED YOUR O2 LEVEL DROPPED FROM SITTING AT 93% TO WALKING TO 82%. YOU HAVE BEEN ADVISED TO FOLLOW UP WITH THE SURGEON WHO ORDERED O2 TO LET THEM KNOW OF TODAY'S FINDINGS.

## 2014-04-20 NOTE — Telephone Encounter (Signed)
Called pt with lab results and he asked for me to give results and med changes to his wife. Wife notified of results, have pt d/c hydralazine, start lisinopril 10 mg daily, bmet 4/20. Wife and pt both vebalized understanding to plan of care.

## 2014-04-21 ENCOUNTER — Other Ambulatory Visit: Payer: Self-pay

## 2014-04-21 MED ORDER — METFORMIN HCL ER 500 MG PO TB24: 500.0000 mg | ORAL_TABLET | Freq: Every day | ORAL | Status: DC

## 2014-04-22 DIAGNOSIS — Z951 Presence of aortocoronary bypass graft: Secondary | ICD-10-CM | POA: Diagnosis not present

## 2014-04-22 DIAGNOSIS — E119 Type 2 diabetes mellitus without complications: Secondary | ICD-10-CM | POA: Diagnosis not present

## 2014-04-22 DIAGNOSIS — F17210 Nicotine dependence, cigarettes, uncomplicated: Secondary | ICD-10-CM | POA: Diagnosis not present

## 2014-04-22 DIAGNOSIS — I129 Hypertensive chronic kidney disease with stage 1 through stage 4 chronic kidney disease, or unspecified chronic kidney disease: Secondary | ICD-10-CM | POA: Diagnosis not present

## 2014-04-22 DIAGNOSIS — E538 Deficiency of other specified B group vitamins: Secondary | ICD-10-CM | POA: Diagnosis not present

## 2014-04-22 DIAGNOSIS — Z9981 Dependence on supplemental oxygen: Secondary | ICD-10-CM | POA: Diagnosis not present

## 2014-04-22 DIAGNOSIS — I2510 Atherosclerotic heart disease of native coronary artery without angina pectoris: Secondary | ICD-10-CM | POA: Diagnosis not present

## 2014-04-22 DIAGNOSIS — N189 Chronic kidney disease, unspecified: Secondary | ICD-10-CM | POA: Diagnosis not present

## 2014-04-22 DIAGNOSIS — Z48812 Encounter for surgical aftercare following surgery on the circulatory system: Secondary | ICD-10-CM | POA: Diagnosis not present

## 2014-04-23 ENCOUNTER — Telehealth (HOSPITAL_COMMUNITY): Payer: Self-pay | Admitting: *Deleted

## 2014-04-23 NOTE — Telephone Encounter (Signed)
Pt contacted for second attempt.  Pt just finished up with physical therapy in the home.  Pt is on a "program" for his exercise with walking and plans to join the [employer].  Pt prefers to continue this verses incurring additional bills to pay.  Pt has a copay for cardiac rehab. Cherre Huger, BSN

## 2014-04-29 ENCOUNTER — Ambulatory Visit: Payer: Commercial Managed Care - HMO | Admitting: Cardiothoracic Surgery

## 2014-04-29 ENCOUNTER — Other Ambulatory Visit: Payer: Commercial Managed Care - HMO

## 2014-04-29 DIAGNOSIS — R0902 Hypoxemia: Secondary | ICD-10-CM | POA: Diagnosis not present

## 2014-04-29 DIAGNOSIS — R0602 Shortness of breath: Secondary | ICD-10-CM | POA: Diagnosis not present

## 2014-04-30 ENCOUNTER — Other Ambulatory Visit: Payer: Self-pay | Admitting: Cardiothoracic Surgery

## 2014-04-30 DIAGNOSIS — Z951 Presence of aortocoronary bypass graft: Secondary | ICD-10-CM

## 2014-05-04 ENCOUNTER — Other Ambulatory Visit (INDEPENDENT_AMBULATORY_CARE_PROVIDER_SITE_OTHER): Payer: Commercial Managed Care - HMO | Admitting: *Deleted

## 2014-05-04 ENCOUNTER — Ambulatory Visit (INDEPENDENT_AMBULATORY_CARE_PROVIDER_SITE_OTHER): Payer: Self-pay | Admitting: Surgical

## 2014-05-04 ENCOUNTER — Ambulatory Visit
Admission: RE | Admit: 2014-05-04 | Discharge: 2014-05-04 | Disposition: A | Discharge status: Home / Self Care | Payer: Commercial Managed Care - HMO | Source: Ambulatory Visit | Attending: Cardiothoracic Surgery | Admitting: Cardiothoracic Surgery

## 2014-05-04 VITALS — BP 123/77 | HR 85 | Resp 20 | Ht 71.0 in

## 2014-05-04 DIAGNOSIS — I517 Cardiomegaly: Secondary | ICD-10-CM | POA: Diagnosis not present

## 2014-05-04 DIAGNOSIS — Z951 Presence of aortocoronary bypass graft: Secondary | ICD-10-CM

## 2014-05-04 DIAGNOSIS — I10 Essential (primary) hypertension: Secondary | ICD-10-CM

## 2014-05-04 DIAGNOSIS — J984 Other disorders of lung: Secondary | ICD-10-CM | POA: Diagnosis not present

## 2014-05-04 DIAGNOSIS — I2510 Atherosclerotic heart disease of native coronary artery without angina pectoris: Secondary | ICD-10-CM

## 2014-05-04 LAB — BASIC METABOLIC PANEL
BUN: 11 mg/dL (ref 6–23)
CO2: 28 mEq/L (ref 19–32)
Calcium: 9.1 mg/dL (ref 8.4–10.5)
Chloride: 103 mEq/L (ref 96–112)
Creatinine, Ser: 1.05 mg/dL (ref 0.40–1.50)
GFR: 90.83 mL/min (ref >60.00)
Glucose, Bld: 86 mg/dL (ref 70–99)
POTASSIUM: 3.7 meq/L (ref 3.5–5.1)
Sodium: 137 mEq/L (ref 135–145)

## 2014-05-04 NOTE — Progress Notes (Signed)
[address]       ,[place] [address]             [phone]                  [address] Medical Record #[id number] Date of Birth: Mar 16, 1950  Referring DD:[id number], Legrand Como, MD Primary Cardiology: Primary Care:ELLISON, Hilliard Clark, MD  Chief Complaint:  Follow Up Visit DATE OF PROCEDURE: 03/23/2014 DATE OF DISCHARGE:   OPERATIVE REPORT   OPERATIONS: 1. Coronary artery bypass grafting x4 (left internal mammary artery to  left anterior descending artery, saphenous vein graft to diagonal,  saphenous vein graft to circumflex marginal, saphenous vein graft  to right ventricular marginal). 2. Endoscopic harvest of bilateral greater saphenous veins.  SURGEON: Ivin Poot, M.D.  ASSISTANT: Ellwood Handler, PA-C.  ANESTHESIA: General.  History of Present Illness:    64 year old male s/p above procedure. He is doing quite well without significant complaints. He is no longer using O2. His bowels are functioning normally. He has only mild soreness and is tolerating activities including driving.      Zubrod Score: At the time of surgery this patient's most appropriate activity status/level should be described as: [x]     0    Normal activity, no symptoms []     1    Restricted in physical strenuous activity but ambulatory, able to do out light work []     2    Ambulatory and capable of self care, unable to do work activities, up and about                 >50 % of waking hours                                                                                   []     3    Only limited self care, in bed greater than 50% of waking hours []     4    Completely disabled, no self care, confined to bed or chair []     5    Moribund  History  Smoking status  . Former Smoker -- 0.00 packs/day for 40 years  . Types: Cigarettes  . Quit date: 03/17/2014  Smokeless tobacco  . Never Used    Comment: I use e cigarettes  now 03/20/14       No Known Allergies  Current Outpatient Prescriptions  Medication Sig Dispense Refill  . aspirin 81 MG tablet Take 1 tablet (81 mg total) by mouth daily.    [name] Kitchen atorvastatin (LIPITOR) 80 MG tablet Take 1 tablet (80 mg total) by mouth daily. 90 tablet 3  . hydrALAZINE (APRESOLINE) 10 MG tablet     . lisinopril (PRINIVIL,ZESTRIL) 10 MG tablet Take 1 tablet (10 mg total) by mouth daily. 30 tablet 11  . metFORMIN (GLUCOPHAGE-XR) 500 MG 24 hr tablet Take 1 tablet (500 mg total) by mouth daily. 90 tablet 0  . metoprolol (LOPRESSOR) 50 MG tablet Take 1 tablet (50 mg total) by mouth 2 (two) times daily. 60 tablet 1  . omeprazole (PRILOSEC) 20 MG capsule Take 1 capsule (20  mg total) by mouth daily. 90 capsule 3   No current facility-administered medications for this visit.       Physical Exam: BP 123/77 mmHg  Pulse 85  Resp 20  Ht 5\' 11"  (1.803 m)  SpO2 97%  General appearance: alert, cooperative and no distress Heart: regular rate and rhythm, S1, S2 normal, no murmur, click, rub or gallop Lungs: clear to auscultation bilaterally Abdomen: soft, non-tender; bowel sounds normal; no masses,  no organomegaly Extremities: min right ankle edema Wounds:incis all well healed   Diagnostic Studies & Laboratory data:         Recent Radiology Findings: Dg Chest 2 View  05/04/2014   CLINICAL DATA:  CABG.  Swelling and ankles.  EXAM: CHEST  2 VIEW  COMPARISON:  03/27/2014.  FINDINGS: Mediastinum hilar structures are normal. Prior CABG. Stable cardiomegaly. No pulmonary venous congestion. Changes of pleural parenchymal thickening noted about the left lung. Previously identified atelectasis left upper lobe is clear.  IMPRESSION: 1. Prior CABG. Stable cardiomegaly. No pulmonary venous congestion or evidence of congestive heart failure. 2. Interim clearing of left upper lobe subsegmental atelectasis. Mild pleural parenchymal scarring. No acute abnormality.   Electronically Signed   By:  Marcello Moores  Register   On: 05/04/2014 14:30      I have independently reviewed the above radiology findings and reviewed findings  with the patient.  Recent Labs: Lab Results  Component Value Date   WBC 10.4 03/27/2014   HGB 8.7* 03/27/2014   HCT 26.5* 03/27/2014   PLT 205 03/27/2014   GLUCOSE 82 04/20/2014   CHOL 126 02/10/2014   TRIG 79.0 02/10/2014   HDL 36.30* 02/10/2014   LDLDIRECT 184.4 02/10/2013   LDLCALC 74 02/10/2014   ALT 20 03/27/2014   AST 23 03/27/2014   NA 140 04/20/2014   K 3.9 04/20/2014   CL 104 04/20/2014   CREATININE 0.97 04/20/2014   BUN 9 04/20/2014   CO2 30 04/20/2014   TSH 1.58 02/10/2014   INR 1.37 03/23/2014   HGBA1C 5.9* 03/20/2014      Assessment / Plan:  Doing well Cont to have routine lifting and activity progression as per routine post CABG protocols F/U prn for any surgically related issues or at request.  He has seen cardiology and they are adjusting BP meds         Ericca Labra E 05/04/2014 2:56 PM

## 2014-05-04 NOTE — Patient Instructions (Signed)
Given further verbal instructions on activity progression and restrictions

## 2014-05-05 DIAGNOSIS — I129 Hypertensive chronic kidney disease with stage 1 through stage 4 chronic kidney disease, or unspecified chronic kidney disease: Secondary | ICD-10-CM | POA: Diagnosis not present

## 2014-05-05 DIAGNOSIS — Z951 Presence of aortocoronary bypass graft: Secondary | ICD-10-CM | POA: Diagnosis not present

## 2014-05-05 DIAGNOSIS — I2510 Atherosclerotic heart disease of native coronary artery without angina pectoris: Secondary | ICD-10-CM | POA: Diagnosis not present

## 2014-05-05 DIAGNOSIS — E119 Type 2 diabetes mellitus without complications: Secondary | ICD-10-CM | POA: Diagnosis not present

## 2014-05-05 DIAGNOSIS — E538 Deficiency of other specified B group vitamins: Secondary | ICD-10-CM | POA: Diagnosis not present

## 2014-05-05 DIAGNOSIS — Z48812 Encounter for surgical aftercare following surgery on the circulatory system: Secondary | ICD-10-CM | POA: Diagnosis not present

## 2014-05-05 DIAGNOSIS — N189 Chronic kidney disease, unspecified: Secondary | ICD-10-CM | POA: Diagnosis not present

## 2014-05-05 DIAGNOSIS — Z9981 Dependence on supplemental oxygen: Secondary | ICD-10-CM | POA: Diagnosis not present

## 2014-05-05 DIAGNOSIS — F17210 Nicotine dependence, cigarettes, uncomplicated: Secondary | ICD-10-CM | POA: Diagnosis not present

## 2014-05-28 ENCOUNTER — Encounter: Payer: Self-pay | Admitting: Gastroenterology

## 2014-05-28 DIAGNOSIS — I129 Hypertensive chronic kidney disease with stage 1 through stage 4 chronic kidney disease, or unspecified chronic kidney disease: Secondary | ICD-10-CM | POA: Diagnosis not present

## 2014-05-28 DIAGNOSIS — E119 Type 2 diabetes mellitus without complications: Secondary | ICD-10-CM | POA: Diagnosis not present

## 2014-05-28 DIAGNOSIS — I2510 Atherosclerotic heart disease of native coronary artery without angina pectoris: Secondary | ICD-10-CM | POA: Diagnosis not present

## 2014-05-28 DIAGNOSIS — F17210 Nicotine dependence, cigarettes, uncomplicated: Secondary | ICD-10-CM | POA: Diagnosis not present

## 2014-05-28 DIAGNOSIS — Z9981 Dependence on supplemental oxygen: Secondary | ICD-10-CM | POA: Diagnosis not present

## 2014-05-28 DIAGNOSIS — Z48812 Encounter for surgical aftercare following surgery on the circulatory system: Secondary | ICD-10-CM | POA: Diagnosis not present

## 2014-05-28 DIAGNOSIS — E538 Deficiency of other specified B group vitamins: Secondary | ICD-10-CM | POA: Diagnosis not present

## 2014-05-28 DIAGNOSIS — N189 Chronic kidney disease, unspecified: Secondary | ICD-10-CM | POA: Diagnosis not present

## 2014-05-28 DIAGNOSIS — Z951 Presence of aortocoronary bypass graft: Secondary | ICD-10-CM | POA: Diagnosis not present

## 2014-06-10 ENCOUNTER — Encounter: Payer: Self-pay | Admitting: Physician Assistant

## 2014-06-10 ENCOUNTER — Ambulatory Visit (INDEPENDENT_AMBULATORY_CARE_PROVIDER_SITE_OTHER): Payer: Commercial Managed Care - HMO | Admitting: Physician Assistant

## 2014-06-10 VITALS — BP 128/68 | HR 76 | Ht 71.0 in | Wt 244.0 lb

## 2014-06-10 DIAGNOSIS — I2510 Atherosclerotic heart disease of native coronary artery without angina pectoris: Secondary | ICD-10-CM | POA: Diagnosis not present

## 2014-06-10 DIAGNOSIS — N189 Chronic kidney disease, unspecified: Secondary | ICD-10-CM

## 2014-06-10 DIAGNOSIS — I10 Essential (primary) hypertension: Secondary | ICD-10-CM

## 2014-06-10 DIAGNOSIS — E785 Hyperlipidemia, unspecified: Secondary | ICD-10-CM

## 2014-06-10 DIAGNOSIS — I6523 Occlusion and stenosis of bilateral carotid arteries: Secondary | ICD-10-CM | POA: Diagnosis not present

## 2014-06-10 MED ORDER — METOPROLOL TARTRATE 50 MG PO TABS: 50.0000 mg | ORAL_TABLET | Freq: Two times a day (BID) | ORAL | Status: DC

## 2014-06-10 NOTE — Addendum Note (Signed)
Addended by: Michae Kava on: 06/10/2014 04:21 PM   Modules accepted: Orders, Medications

## 2014-06-10 NOTE — Patient Instructions (Signed)
Medication Instructions:  Your physician recommends that you continue on your current medications as directed. Please refer to the Current Medication list given to you today.   Labwork: NONE  Testing/Procedures: NONE  Follow-Up: 6 MONTHS WITH DR. Burt Knack; WE WILL SEND OUT A REMINDER LETTER  Any Other Special Instructions Will Be Listed Below (If Applicable).

## 2014-06-10 NOTE — Progress Notes (Signed)
Cardiology Office Note   Date:  06/10/2014   ID:  Shane [name], DOB [birth year], MRN [id number]  PCP:  Shane Shin, MD  Cardiologist:  Dr. [name]      Chief Complaint  Patient presents with  . Coronary Artery Disease     History of Present Illness: Shane [name] is a 64 y.o. male with a hx of diabetes, HTN, HL, tobacco abuse. He was referred by his PCP in 03/2014 for dyspnea with exertion. Exercise treadmill test was borderline abnormal. Myoview was obtained and this demonstrated inferior and inferoapical ischemia, EF 48%. Cardiac catheterization demonstrated 3 vessel CAD. Patient underwent CABG 4 with Dr. Lucianne Lei Trigt (LIMA-LAD, SVG-DX, SVG-OM1, SVG-RCA). Echocardiogram prior to bypass demonstrated an EF of 50-55%.  I saw him in FU 04/20/14.  He returns for FU.  He is doing well. Chest remains minimally sore. He denies significant dyspnea. He has an occasional cough. He still sleeps on 2 pillows. He denies PND. He has mild pedal edema without significant change. He denies syncope.  He is back at work.  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    Studies/Reports Reviewed Today:  Cardiac cath 03/18/14 LM: No significant stenosis. LAD:  At origin of D1 diffuse 80-90% stenosis with a plaque ulceration. D1 90% stenosis.  LCx: Proximal 75% then long 50% RCA: Mid occluded EF: 55%  Echocardiogram 03/20/14 EF 50% to 55%. Grade 1 diastolic dysfunction.  Carotid US 03/20/14 Bilateral - 1% to 39% ICA stenosis.  Myoview 03/11/14 Intermediate risk stress nuclear study with a moderate-sized and intensity, reversible inferior and inferoapical perfusion defect suggestive of ischemia.  LV Ejection Fraction: 48%.  LV Wall Motion:  Inferior hypokinesis    Past Medical History  Diagnosis Date  . DIABETES MELLITUS, TYPE II 09/25/2006  . HYPERCHOLESTEROLEMIA   . HYPERTENSION   . PSA, INCREASED 11/30/2009  . RENAL INSUFFICIENCY 09/25/2006  . INSOMNIA 09/10/2008  . Colon polyps 06/17/2011  .  Diverticulosis 06/17/2011  . CIGARETTE SMOKER 11/30/2009    Qualifier: Diagnosis of  By: Loanne Drilling MD, Jacelyn Pi   . Hx of cardiovascular stress test     Lexiscan Myoview 3/16:  Inferior ischemia, EF 48%, Intermediate Risk  . Hx of echocardiogram     a.  Echo 3/16:  EF 50-55%, Gr 1 DD  . Wears glasses   . Tinnitus   . Coronary artery disease     a. Myoview 3/16:  Inf ischemia, EF 48%, int risk;  b.  LHC 3/16:  3 v CAD,  c.  s/p CABG x 4 03/2014  . Asthma     PMH:as a child  . GERD (gastroesophageal reflux disease)   . Carotid stenosis     a. Carotid US 3/16:  bilat ICA 1-39%    Past Surgical History  Procedure Laterality Date  . Hernia repair    . Tonsillectomy    . Colonoscopy    . Polypectomy    . Left heart catheterization with coronary angiogram N/A 03/18/2014    Procedure: LEFT HEART CATHETERIZATION WITH CORONARY ANGIOGRAM;  Surgeon: [name], MD;  Location: [facility] CATH LAB;  Service: Cardiovascular;  Laterality: N/A;  . Cataract extraction w/ intraocular lens  implant, bilateral    . Tee without cardioversion N/A 03/23/2014    Procedure: TRANSESOPHAGEAL ECHOCARDIOGRAM (TEE);  Surgeon: Ivin Poot, MD;  Location: [place];  Service: Open Heart Surgery;  Laterality: N/A;  . Coronary artery bypass graft N/A 03/23/2014    Procedure: CORONARY  ARTERY BYPASS GRAFTING (CABG), ON PUMP, TIMES FOUR, USING LEFT INTERNAL MAMMARY ARTERY, BILATERAL GREATER SAPHENOUS VEINS HARVESTED ENDOSCOPICALLY;  Surgeon: Ivin Poot, MD;  Location: [place];  Service: Open Heart Surgery;  Laterality: N/A;     Current Outpatient Prescriptions  Medication Sig Dispense Refill  . aspirin 81 MG tablet Take 1 tablet (81 mg total) by mouth daily.    [name] Kitchen atorvastatin (LIPITOR) 80 MG tablet Take 1 tablet (80 mg total) by mouth daily. 90 tablet 3  . lisinopril (PRINIVIL,ZESTRIL) 10 MG tablet Take 1 tablet (10 mg total) by mouth daily. 30 tablet 11  . metFORMIN (GLUCOPHAGE-XR) 500 MG 24 hr tablet Take 1 tablet (500 mg  total) by mouth daily. 90 tablet 0  . metoprolol (LOPRESSOR) 50 MG tablet Take 1 tablet (50 mg total) by mouth 2 (two) times daily. 60 tablet 1  . omeprazole (PRILOSEC) 20 MG capsule Take 1 capsule (20 mg total) by mouth daily. 90 capsule 3  . hydrocortisone 1 % lotion 3 (three) times daily.     No current facility-administered medications for this visit.    Allergies:   Review of patient's allergies indicates no known allergies.    Social History:  The patient  reports that he quit smoking about 2 months ago. His smoking use included Cigarettes. He smoked 0.00 packs per day for 40 years. He has never used smokeless tobacco. He reports that he drinks about 7.2 oz of alcohol per week. He reports that he does not use illicit drugs.   Family History:  The patient's family history includes Arrhythmia in his sister; Cancer in his father; Heart attack in his maternal grandmother; Heart disease in his mother, sister, and sister; Hypertension in his father; Kidney failure in his sister. There is no history of Stroke.    ROS:   Please see the history of present illness.   Review of Systems  Musculoskeletal: Positive for joint pain.       Left shoulder, worse with positional changes  Gastrointestinal: Positive for hemorrhoids.  All other systems reviewed and are negative.    PHYSICAL EXAM: VS:  BP 128/68 mmHg  Pulse 76  Ht 5\' 11"  (1.803 m)  Wt 244 lb (110.678 kg)  BMI 34.05 kg/m2    Wt Readings from Last 3 Encounters:  06/10/14 244 lb (110.678 kg)  04/20/14 244 lb (110.678 kg)  03/29/14 148 lb 3.2 oz (67.223 kg)     GEN: Well nourished, well developed, in no acute distress HEENT: normal Neck: no JVD, no masses Cardiac:  Normal S1/S2, RRR; no murmur ,  no rubs or gallops, trace bilateral LE edema  Respiratory:  clear to auscultation bilaterally, no wheezing, rhonchi or rales. GI: soft, nontender, nondistended, + BS MS: no deformity or atrophy Skin: warm and dry  Neuro:  CNs II-XII  intact, Strength and sensation are intact Psych: Normal affect   EKG:  EKG is ordered today.  It demonstrates:   NSR, HR 76, nonspecific ST-T wave changes   Recent Labs: 02/10/2014: TSH 1.58 03/24/2014: Magnesium 2.2 03/27/2014: ALT 20; Hemoglobin 8.7*; Platelets 205 05/04/2014: BUN 11; Creatinine 1.05; Potassium 3.7; Sodium 137    Lipid Panel    Component Value Date/Time   CHOL 126 02/10/2014 0917   TRIG 79.0 02/10/2014 0917   HDL 36.30* 02/10/2014 0917   CHOLHDL 3 02/10/2014 0917   VLDL 15.8 02/10/2014 0917   LDLCALC 74 02/10/2014 0917   LDLDIRECT 184.4 02/10/2013 0959      ASSESSMENT AND  PLAN:  Coronary artery disease s/p CABG:  No angina.  He continues to exercise on his own. Continue aspirin, statin, beta blocker, ACE inhibitor.    Essential hypertension:  Controlled.   Hyperlipidemia:   Continue statin.  Managed by PCP.   Carotid stenosis, bilateral:  Will likely need to plan on repeat Dopplers in 1-2 years.  CKD (chronic kidney disease), unspecified stage:  Recent creatinine normal.  Tobacco abuse:  He has quit smoking.  Current medicines are reviewed at length with the patient today.  Any concerns are outlined above.  The following changes have been made:    None     Labs/ tests ordered today include:  Orders Placed This Encounter  Procedures  . EKG 12-Lead    Disposition:   FU Dr. [name] 6 mos.   Signed, Versie Starks, MHS 06/10/2014 4:11 PM    [facility] [place], [place], [place]  [address] Phone: [phone]; Fax: [phone]

## 2014-07-20 ENCOUNTER — Other Ambulatory Visit: Payer: Self-pay

## 2014-07-20 ENCOUNTER — Telehealth: Payer: Self-pay | Admitting: Endocrinology

## 2014-07-20 MED ORDER — METFORMIN HCL ER 500 MG PO TB24: 500.0000 mg | ORAL_TABLET | Freq: Every day | ORAL | Status: DC

## 2014-07-20 NOTE — Telephone Encounter (Signed)
Please call in metformin to [facility] on [address]

## 2014-08-13 ENCOUNTER — Encounter: Payer: Self-pay | Admitting: Gastroenterology

## 2014-08-17 ENCOUNTER — Encounter: Payer: Medicare HMO | Admitting: Endocrinology

## 2014-08-22 ENCOUNTER — Other Ambulatory Visit: Payer: Self-pay | Admitting: Endocrinology

## 2014-08-26 ENCOUNTER — Telehealth: Payer: Self-pay | Admitting: Endocrinology

## 2014-08-26 ENCOUNTER — Ambulatory Visit: Payer: Medicare HMO | Admitting: Endocrinology

## 2014-08-26 MED ORDER — OMEPRAZOLE 20 MG PO CPDR: 20.0000 mg | DELAYED_RELEASE_CAPSULE | Freq: Every day | ORAL | Status: DC

## 2014-08-26 NOTE — Telephone Encounter (Signed)
Pt had to cancel appt today due to work but still needs refill on omeprazole please call into [facility]

## 2014-08-26 NOTE — Telephone Encounter (Signed)
Rx sent per pt's request.  

## 2014-09-11 DIAGNOSIS — E119 Type 2 diabetes mellitus without complications: Secondary | ICD-10-CM | POA: Diagnosis not present

## 2014-09-11 DIAGNOSIS — H04123 Dry eye syndrome of bilateral lacrimal glands: Secondary | ICD-10-CM | POA: Diagnosis not present

## 2014-09-11 DIAGNOSIS — H524 Presbyopia: Secondary | ICD-10-CM | POA: Diagnosis not present

## 2014-09-11 DIAGNOSIS — H43392 Other vitreous opacities, left eye: Secondary | ICD-10-CM | POA: Diagnosis not present

## 2014-09-11 DIAGNOSIS — Z961 Presence of intraocular lens: Secondary | ICD-10-CM | POA: Diagnosis not present

## 2014-10-17 ENCOUNTER — Other Ambulatory Visit: Payer: Self-pay | Admitting: Endocrinology

## 2014-11-02 ENCOUNTER — Other Ambulatory Visit: Payer: Self-pay | Admitting: Endocrinology

## 2014-12-06 ENCOUNTER — Emergency Department (INDEPENDENT_AMBULATORY_CARE_PROVIDER_SITE_OTHER): Payer: Commercial Managed Care - HMO

## 2014-12-06 ENCOUNTER — Emergency Department (HOSPITAL_COMMUNITY)
Admission: EM | Admit: 2014-12-06 | Discharge: 2014-12-06 | Disposition: A | Discharge status: Home / Self Care | Payer: Commercial Managed Care - HMO | Source: Home / Self Care | Attending: Family Medicine | Admitting: Family Medicine

## 2014-12-06 ENCOUNTER — Encounter (HOSPITAL_COMMUNITY): Payer: Self-pay | Admitting: *Deleted

## 2014-12-06 DIAGNOSIS — S6992XA Unspecified injury of left wrist, hand and finger(s), initial encounter: Secondary | ICD-10-CM

## 2014-12-06 DIAGNOSIS — Z23 Encounter for immunization: Secondary | ICD-10-CM | POA: Diagnosis not present

## 2014-12-06 DIAGNOSIS — IMO0002 Reserved for concepts with insufficient information to code with codable children: Secondary | ICD-10-CM

## 2014-12-06 DIAGNOSIS — S61215A Laceration without foreign body of left ring finger without damage to nail, initial encounter: Secondary | ICD-10-CM | POA: Diagnosis not present

## 2014-12-06 MED ORDER — TETANUS-DIPHTH-ACELL PERTUSSIS 5-2.5-18.5 LF-MCG/0.5 IM SUSP
INTRAMUSCULAR | Status: AC
  Filled 2014-12-06: qty 0.5

## 2014-12-06 MED ORDER — TETANUS-DIPHTH-ACELL PERTUSSIS 5-2.5-18.5 LF-MCG/0.5 IM SUSP
0.5000 mL | Freq: Once | INTRAMUSCULAR | Status: AC
  Administered 2014-12-06: 0.5 mL via INTRAMUSCULAR

## 2014-12-06 NOTE — ED Notes (Signed)
Pt  Sustained  A  Laceration  To  The  Tip  Of  His  l  Ring  Finger   With  Some nail  Involvement       Pt  Sustained  The  Injury  While  Operating a  Router     Saw

## 2014-12-06 NOTE — Discharge Instructions (Signed)
Wash and use bacitracin and bandage as needed. You had a tetanus booster, return as needed.

## 2014-12-06 NOTE — ED Provider Notes (Signed)
CSN: [id number]     Arrival date & time 12/06/14  1853 History   First MD Initiated Contact with Patient 12/06/14 1906     Chief Complaint  Patient presents with  . Laceration   (Consider location/radiation/quality/duration/timing/severity/associated sxs/prior Treatment) Patient is a 64 y.o. male presenting with skin laceration. The history is provided by the patient and the spouse.  Laceration Location:  Finger Finger laceration location:  L ring finger Depth:  Cutaneous Quality: jagged   Bleeding: controlled   Time since incident:  1 hour Injury mechanism: got in hurry with router and lac to distal phal of lrf. Pain details:    Progression:  Unchanged Foreign body present:  No foreign bodies Relieved by:  None tried Worsened by:  Nothing tried Ineffective treatments:  None tried Tetanus status:  Out of date   Past Medical History  Diagnosis Date  . DIABETES MELLITUS, TYPE II 09/25/2006  . HYPERCHOLESTEROLEMIA   . HYPERTENSION   . PSA, INCREASED 11/30/2009  . RENAL INSUFFICIENCY 09/25/2006  . INSOMNIA 09/10/2008  . Colon polyps 06/17/2011  . Diverticulosis 06/17/2011  . CIGARETTE SMOKER 11/30/2009    Qualifier: Diagnosis of  By: Loanne Drilling MD, Jacelyn Pi   . Hx of cardiovascular stress test     Lexiscan Myoview 3/16:  Inferior ischemia, EF 48%, Intermediate Risk  . Hx of echocardiogram     a.  Echo 3/16:  EF 50-55%, Gr 1 DD  . Wears glasses   . Tinnitus   . Coronary artery disease     a. Myoview 3/16:  Inf ischemia, EF 48%, int risk;  b.  LHC 3/16:  3 v CAD,  c.  s/p CABG x 4 03/2014  . Asthma     PMH:as a child  . GERD (gastroesophageal reflux disease)   . Carotid stenosis     a. Carotid US 3/16:  bilat ICA 1-39%   Past Surgical History  Procedure Laterality Date  . Hernia repair    . Tonsillectomy    . Colonoscopy    . Polypectomy    . Left heart catheterization with coronary angiogram N/A 03/18/2014    Procedure: LEFT HEART CATHETERIZATION WITH CORONARY ANGIOGRAM;   Surgeon: [name], MD;  Location: [facility] CATH LAB;  Service: Cardiovascular;  Laterality: N/A;  . Cataract extraction w/ intraocular lens  implant, bilateral    . Tee without cardioversion N/A 03/23/2014    Procedure: TRANSESOPHAGEAL ECHOCARDIOGRAM (TEE);  Surgeon: Ivin Poot, MD;  Location: [place];  Service: Open Heart Surgery;  Laterality: N/A;  . Coronary artery bypass graft N/A 03/23/2014    Procedure: CORONARY ARTERY BYPASS GRAFTING (CABG), ON PUMP, TIMES FOUR, USING LEFT INTERNAL MAMMARY ARTERY, BILATERAL GREATER SAPHENOUS VEINS HARVESTED ENDOSCOPICALLY;  Surgeon: Ivin Poot, MD;  Location: [place];  Service: Open Heart Surgery;  Laterality: N/A;   Family History  Problem Relation Age of Onset  . Cancer Father   . Hypertension Father   . Heart disease Mother   . Heart disease Sister   . Kidney failure Sister   . Heart disease Sister   . Arrhythmia Sister   . Heart attack Maternal Grandmother   . Stroke Neg Hx    Social History  Substance Use Topics  . Smoking status: Former Smoker -- 0.00 packs/day for 40 years    Types: Cigarettes    Quit date: 03/17/2014  . Smokeless tobacco: Never Used     Comment: I use e cigarettes now 03/20/14  . Alcohol Use: 7.2  oz/week    12 Cans of beer per week     Comment: "I drink a quart of beer a day."    Review of Systems  Constitutional: Negative.   Musculoskeletal: Negative for joint swelling.  Skin: Positive for wound.  All other systems reviewed and are negative.   Allergies  Review of patient's allergies indicates no known allergies.  Home Medications   Prior to Admission medications   Medication Sig Start Date End Date Taking? Authorizing Provider  aspirin 81 MG tablet Take 1 tablet (81 mg total) by mouth daily. 04/20/14   Liliane Shi, PA-C  atorvastatin (LIPITOR) 80 MG tablet TAKE ONE TABLET BY MOUTH ONCE DAILY 11/02/14   Renato Shin, MD  hydrocortisone 1 % lotion 3 (three) times daily. 05/25/14   Historical Provider,  MD  lisinopril (PRINIVIL,ZESTRIL) 10 MG tablet Take 1 tablet (10 mg total) by mouth daily. 04/20/14   Liliane Shi, PA-C  metFORMIN (GLUCOPHAGE-XR) 500 MG 24 hr tablet TAKE ONE TABLET BY MOUTH ONCE DAILY 10/19/14   Renato Shin, MD  metoprolol (LOPRESSOR) 50 MG tablet Take 1 tablet (50 mg total) by mouth 2 (two) times daily. 06/10/14   Liliane Shi, PA-C  omeprazole (PRILOSEC) 20 MG capsule Take 1 capsule (20 mg total) by mouth daily. 08/26/14   Renato Shin, MD   Meds Ordered and Administered this Visit   Medications  Tdap (BOOSTRIX) injection 0.5 mL (not administered)    BP 176/95 mmHg  Pulse 72  Temp(Src) 97.9 F (36.6 C) (Oral)  Resp 18  SpO2 97% No data found.   Physical Exam  Constitutional: He is oriented to person, place, and time. He appears well-developed and well-nourished. No distress.  Musculoskeletal: He exhibits no tenderness.  Jagged lac to tuft of distal phal of lrf, partial nail edge involved. Bleeding controlled., no fb seen.  Neurological: He is alert and oriented to person, place, and time.  Skin: Skin is warm and dry.  Nursing note and vitals reviewed.   ED Course  Procedures (including critical care time)  Labs Review Labs Reviewed - No data to display  Imaging Review Dg Finger Ring Left  12/06/2014  CLINICAL DATA:  Laceration to the distal tip of the left ring finger from router blade. Initial encounter. EXAM: LEFT RING FINGER 2+V COMPARISON:  None. FINDINGS: The known soft tissue laceration is partially characterized, at the distal aspect of the left ring finger. A tiny piece of metal at the distal tip of the left ring finger is stable from 2010. No new radiopaque foreign bodies are seen. There is no evidence of osseous disruption. Visualized joint spaces are preserved. IMPRESSION: No evidence of osseous disruption. No new radiopaque foreign body seen. Tiny piece of metal at the distal tip of the left ring finger is stable from 2010. Electronically  Signed   By: Garald Balding M.D.   On: 12/06/2014 19:29     Visual Acuity Review  Right Eye Distance:   Left Eye Distance:   Bilateral Distance:    Right Eye Near:   Left Eye Near:    Bilateral Near:         MDM       [name], MD 12/06/14 1944

## 2014-12-06 NOTE — ED Notes (Signed)
Wound  Cleaned  With  Betadine       As  Well  As   Xeroform  Gauze  In a  Sterile  Fashion

## 2014-12-26 ENCOUNTER — Emergency Department (HOSPITAL_COMMUNITY)
Admission: EM | Admit: 2014-12-26 | Discharge: 2014-12-26 | Disposition: A | Discharge status: Home / Self Care | Payer: Commercial Managed Care - HMO | Source: Home / Self Care | Attending: Emergency Medicine | Admitting: Emergency Medicine

## 2014-12-26 ENCOUNTER — Encounter (HOSPITAL_COMMUNITY): Payer: Self-pay | Admitting: Emergency Medicine

## 2014-12-26 DIAGNOSIS — S61219A Laceration without foreign body of unspecified finger without damage to nail, initial encounter: Secondary | ICD-10-CM | POA: Diagnosis not present

## 2014-12-26 DIAGNOSIS — L089 Local infection of the skin and subcutaneous tissue, unspecified: Secondary | ICD-10-CM

## 2014-12-26 MED ORDER — CEPHALEXIN 500 MG PO CAPS: 500.0000 mg | ORAL_CAPSULE | Freq: Four times a day (QID) | ORAL | Status: DC

## 2014-12-26 NOTE — ED Provider Notes (Addendum)
CSN: [id number]     Arrival date & time 12/26/14  1629 History   First MD Initiated Contact with Patient 12/26/14 1704     No chief complaint on file. CC: left finger pain  (Consider location/radiation/quality/duration/timing/severity/associated sxs/prior Treatment) HPI  He is a 64 year old man here for follow-up of finger laceration. He was seen here about 3 weeks ago for a laceration to his left ring finger. He states it has been healing well, but has been quite painful. He reports a throbbing pain that started about 3 days after the laceration occurred. He denies any redness or drainage. He has been cleaning it with peroxide and applying Neosporin.  Past Medical History  Diagnosis Date  . DIABETES MELLITUS, TYPE II 09/25/2006  . HYPERCHOLESTEROLEMIA   . HYPERTENSION   . PSA, INCREASED 11/30/2009  . RENAL INSUFFICIENCY 09/25/2006  . INSOMNIA 09/10/2008  . Colon polyps 06/17/2011  . Diverticulosis 06/17/2011  . CIGARETTE SMOKER 11/30/2009    Qualifier: Diagnosis of  By: Loanne Drilling MD, Jacelyn Pi   . Hx of cardiovascular stress test     Lexiscan Myoview 3/16:  Inferior ischemia, EF 48%, Intermediate Risk  . Hx of echocardiogram     a.  Echo 3/16:  EF 50-55%, Gr 1 DD  . Wears glasses   . Tinnitus   . Coronary artery disease     a. Myoview 3/16:  Inf ischemia, EF 48%, int risk;  b.  LHC 3/16:  3 v CAD,  c.  s/p CABG x 4 03/2014  . Asthma     PMH:as a child  . GERD (gastroesophageal reflux disease)   . Carotid stenosis     a. Carotid US 3/16:  bilat ICA 1-39%   Past Surgical History  Procedure Laterality Date  . Hernia repair    . Tonsillectomy    . Colonoscopy    . Polypectomy    . Left heart catheterization with coronary angiogram N/A 03/18/2014    Procedure: LEFT HEART CATHETERIZATION WITH CORONARY ANGIOGRAM;  Surgeon: [name], MD;  Location: [facility] CATH LAB;  Service: Cardiovascular;  Laterality: N/A;  . Cataract extraction w/ intraocular lens  implant, bilateral    . Tee without  cardioversion N/A 03/23/2014    Procedure: TRANSESOPHAGEAL ECHOCARDIOGRAM (TEE);  Surgeon: Ivin Poot, MD;  Location: [place];  Service: Open Heart Surgery;  Laterality: N/A;  . Coronary artery bypass graft N/A 03/23/2014    Procedure: CORONARY ARTERY BYPASS GRAFTING (CABG), ON PUMP, TIMES FOUR, USING LEFT INTERNAL MAMMARY ARTERY, BILATERAL GREATER SAPHENOUS VEINS HARVESTED ENDOSCOPICALLY;  Surgeon: Ivin Poot, MD;  Location: [place];  Service: Open Heart Surgery;  Laterality: N/A;   Family History  Problem Relation Age of Onset  . Cancer Father   . Hypertension Father   . Heart disease Mother   . Heart disease Sister   . Kidney failure Sister   . Heart disease Sister   . Arrhythmia Sister   . Heart attack Maternal Grandmother   . Stroke Neg Hx    Social History  Substance Use Topics  . Smoking status: Former Smoker -- 0.00 packs/day for 40 years    Types: Cigarettes    Quit date: 03/17/2014  . Smokeless tobacco: Never Used     Comment: I use e cigarettes now 03/20/14  . Alcohol Use: 7.2 oz/week    12 Cans of beer per week     Comment: "I drink a quart of beer a day."    Review of Systems As  in history of present illness Allergies  Review of patient's allergies indicates no known allergies.  Home Medications   Prior to Admission medications   Medication Sig Start Date End Date Taking? Authorizing Provider  aspirin 81 MG tablet Take 1 tablet (81 mg total) by mouth daily. 04/20/14   Liliane Shi, PA-C  atorvastatin (LIPITOR) 80 MG tablet TAKE ONE TABLET BY MOUTH ONCE DAILY 11/02/14   Renato Shin, MD  cephALEXin (KEFLEX) 500 MG capsule Take 1 capsule (500 mg total) by mouth 4 (four) times daily. 12/26/14   Melony Overly, MD  hydrocortisone 1 % lotion 3 (three) times daily. 05/25/14   Historical Provider, MD  lisinopril (PRINIVIL,ZESTRIL) 10 MG tablet Take 1 tablet (10 mg total) by mouth daily. 04/20/14   Liliane Shi, PA-C  metFORMIN (GLUCOPHAGE-XR) 500 MG 24 hr tablet  TAKE ONE TABLET BY MOUTH ONCE DAILY 10/19/14   Renato Shin, MD  metoprolol (LOPRESSOR) 50 MG tablet Take 1 tablet (50 mg total) by mouth 2 (two) times daily. 06/10/14   Liliane Shi, PA-C  omeprazole (PRILOSEC) 20 MG capsule Take 1 capsule (20 mg total) by mouth daily. 08/26/14   Renato Shin, MD   Meds Ordered and Administered this Visit  Medications - No data to display  BP 180/98 mmHg  Pulse 67  Temp(Src) 98 F (36.7 C) (Oral)  Resp 18  SpO2 96% No data found.   Physical Exam  Constitutional: He is oriented to person, place, and time. He appears well-developed and well-nourished. No distress.  Cardiovascular: Normal rate.   Pulmonary/Chest: Effort normal.  Neurological: He is alert and oriented to person, place, and time.  Skin:  Left ring finger: Laceration is healing well. He is quite tender at the tuft of the finger. He also has an area that appears slightly swollen.    ED Course  Procedures (including critical care time)  Labs Review Labs Reviewed - No data to display  Imaging Review No results found.    MDM   1. Infected finger laceration, initial encounter    Given persistent pain and slight swelling, will treat with a course of Keflex. No fluctuance or drainage to suggest abscess. Wound care discussed. Follow-up as needed.    Melony Overly, MD 12/26/14 [id number]  Melony Overly, MD 12/26/14 [phone]

## 2014-12-26 NOTE — ED Notes (Signed)
Left ring finger, concerns for infection.  Injured finger 3 weeks ago using a router.

## 2014-12-26 NOTE — Discharge Instructions (Signed)
The wound is healing well. I am concerned for some infection. Take Keflex 4 times a day for 1 week. Wash the finger twice a day with soap and water. Apply Neosporin twice a day. Follow-up as needed.

## 2015-01-07 ENCOUNTER — Telehealth: Payer: Self-pay | Admitting: Endocrinology

## 2015-01-07 DIAGNOSIS — L259 Unspecified contact dermatitis, unspecified cause: Secondary | ICD-10-CM

## 2015-01-07 NOTE — Telephone Encounter (Signed)
Jinny Blossom, this is for your patient.

## 2015-01-07 NOTE — Telephone Encounter (Signed)
Patient stated that he need a referral with Dr Allyson Sabal dermatologist, his appointment is Jan 26th @ 2:50

## 2015-01-12 NOTE — Telephone Encounter (Signed)
See note below and please advise, Thanks! 

## 2015-01-12 NOTE — Telephone Encounter (Signed)
done

## 2015-01-17 ENCOUNTER — Other Ambulatory Visit: Payer: Self-pay | Admitting: Endocrinology

## 2015-02-04 DIAGNOSIS — L820 Inflamed seborrheic keratosis: Secondary | ICD-10-CM | POA: Diagnosis not present

## 2015-02-04 DIAGNOSIS — L281 Prurigo nodularis: Secondary | ICD-10-CM | POA: Diagnosis not present

## 2015-02-05 ENCOUNTER — Encounter: Payer: Self-pay | Admitting: Endocrinology

## 2015-02-05 ENCOUNTER — Ambulatory Visit (INDEPENDENT_AMBULATORY_CARE_PROVIDER_SITE_OTHER): Payer: Commercial Managed Care - HMO | Admitting: Endocrinology

## 2015-02-05 VITALS — BP 138/80 | HR 70 | Temp 97.5°F | Ht 71.0 in | Wt 248.0 lb

## 2015-02-05 DIAGNOSIS — E7800 Pure hypercholesterolemia, unspecified: Secondary | ICD-10-CM

## 2015-02-05 DIAGNOSIS — E119 Type 2 diabetes mellitus without complications: Secondary | ICD-10-CM

## 2015-02-05 DIAGNOSIS — Z0189 Encounter for other specified special examinations: Secondary | ICD-10-CM

## 2015-02-05 DIAGNOSIS — I10 Essential (primary) hypertension: Secondary | ICD-10-CM

## 2015-02-05 DIAGNOSIS — R200 Anesthesia of skin: Secondary | ICD-10-CM

## 2015-02-05 DIAGNOSIS — E875 Hyperkalemia: Secondary | ICD-10-CM | POA: Diagnosis not present

## 2015-02-05 DIAGNOSIS — Z0000 Encounter for general adult medical examination without abnormal findings: Secondary | ICD-10-CM

## 2015-02-05 DIAGNOSIS — R972 Elevated prostate specific antigen [PSA]: Secondary | ICD-10-CM

## 2015-02-05 LAB — POCT GLYCOSYLATED HEMOGLOBIN (HGB A1C): Hemoglobin A1C: 5.7

## 2015-02-05 NOTE — Progress Notes (Signed)
we discussed code status.  pt requests full code, but would not want to be started or maintained on artificial life-support measures if there was not a reasonable chance of recovery 

## 2015-02-05 NOTE — Progress Notes (Signed)
Subjective:    Patient ID: Shane [name], male    DOB: 29-Jun-1950, 65 y.o.   MRN: [id number]  HPI Pt returns for f/u of diabetes mellitus:  DM type: 2 Dx'ed: 0000000 Complications: none Therapy: metformin DKA: never Severe hypoglycemia: never Pancreatitis: never Other: he has never been on insulin.  Interval history: pt states he feels well in general. Past Medical History  Diagnosis Date  . DIABETES MELLITUS, TYPE II 09/25/2006  . HYPERCHOLESTEROLEMIA   . HYPERTENSION   . PSA, INCREASED 11/30/2009  . RENAL INSUFFICIENCY 09/25/2006  . INSOMNIA 09/10/2008  . Colon polyps 06/17/2011  . Diverticulosis 06/17/2011  . CIGARETTE SMOKER 11/30/2009    Qualifier: Diagnosis of  By: Loanne Drilling MD, Jacelyn Pi   . Hx of cardiovascular stress test     Lexiscan Myoview 3/16:  Inferior ischemia, EF 48%, Intermediate Risk  . Hx of echocardiogram     a.  Echo 3/16:  EF 50-55%, Gr 1 DD  . Wears glasses   . Tinnitus   . Coronary artery disease     a. Myoview 3/16:  Inf ischemia, EF 48%, int risk;  b.  LHC 3/16:  3 v CAD,  c.  s/p CABG x 4 03/2014  . Asthma     PMH:as a child  . GERD (gastroesophageal reflux disease)   . Carotid stenosis     a. Carotid US 3/16:  bilat ICA 1-39%    Past Surgical History  Procedure Laterality Date  . Hernia repair    . Tonsillectomy    . Colonoscopy    . Polypectomy    . Left heart catheterization with coronary angiogram N/A 03/18/2014    Procedure: LEFT HEART CATHETERIZATION WITH CORONARY ANGIOGRAM;  Surgeon: [name], MD;  Location: [facility] CATH LAB;  Service: Cardiovascular;  Laterality: N/A;  . Cataract extraction w/ intraocular lens  implant, bilateral    . Tee without cardioversion N/A 03/23/2014    Procedure: TRANSESOPHAGEAL ECHOCARDIOGRAM (TEE);  Surgeon: Ivin Poot, MD;  Location: [place];  Service: Open Heart Surgery;  Laterality: N/A;  . Coronary artery bypass graft N/A 03/23/2014    Procedure: CORONARY ARTERY BYPASS GRAFTING (CABG), ON PUMP, TIMES FOUR,  USING LEFT INTERNAL MAMMARY ARTERY, BILATERAL GREATER SAPHENOUS VEINS HARVESTED ENDOSCOPICALLY;  Surgeon: Ivin Poot, MD;  Location: [place];  Service: Open Heart Surgery;  Laterality: N/A;    Social History   Social History  . Marital Status: Married    Spouse Name: N/A  . Number of Children: N/A  . Years of Education: N/A   Occupational History  . [employer]    Social History Main Topics  . Smoking status: Former Smoker -- 0.00 packs/day for 40 years    Types: Cigarettes    Quit date: 03/17/2014  . Smokeless tobacco: Never Used     Comment: I use e cigarettes now 03/20/14  . Alcohol Use: 7.2 oz/week    12 Cans of beer per week     Comment: "I drink a quart of beer a day."  . Drug Use: No  . Sexual Activity: Not on file   Other Topics Concern  . 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 aspirin 81 MG tablet Take 1 tablet (81 mg total) by mouth daily.    [name] Kitchen atorvastatin (LIPITOR) 80 MG tablet TAKE ONE TABLET  BY MOUTH ONCE DAILY 90 tablet 0  . lisinopril (PRINIVIL,ZESTRIL) 10 MG tablet Take 1 tablet (10 mg total) by mouth daily. 30 tablet 11  . metFORMIN (GLUCOPHAGE-XR) 500 MG 24 hr tablet TAKE ONE TABLET BY MOUTH ONCE DAILY 90 tablet 0  . metoprolol (LOPRESSOR) 50 MG tablet Take 1 tablet (50 mg total) by mouth 2 (two) times daily. 180 tablet 3  . omeprazole (PRILOSEC) 20 MG capsule Take 1 capsule (20 mg total) by mouth daily. 90 capsule 1  . hydrocortisone 1 % lotion 3 (three) times daily. Reported on 02/05/2015     No current facility-administered medications on file prior to visit.    No Known Allergies  Family History  Problem Relation Age of Onset  . Cancer Father   . Hypertension Father   . Heart disease Mother   . Heart disease Sister   . Kidney failure Sister   . Heart disease Sister   . Arrhythmia Sister   . Heart  attack Maternal Grandmother   . Stroke Neg Hx     BP 138/80 mmHg  Pulse 70  Temp(Src) 97.5 F (36.4 C) (Oral)  Ht 5\' 11"  (1.803 m)  Wt 248 lb (112.492 kg)  BMI 34.60 kg/m2  SpO2 98%  Review of Systems Denies sob    Objective:   Physical Exam VITAL SIGNS:  See vs page GENERAL: no distress Pulses: dorsalis pedis intact bilat.   MSK: no deformity of the feet CV: 1+ bilat leg edema Skin:  no ulcer on the feet.  normal color and temp on the feet. Neuro: sensation is intact to touch on the feet, but decreased from normal Ext: There is bilateral onychomycosis of the toenails.    A1c=5.7%    Assessment & Plan:  DM: well-controlled. Please continue the same metformin.   HTN: well-controlled.  Please continue the same medication.   Dyslipidemia: due for recheck.      Subjective:   Patient here for Medicare annual wellness visit and management of other chronic and acute problems.     Risk factors: advanced age    73 of Physicians Providing Medical Care to Patient:  See "snapshot"   Activities of Daily Living: In your present state of health, do you have any difficulty performing the following activities?:  Preparing food and eating?: No  Bathing yourself: No  Getting dressed: No  Using the toilet:No  Moving around from place to place: No  In the past year have you fallen or had a near fall?:No    Home Safety: Has smoke detector and wears seat belts. No firearms.  Diet and Exercise  Current exercise habits: pt says good Dietary issues discussed: pt reports a somewhat healthy diet   Depression Screen  Q1: Over the past two weeks, have you felt down, depressed or hopeless? no  Q2: Over the past two weeks, have you felt little interest or pleasure in doing things? no   The following portions of the patient's history were reviewed and updated as appropriate: allergies, current medications, past family history, past medical history, past social history, past  surgical history and problem list.   Review of Systems  Denies hearing loss, and visual loss Objective:   Vision:  Advertising account executive, but does not recall name.  Hearing: grossly normal Body mass index:  See vs page Msk: pt easily and quickly performs "get-up-and-go" from a sitting position Cognitive Impairment Assessment: cognition, memory and judgment appear normal.  remembers 0/3 at 5 minutes (? effort).  excellent recall.  can easily read and write a sentence.  alert and oriented x 3   Assessment:   Medicare wellness utd on preventive parameters    Plan:   During the course of the visit the patient was educated and counseled about appropriate screening and preventive services including:        Fall prevention    Diabetes screening  Nutrition counseling   Vaccines / LABS are due soon Zostavax / Pneumococcal Vaccine  today  PSA  Patient Instructions (the written plan) was given to the patient.  good diet and exercise significantly improve the control of your diabetes.  please let me know if you wish to be referred to a dietician.  high blood sugar is very risky to your health.  you should see an eye doctor and dentist every year.  It is very important to get all recommended vaccinations.  please consider these measures for your health:  minimize alcohol.  do not use tobacco products.  have a colonoscopy at least every 10 years from age 40.  Women should have an annual mammogram from age 63.  keep firearms safely stored.  always use seat belts.  have working smoke alarms in your home.  see an eye doctor and dentist regularly.  never drive under the influence of alcohol or drugs (including prescription drugs).   please let me know what your wishes would be, if artificial life support measures should become necessary.  it is critically important to prevent falling down (keep floor areas well-lit, dry, and free of loose objects.  If you have a cane, walker, or wheelchair, you should use  it, even for short trips around the house.  Also, try not to rush)

## 2015-02-05 NOTE — Patient Instructions (Addendum)
Please come back soon for an regular physical (must be after 02/26/15). Please do blood tests any day after 02/11/15. please consider these measures for your health:  minimize alcohol.  do not use tobacco products.  have a colonoscopy at least every 10 years from age 67.  Women should have an annual mammogram from age 55.  keep firearms safely stored.  always use seat belts.  have working smoke alarms in your home.  see an eye doctor and dentist regularly.  never drive under the influence of alcohol or drugs (including prescription drugs).   it is critically important to prevent falling down (keep floor areas well-lit, dry, and free of loose objects.  If you have a cane, walker, or wheelchair, you should use it, even for short trips around the house.  Also, try not to rush).

## 2015-03-12 ENCOUNTER — Encounter: Payer: Self-pay | Admitting: Endocrinology

## 2015-03-12 ENCOUNTER — Ambulatory Visit (INDEPENDENT_AMBULATORY_CARE_PROVIDER_SITE_OTHER): Payer: Commercial Managed Care - HMO | Admitting: Endocrinology

## 2015-03-12 VITALS — BP 130/87 | HR 71 | Temp 98.2°F | Ht 71.0 in | Wt 246.0 lb

## 2015-03-12 DIAGNOSIS — H9319 Tinnitus, unspecified ear: Secondary | ICD-10-CM | POA: Insufficient documentation

## 2015-03-12 DIAGNOSIS — Z0000 Encounter for general adult medical examination without abnormal findings: Secondary | ICD-10-CM

## 2015-03-12 DIAGNOSIS — R972 Elevated prostate specific antigen [PSA]: Secondary | ICD-10-CM | POA: Diagnosis not present

## 2015-03-12 DIAGNOSIS — E875 Hyperkalemia: Secondary | ICD-10-CM

## 2015-03-12 DIAGNOSIS — Z0001 Encounter for general adult medical examination with abnormal findings: Secondary | ICD-10-CM | POA: Diagnosis not present

## 2015-03-12 DIAGNOSIS — E119 Type 2 diabetes mellitus without complications: Secondary | ICD-10-CM | POA: Diagnosis not present

## 2015-03-12 DIAGNOSIS — F17200 Nicotine dependence, unspecified, uncomplicated: Secondary | ICD-10-CM

## 2015-03-12 DIAGNOSIS — N259 Disorder resulting from impaired renal tubular function, unspecified: Secondary | ICD-10-CM

## 2015-03-12 DIAGNOSIS — E1159 Type 2 diabetes mellitus with other circulatory complications: Secondary | ICD-10-CM

## 2015-03-12 LAB — CBC WITH DIFFERENTIAL/PLATELET
BASOS PCT: 0.5 % (ref 0.0–3.0)
Basophils Absolute: 0 10*3/uL (ref 0.0–0.1)
EOS ABS: 0.3 10*3/uL (ref 0.0–0.7)
EOS PCT: 4 % (ref 0.0–5.0)
HEMATOCRIT: 36.3 % — AB (ref 39.0–52.0)
HEMOGLOBIN: 12 g/dL — AB (ref 13.0–17.0)
LYMPHS PCT: 20.3 % (ref 12.0–46.0)
Lymphs Abs: 1.7 10*3/uL (ref 0.7–4.0)
MCHC: 33.1 g/dL (ref 30.0–36.0)
MCV: 74.9 fl — AB (ref 78.0–100.0)
MONOS PCT: 13.7 % — AB (ref 3.0–12.0)
Monocytes Absolute: 1.1 10*3/uL — ABNORMAL HIGH (ref 0.1–1.0)
NEUTROS ABS: 5.1 10*3/uL (ref 1.4–7.7)
Neutrophils Relative %: 61.5 % (ref 43.0–77.0)
PLATELETS: 193 10*3/uL (ref 150.0–400.0)
RBC: 4.85 Mil/uL (ref 4.22–5.81)
RDW: 17.6 % — AB (ref 11.5–15.5)
WBC: 8.3 10*3/uL (ref 4.0–10.5)

## 2015-03-12 LAB — PSA: PSA: 2.71 ng/mL (ref 0.10–4.00)

## 2015-03-12 LAB — HEPATIC FUNCTION PANEL
ALT: 13 U/L (ref 0–53)
AST: 15 U/L (ref 0–37)
Albumin: 4 g/dL (ref 3.5–5.2)
Alkaline Phosphatase: 90 U/L (ref 39–117)
BILIRUBIN DIRECT: 0.1 mg/dL (ref 0.0–0.3)
BILIRUBIN TOTAL: 0.6 mg/dL (ref 0.2–1.2)
Total Protein: 6.4 g/dL (ref 6.0–8.3)

## 2015-03-12 LAB — TSH: TSH: 1.79 u[IU]/mL (ref 0.35–4.50)

## 2015-03-12 LAB — URINALYSIS, ROUTINE W REFLEX MICROSCOPIC
Bilirubin Urine: NEGATIVE
KETONES UR: NEGATIVE
Leukocytes, UA: NEGATIVE
Nitrite: NEGATIVE
PH: 7 (ref 5.0–8.0)
SPECIFIC GRAVITY, URINE: 1.015 (ref 1.000–1.030)
Total Protein, Urine: 30 — AB
Urine Glucose: NEGATIVE
Urobilinogen, UA: 0.2 (ref 0.0–1.0)
WBC UA: NONE SEEN (ref 0–?)

## 2015-03-12 LAB — BASIC METABOLIC PANEL
BUN: 17 mg/dL (ref 6–23)
CALCIUM: 8.8 mg/dL (ref 8.4–10.5)
CHLORIDE: 105 meq/L (ref 96–112)
CO2: 28 meq/L (ref 19–32)
CREATININE: 1.02 mg/dL (ref 0.40–1.50)
GFR: 93.68 mL/min (ref >60.00)
Glucose, Bld: 61 mg/dL — ABNORMAL LOW (ref 70–99)
POTASSIUM: 4.3 meq/L (ref 3.5–5.1)
Sodium: 141 mEq/L (ref 135–145)

## 2015-03-12 LAB — LIPID PANEL
CHOL/HDL RATIO: 3
CHOLESTEROL: 130 mg/dL (ref 0–200)
HDL: 39.6 mg/dL (ref >39.00)
LDL CALC: 77 mg/dL (ref 0–99)
NonHDL: 90.82
TRIGLYCERIDES: 68 mg/dL (ref 0.0–149.0)
VLDL: 13.6 mg/dL (ref 0.0–40.0)

## 2015-03-12 LAB — MICROALBUMIN / CREATININE URINE RATIO
CREATININE, U: 67.3 mg/dL
Microalb Creat Ratio: 55.4 mg/g — ABNORMAL HIGH (ref 0.0–30.0)
Microalb, Ur: 37.3 mg/dL — ABNORMAL HIGH (ref 0.0–1.9)

## 2015-03-12 LAB — HEMOGLOBIN A1C: HEMOGLOBIN A1C: 6 % (ref 4.6–6.5)

## 2015-03-12 MED ORDER — TRAZODONE HCL 150 MG PO TABS
ORAL_TABLET | ORAL | Status: DC
Start: 2015-03-12 — End: 2016-06-13

## 2015-03-12 NOTE — Progress Notes (Signed)
we discussed code status.  pt requests full code, but would not want to be started or maintained on artificial life-support measures if there was not a reasonable chance of recovery 

## 2015-03-12 NOTE — Progress Notes (Signed)
Subjective:    Patient ID: Shane [name], male    DOB: 1950-07-03, 65 y.o.   MRN: [id number]  HPI Pt is here for regular wellness examination, and is feeling pretty well in general, and says chronic med probs are stable, except as noted below Past Medical History  Diagnosis Date  . DIABETES MELLITUS, TYPE II 09/25/2006  . HYPERCHOLESTEROLEMIA   . HYPERTENSION   . PSA, INCREASED 11/30/2009  . RENAL INSUFFICIENCY 09/25/2006  . INSOMNIA 09/10/2008  . Colon polyps 06/17/2011  . Diverticulosis 06/17/2011  . CIGARETTE SMOKER 11/30/2009    Qualifier: Diagnosis of  By: Loanne Drilling MD, Jacelyn Pi   . Hx of cardiovascular stress test     Lexiscan Myoview 3/16:  Inferior ischemia, EF 48%, Intermediate Risk  . Hx of echocardiogram     a.  Echo 3/16:  EF 50-55%, Gr 1 DD  . Wears glasses   . Tinnitus   . Coronary artery disease     a. Myoview 3/16:  Inf ischemia, EF 48%, int risk;  b.  LHC 3/16:  3 v CAD,  c.  s/p CABG x 4 03/2014  . Asthma     PMH:as a child  . GERD (gastroesophageal reflux disease)   . Carotid stenosis     a. Carotid US 3/16:  bilat ICA 1-39%    Past Surgical History  Procedure Laterality Date  . Hernia repair    . Tonsillectomy    . Colonoscopy    . Polypectomy    . Left heart catheterization with coronary angiogram N/A 03/18/2014    Procedure: LEFT HEART CATHETERIZATION WITH CORONARY ANGIOGRAM;  Surgeon: [name], MD;  Location: [facility] CATH LAB;  Service: Cardiovascular;  Laterality: N/A;  . Cataract extraction w/ intraocular lens  implant, bilateral    . Tee without cardioversion N/A 03/23/2014    Procedure: TRANSESOPHAGEAL ECHOCARDIOGRAM (TEE);  Surgeon: Ivin Poot, MD;  Location: [place];  Service: Open Heart Surgery;  Laterality: N/A;  . Coronary artery bypass graft N/A 03/23/2014    Procedure: CORONARY ARTERY BYPASS GRAFTING (CABG), ON PUMP, TIMES FOUR, USING LEFT INTERNAL MAMMARY ARTERY, BILATERAL GREATER SAPHENOUS VEINS HARVESTED ENDOSCOPICALLY;  Surgeon: Ivin Poot,  MD;  Location: [place];  Service: Open Heart Surgery;  Laterality: N/A;    Social History   Social History  . Marital Status: Married    Spouse Name: N/A  . Number of Children: N/A  . Years of Education: N/A   Occupational History  . [employer]    Social History Main Topics  . Smoking status: Former Smoker -- 0.00 packs/day for 40 years    Types: Cigarettes    Quit date: 03/17/2014  . Smokeless tobacco: Never Used     Comment: I use e cigarettes now 03/20/14  . Alcohol Use: 7.2 oz/week    12 Cans of beer per week     Comment: "I drink a quart of beer a day."  . Drug Use: No  . Sexual Activity: Not on file   Other Topics Concern  . 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 aspirin 81 MG tablet Take 1 tablet (81 mg total) by mouth daily.    [name] Kitchen atorvastatin (LIPITOR) 80 MG tablet TAKE ONE TABLET BY MOUTH ONCE DAILY 90 tablet 0  . hydrocortisone 1 % lotion 3 (three) times  daily. Reported on 02/05/2015    . lisinopril (PRINIVIL,ZESTRIL) 10 MG tablet Take 1 tablet (10 mg total) by mouth daily. 30 tablet 11  . metFORMIN (GLUCOPHAGE-XR) 500 MG 24 hr tablet TAKE ONE TABLET BY MOUTH ONCE DAILY 90 tablet 0  . metoprolol (LOPRESSOR) 50 MG tablet Take 1 tablet (50 mg total) by mouth 2 (two) times daily. 180 tablet 3  . omeprazole (PRILOSEC) 20 MG capsule Take 1 capsule (20 mg total) by mouth daily. 90 capsule 1   No current facility-administered medications on file prior to visit.    No Known Allergies  Family History  Problem Relation Age of Onset  . Cancer Father   . Hypertension Father   . Heart disease Mother   . Heart disease Sister   . Kidney failure Sister   . Heart disease Sister   . Arrhythmia Sister   . Heart attack Maternal Grandmother   . Stroke Neg Hx     BP 130/87 mmHg  Pulse 71  Temp(Src) 98.2 F (36.8 C) (Oral)  Ht 5'  11" (1.803 m)  Wt 246 lb (111.585 kg)  BMI 34.33 kg/m2  SpO2 97%  Review of Systems  Constitutional: Negative for fever.  HENT: Negative for hearing loss.   Eyes: Negative for visual disturbance.  Respiratory: Negative for shortness of breath.   Cardiovascular: Negative for chest pain.  Gastrointestinal: Negative for abdominal pain.  Endocrine: Negative for cold intolerance.  Genitourinary: Negative for hematuria and difficulty urinating.  Musculoskeletal: Negative for back pain.  Skin: Negative for rash.  Allergic/Immunologic: Positive for environmental allergies.  Neurological: Negative for numbness.  Hematological: Does not bruise/bleed easily.  Psychiatric/Behavioral: Negative for dysphoric mood.       Objective:   Physical Exam VS: see vs page GEN: no distress HEAD: head: no deformity eyes: no periorbital swelling, no proptosis external nose and ears are normal mouth: no lesion seen NECK: supple, thyroid is not enlarged CHEST WALL: no deformity.  Old healed surgical scar (median sternotomy) LUNGS: clear to auscultation BREASTS:  No gynecomastia CV: reg rate and rhythm, no murmur ABD: abdomen is soft, nontender.  no hepatosplenomegaly.  not distended.  Self-reducing ventral hernia GENITALIA/RECTAL/PROSTATE: sees urology MUSCULOSKELETAL: muscle bulk and strength are grossly normal.  no obvious joint swelling.  gait is normal and steady EXTEMITIES: no deformity.  no ulcer on the feet.  feet are of normal color and temp.  Trace bilat leg edema.  There is bilateral onychomycosis of the toenails.  PULSES: dorsalis pedis intact bilat.  no carotid bruit NEURO:  cn 2-12 grossly intact.   readily moves all 4's.  sensation is intact to touch on the feet SKIN:  Normal texture and temperature.  No rash or suspicious lesion is visible.   NODES:  None palpable at the neck PSYCH: alert, well-oriented.  Does not appear anxious nor depressed.     Assessment & Plan:  Wellness visit  today, with problems stable, except as noted.   SEPARATE EVALUATION FOLLOWS--EACH PROBLEM HERE IS NEW, NOT RESPONDING TO TREATMENT, OR POSES SIGNIFICANT RISK TO THE PATIENT'S HEALTH: HISTORY OF THE PRESENT ILLNESS: Pt returns for f/u of diabetes mellitus:  DM type: 2 Dx'ed: 0000000 Complications: CAD Therapy: metformin DKA: never Severe hypoglycemia: never Pancreatitis: never Other: he has never been on insulin.  Interval history: pt states he feels well in general. He has many years of tinnitus from both ears, but no assoc pain PAST MEDICAL HISTORY reviewed and up to date today REVIEW OF SYSTEMS:  Insomnia has recurred.  No weight change PHYSICAL EXAMINATION: VITAL SIGNS:  See vs page Both eac's and tm's are normal GENERAL: no distress.  LAB/XRAY RESULTS: Lab Results  Component Value Date   WBC 8.3 03/12/2015   HGB 12.0* 03/12/2015   HCT 36.3* 03/12/2015   MCV 74.9* 03/12/2015   PLT 193.0 03/12/2015   Lab Results  Component Value Date   HGBA1C 6.0 03/12/2015  IMPRESSION: Tinnitus, new to me. DM: well-controlled. Anemia: improved, but persists Proteinuria: new PLAN: Same metformin. Ref ENT keep the blood sugar, pressure, and cholesterol normal (which you are doing). Your anemia is much better since your surgery. Our office will send you some tests for blood in the bowels. please follow the instructions, and return to the lab.

## 2015-03-12 NOTE — Patient Instructions (Addendum)
blood tests are requested for you today.  We'll let you know about the results. Let's check a CT scan (lung cancer screening).  you will receive a phone call, about a day and time for an appointment please consider these measures for your health:  minimize alcohol.  do not use tobacco products.  have a colonoscopy at least every 10 years from age 65.  keep firearms safely stored.  always use seat belts.  have working smoke alarms in your home.  see an eye doctor and dentist regularly.  never drive under the influence of alcohol or drugs (including prescription drugs).  it is critically important to prevent falling down (keep floor areas well-lit, dry, and free of loose objects.  If you have a cane, walker, or wheelchair, you should use it, even for short trips around the house.  Wear flat-soled shoes.  Also, try not to rush) i have sent a prescription to your pharmacy, for the sleep.  Please see an ENT specialist.  you will receive a phone call, about a day and time for an appointment.   Please return in 1 year.

## 2015-03-13 ENCOUNTER — Telehealth: Payer: Self-pay | Admitting: Endocrinology

## 2015-03-13 NOTE — Telephone Encounter (Signed)
Please send pt hemoccults, thank you.

## 2015-03-15 NOTE — Telephone Encounter (Signed)
I contacted the pt and advised Hemoccult cards have been placed in the mail. Pt voiced understanding.

## 2015-03-16 ENCOUNTER — Encounter: Payer: Self-pay | Admitting: Gastroenterology

## 2015-04-07 ENCOUNTER — Other Ambulatory Visit: Payer: Self-pay | Admitting: Endocrinology

## 2015-04-07 DIAGNOSIS — F17200 Nicotine dependence, unspecified, uncomplicated: Secondary | ICD-10-CM

## 2015-04-11 ENCOUNTER — Other Ambulatory Visit: Payer: Self-pay | Admitting: Endocrinology

## 2015-04-11 ENCOUNTER — Other Ambulatory Visit: Payer: Self-pay | Admitting: Physician Assistant

## 2015-04-19 ENCOUNTER — Other Ambulatory Visit: Payer: Self-pay | Admitting: Endocrinology

## 2015-04-27 ENCOUNTER — Other Ambulatory Visit: Payer: Self-pay

## 2015-04-27 ENCOUNTER — Other Ambulatory Visit: Payer: Commercial Managed Care - HMO

## 2015-04-27 DIAGNOSIS — K625 Hemorrhage of anus and rectum: Secondary | ICD-10-CM

## 2015-04-27 DIAGNOSIS — H9313 Tinnitus, bilateral: Secondary | ICD-10-CM | POA: Insufficient documentation

## 2015-04-27 DIAGNOSIS — M542 Cervicalgia: Secondary | ICD-10-CM | POA: Insufficient documentation

## 2015-04-27 LAB — HEMOCCULT SLIDES (X 3 CARDS)
Fecal Occult Blood: NEGATIVE
OCCULT 1: NEGATIVE
OCCULT 2: NEGATIVE
OCCULT 3: NEGATIVE
OCCULT 4: NEGATIVE
OCCULT 5: NEGATIVE

## 2015-05-12 ENCOUNTER — Other Ambulatory Visit: Payer: Self-pay | Admitting: Acute Care

## 2015-05-12 DIAGNOSIS — Z87891 Personal history of nicotine dependence: Secondary | ICD-10-CM

## 2015-05-15 ENCOUNTER — Other Ambulatory Visit: Payer: Self-pay | Admitting: Cardiovascular Disease

## 2015-05-15 ENCOUNTER — Other Ambulatory Visit: Payer: Self-pay | Admitting: Endocrinology

## 2015-05-19 ENCOUNTER — Encounter: Payer: Self-pay | Admitting: Gastroenterology

## 2015-05-19 ENCOUNTER — Ambulatory Visit (AMBULATORY_SURGERY_CENTER): Payer: Self-pay | Admitting: *Deleted

## 2015-05-19 VITALS — Ht 71.0 in | Wt 235.0 lb

## 2015-05-19 DIAGNOSIS — Z86010 Personal history of colonic polyps: Secondary | ICD-10-CM

## 2015-05-19 MED ORDER — NA SULFATE-K SULFATE-MG SULF 17.5-3.13-1.6 GM/177ML PO SOLN: ORAL | Status: DC

## 2015-05-19 NOTE — Progress Notes (Signed)
Patient denies any allergies to eggs or soy. Patient denies any problems with anesthesia/sedation. Patient denies any oxygen use at home and does not take any diet/weight loss medications. EMMI education assisgned to patient on colonoscopy, this was explained and instructions given to patient. 

## 2015-05-21 ENCOUNTER — Telehealth: Payer: Self-pay | Admitting: Endocrinology

## 2015-05-21 DIAGNOSIS — F17200 Nicotine dependence, unspecified, uncomplicated: Secondary | ICD-10-CM

## 2015-05-21 MED ORDER — GABAPENTIN 100 MG PO CAPS: 100.0000 mg | ORAL_CAPSULE | Freq: Every day | ORAL | Status: DC

## 2015-05-21 NOTE — Telephone Encounter (Signed)
See note below, Thanks! 

## 2015-05-21 NOTE — Telephone Encounter (Signed)
Ok, i have sent a prescription to your pharmacy We have to start with a small amount i'll see you next time.

## 2015-05-21 NOTE — Telephone Encounter (Signed)
See note below

## 2015-05-21 NOTE — Telephone Encounter (Signed)
Rodena Piety from Pulmonary stated she need a referral for patient to have the lung cancer screening, before he can have the CT test done. [phone]

## 2015-05-21 NOTE — Telephone Encounter (Signed)
done

## 2015-05-21 NOTE — Telephone Encounter (Signed)
I contacted the pt and advised Rx for Gabapentin has been sent. Pt voiced understanding.

## 2015-05-21 NOTE — Telephone Encounter (Signed)
Patient stated he need something for diabetic nerve pain, please advise

## 2015-05-25 ENCOUNTER — Telehealth: Payer: Self-pay | Admitting: Endocrinology

## 2015-05-25 DIAGNOSIS — F17200 Nicotine dependence, unspecified, uncomplicated: Secondary | ICD-10-CM

## 2015-05-25 NOTE — Telephone Encounter (Signed)
Shane [name] From [facility] need referral put in for [facility] as well, for Dr Lamonte Sakai Dx [phone], [facility] Physician Management Coordination of Care,  any questions call, [phone]

## 2015-05-25 NOTE — Telephone Encounter (Signed)
I contacted Rodena Piety with [facility] and she stated the ICD- 10 Code she uses to her the CT approved is Got97 (Low dose Lung Cancer Screening).

## 2015-05-25 NOTE — Telephone Encounter (Signed)
i need dx in ICD-10 ot name of the dx

## 2015-05-25 NOTE — Telephone Encounter (Signed)
See note below and please advise, Thanks! 

## 2015-05-25 NOTE — Telephone Encounter (Signed)
done

## 2015-06-02 ENCOUNTER — Encounter: Payer: Self-pay | Admitting: Gastroenterology

## 2015-06-02 ENCOUNTER — Ambulatory Visit (AMBULATORY_SURGERY_CENTER): Payer: Commercial Managed Care - HMO | Admitting: Gastroenterology

## 2015-06-02 VITALS — BP 107/66 | HR 58 | Temp 98.9°F | Resp 10 | Ht 71.0 in | Wt 235.0 lb

## 2015-06-02 DIAGNOSIS — K621 Rectal polyp: Secondary | ICD-10-CM | POA: Diagnosis not present

## 2015-06-02 DIAGNOSIS — D128 Benign neoplasm of rectum: Secondary | ICD-10-CM

## 2015-06-02 DIAGNOSIS — D129 Benign neoplasm of anus and anal canal: Secondary | ICD-10-CM

## 2015-06-02 DIAGNOSIS — D125 Benign neoplasm of sigmoid colon: Secondary | ICD-10-CM

## 2015-06-02 DIAGNOSIS — Z86010 Personal history of colonic polyps: Secondary | ICD-10-CM | POA: Diagnosis not present

## 2015-06-02 DIAGNOSIS — Z951 Presence of aortocoronary bypass graft: Secondary | ICD-10-CM | POA: Diagnosis not present

## 2015-06-02 DIAGNOSIS — D123 Benign neoplasm of transverse colon: Secondary | ICD-10-CM | POA: Diagnosis not present

## 2015-06-02 DIAGNOSIS — G4700 Insomnia, unspecified: Secondary | ICD-10-CM | POA: Diagnosis not present

## 2015-06-02 DIAGNOSIS — I10 Essential (primary) hypertension: Secondary | ICD-10-CM | POA: Diagnosis not present

## 2015-06-02 HISTORY — PX: COLONOSCOPY: SHX174

## 2015-06-02 LAB — GLUCOSE, CAPILLARY
Glucose-Capillary: 86 mg/dL (ref 65–99)
Glucose-Capillary: 84 mg/dL (ref 65–99)

## 2015-06-02 MED ORDER — SODIUM CHLORIDE 0.9 % IV SOLN: 500.0000 mL | INTRAVENOUS | Status: DC

## 2015-06-02 NOTE — Patient Instructions (Signed)
Discharge instructions given. Handouts on polyps,diverticulosis and hemorrhoids. Resume previous medications. YOU HAD AN ENDOSCOPIC PROCEDURE TODAY AT THE [facility]:   Refer to the procedure report that was given to you for any specific questions about what was found during the examination.  If the procedure report does not answer your questions, please call your gastroenterologist to clarify.  If you requested that your care partner not be given the details of your procedure findings, then the procedure report has been included in a sealed envelope for you to review at your convenience later.  YOU SHOULD EXPECT: Some feelings of bloating in the abdomen. Passage of more gas than usual.  Walking can help get rid of the air that was put into your GI tract during the procedure and reduce the bloating. If you had a lower endoscopy (such as a colonoscopy or flexible sigmoidoscopy) you may notice spotting of blood in your stool or on the toilet paper. If you underwent a bowel prep for your procedure, you may not have a normal bowel movement for a few days.  Please Note:  You might notice some irritation and congestion in your nose or some drainage.  This is from the oxygen used during your procedure.  There is no need for concern and it should clear up in a day or so.  SYMPTOMS TO REPORT IMMEDIATELY:   Following lower endoscopy (colonoscopy or flexible sigmoidoscopy):  Excessive amounts of blood in the stool  Significant tenderness or worsening of abdominal pains  Swelling of the abdomen that is new, acute  Fever of 100F or higher   For urgent or emergent issues, a gastroenterologist can be reached at any hour by calling [phone].   DIET: Your first meal following the procedure should be a small meal and then it is ok to progress to your normal diet. Heavy or fried foods are harder to digest and may make you feel nauseous or bloated.  Likewise, meals heavy in dairy and  vegetables can increase bloating.  Drink plenty of fluids but you should avoid alcoholic beverages for 24 hours.  ACTIVITY:  You should plan to take it easy for the rest of today and you should NOT DRIVE or use heavy machinery until tomorrow (because of the sedation medicines used during the test).    FOLLOW UP: Our staff will call the number listed on your records the next business day following your procedure to check on you and address any questions or concerns that you may have regarding the information given to you following your procedure. If we do not reach you, we will leave a message.  However, if you are feeling well and you are not experiencing any problems, there is no need to return our call.  We will assume that you have returned to your regular daily activities without incident.  If any biopsies were taken you will be contacted by phone or by letter within the next 1-3 weeks.  Please call us at [phone] if you have not heard about the biopsies in 3 weeks.    SIGNATURES/CONFIDENTIALITY: You and/or your care partner have signed paperwork which will be entered into your electronic medical record.  These signatures attest to the fact that that the information above on your After Visit Summary has been reviewed and is understood.  Full responsibility of the confidentiality of this discharge information lies with you and/or your care-partner. 

## 2015-06-02 NOTE — Progress Notes (Signed)
Called to room to assist during endoscopic procedure.  Patient ID and intended procedure confirmed with present staff. Received instructions for my participation in the procedure from the performing physician.  

## 2015-06-02 NOTE — Op Note (Signed)
[place] Patient Name: Shane [name] Procedure Date: 06/02/2015 8:57 AM MRN: [id number] Endoscopist: Ladene Artist , MD Age: 65 Referring MD:  Date of Birth: 09/25/1950 Gender: Male Procedure:                Colonoscopy Indications:              Surveillance: Personal history of adenomatous                            polyps on last colonoscopy > 3 years ago Medicines:                Monitored Anesthesia Care Procedure:                Pre-Anesthesia Assessment:                           - Prior to the procedure, a History and Physical                            was performed, and patient medications and                            allergies were reviewed. The patient's tolerance of                            previous anesthesia was also reviewed. The risks                            and benefits of the procedure and the sedation                            options and risks were discussed with the patient.                            All questions were answered, and informed consent                            was obtained. Prior Anticoagulants: The patient has                            taken no previous anticoagulant or antiplatelet                            agents. ASA Grade Assessment: II - A patient with                            mild systemic disease. After reviewing the risks                            and benefits, the patient was deemed in                            satisfactory condition to undergo the procedure.  After obtaining informed consent, the colonoscope                            was passed under direct vision. Throughout the                            procedure, the patient's blood pressure, pulse, and                            oxygen saturations were monitored continuously. The                            Model PCF-H190L [id number]) scope was introduced                            through the anus and advanced to the the cecum,                             identified by appendiceal orifice and ileocecal                            valve. The colonoscopy was performed without                            difficulty. The patient tolerated the procedure                            well. The quality of the bowel preparation was                            excellent. The ileocecal valve, appendiceal                            orifice, and rectum were photographed. Scope In: 9:14:55 AM Scope Out: 9:29:48 AM Scope Withdrawal Time: 0 hours 13 minutes 13 seconds  Total Procedure Duration: 0 hours 14 minutes 53 seconds  Findings:                 The digital rectal exam was normal.                           Five sessile polyps were found in the sigmoid colon                            and transverse colon. The polyps were 6 to 8 mm in                            size. These polyps were removed with a cold snare.                            Resection and retrieval were complete.                           Two sessile polyps were found in the  transverse                            colon. The polyps were 4 mm in size. These polyps                            were removed with a cold biopsy forceps. Resection                            and retrieval were complete.                           Many small-mouthed diverticula were found in the                            transverse colon. There was no evidence of                            diverticular bleeding.                           Multiple small-mouthed diverticula were found in                            the sigmoid colon and descending colon. There was                            evidence of diverticular spasm.                           Internal hemorrhoids were found during                            retroflexion. The hemorrhoids were small and Grade                            I (internal hemorrhoids that do not prolapse).                           The exam was otherwise normal  throughout the                            examined colon. Complications:            No immediate complications. Estimated Blood Loss:     Estimated blood loss: none. Impression:               - Five 6 to 8 mm polyps in the sigmoid colon and in                            the transverse colon, removed with a cold snare.                            Resected and retrieved.                           -  Two 4 mm polyps in the transverse colon, removed                            with a cold biopsy forceps. Resected and retrieved.                           - Mild diverticulosis in the transverse colon.                           - Moderate diverticulosis in the sigmoid colon and                            in the descending colon.                           - Internal hemorrhoids. Recommendation:           - Patient has a contact number available for                            emergencies. The signs and symptoms of potential                            delayed complications were discussed with the                            patient. Return to normal activities tomorrow.                            Written discharge instructions were provided to the                            patient.                           - Continue present medications.                           - Await pathology results.                           - Repeat colonoscopy in 3 years for surveillance if                            3 or more polyps precancerous, otherwise 5 years.                           - High fiber diet indefinitely. Ladene Artist, MD 06/02/2015 9:40:53 AM This report has been signed electronically.

## 2015-06-02 NOTE — Progress Notes (Signed)
To pacu vss patent aw report to rn 

## 2015-06-03 ENCOUNTER — Telehealth: Payer: Self-pay | Admitting: *Deleted

## 2015-06-03 NOTE — Telephone Encounter (Signed)
  Follow up Call-  Call back number 06/02/2015  Post procedure Call Back phone  # [phone]  Permission to leave phone message Yes     Patient questions:  Do you have a fever, pain , or abdominal swelling? No. Pain Score  0 *  Have you tolerated food without any problems? Yes.    Have you been able to return to your normal activities? Yes.    Do you have any questions about your discharge instructions: Diet   No. Medications  No. Follow up visit  No.  Do you have questions or concerns about your Care? No.  Actions: * If pain score is 4 or above: No action needed, pain <4.

## 2015-06-08 ENCOUNTER — Encounter: Payer: Self-pay | Admitting: Gastroenterology

## 2015-06-12 ENCOUNTER — Other Ambulatory Visit: Payer: Self-pay | Admitting: Physician Assistant

## 2015-06-12 ENCOUNTER — Other Ambulatory Visit: Payer: Self-pay | Admitting: Cardiovascular Disease

## 2015-06-14 ENCOUNTER — Encounter: Payer: Self-pay | Admitting: Acute Care

## 2015-06-14 ENCOUNTER — Ambulatory Visit (INDEPENDENT_AMBULATORY_CARE_PROVIDER_SITE_OTHER)
Admission: RE | Admit: 2015-06-14 | Discharge: 2015-06-14 | Disposition: A | Discharge status: Home / Self Care | Payer: Commercial Managed Care - HMO | Source: Ambulatory Visit | Attending: Acute Care | Admitting: Acute Care

## 2015-06-14 ENCOUNTER — Ambulatory Visit (INDEPENDENT_AMBULATORY_CARE_PROVIDER_SITE_OTHER): Payer: Commercial Managed Care - HMO | Admitting: Acute Care

## 2015-06-14 DIAGNOSIS — Z87891 Personal history of nicotine dependence: Secondary | ICD-10-CM

## 2015-06-14 NOTE — Progress Notes (Signed)
Shared Decision Making Visit Lung Cancer Screening Program [id number])   Eligibility:  Age 65 y.o.  Pack Years Smoking History Calculation 100 pack year smoking history (# packs/per year x # years smoked)  Recent History of coughing up blood  no  Unexplained weight loss? no ( >Than 15 pounds within the last 6 months )  Prior History Lung / other cancer no (Diagnosis within the last 5 years already requiring surveillance chest CT Scans).  Smoking Status Former Smoker  Former Smokers: Years since quit: 1 year  Quit Date: 03/17/2014  Visit Components:  Discussion included one or more decision making aids. yes  Discussion included risk/benefits of screening. yes  Discussion included potential follow up diagnostic testing for abnormal scans. yes  Discussion included meaning and risk of over diagnosis. yes  Discussion included meaning and risk of False Positives. yes  Discussion included meaning of total radiation exposure. yes  Counseling Included:  Importance of adherence to annual lung cancer LDCT screening. yes  Impact of comorbidities on ability to participate in the program. yes  Ability and willingness to under diagnostic treatment. yes  Smoking Cessation Counseling:  Current Smokers:   Discussed importance of smoking cessation. yes  Information about tobacco cessation classes and interventions provided to patient. yes  Patient provided with "ticket" for LDCT Scan. yes  Symptomatic Patient. no  Counseling  Diagnosis Code: Tobacco Use Z72.0  Asymptomatic Patient yes  Counseling NA; Former smoker  Former Smokers:   Discussed the importance of maintaining cigarette abstinence. yes  Diagnosis Code: Personal History of Nicotine Dependence. [id number]  Information about tobacco cessation classes and interventions provided to patient. Yes  Patient provided with "ticket" for LDCT Scan. yes  Written Order for Lung Cancer Screening with LDCT placed in Epic.  Yes (CT Chest Lung Cancer Screening Low Dose W/O CM) [id number] Z12.2-Screening of respiratory organs Z87.891-Personal history of nicotine dependence  I spent 20 minutes of face to face time with Shane [name]  discussing the risks and benefits of lung cancer screening. We viewed a power point together that explained in detail the above noted topics. We took the time to pause the power point at intervals to allow for questions to be asked and answered to ensure understanding. We discussed that he had taken the single most powerful action possible to decrease his risk of developing lung cancer when he quit smoking. I counseled him to remain smoke free, and to contact me if he ever had the desire to smoke again so that I can provide resources and tools to help support the effort to remain smoke free. We discussed the time and location of the scan, and that either [place] or I will call with the results within  24-48 hours of receiving them. He has my card and contact information in the event he needs to speak with me, in addition to a copy of the power point we reviewed as a resource. He verbalized understanding of all of the above and had no further questions upon leaving the office.    Magdalen Spatz, NP 06/14/2015

## 2015-06-17 ENCOUNTER — Encounter: Payer: Commercial Managed Care - HMO | Admitting: Acute Care

## 2015-07-10 ENCOUNTER — Other Ambulatory Visit: Payer: Self-pay | Admitting: Cardiovascular Disease

## 2015-07-10 ENCOUNTER — Other Ambulatory Visit: Payer: Self-pay | Admitting: Endocrinology

## 2015-07-17 ENCOUNTER — Other Ambulatory Visit: Payer: Self-pay | Admitting: Endocrinology

## 2015-07-27 ENCOUNTER — Telehealth: Payer: Self-pay | Admitting: Endocrinology

## 2015-07-27 MED ORDER — METOPROLOL TARTRATE 50 MG PO TABS: 50.0000 mg | ORAL_TABLET | Freq: Two times a day (BID) | ORAL | Status: DC

## 2015-07-27 MED ORDER — LISINOPRIL 10 MG PO TABS: 10.0000 mg | ORAL_TABLET | Freq: Every day | ORAL | Status: DC

## 2015-07-27 MED ORDER — ATORVASTATIN CALCIUM 80 MG PO TABS: 80.0000 mg | ORAL_TABLET | Freq: Every day | ORAL | Status: DC

## 2015-07-27 NOTE — Telephone Encounter (Signed)
Please refill all x 1 year

## 2015-07-27 NOTE — Telephone Encounter (Signed)
Rx submitted per pt's request.  

## 2015-07-27 NOTE — Telephone Encounter (Signed)
Pt is out of meds  He needs atorvastatin 8 mg; lisinopril; metoprolol call into [facility] please

## 2015-07-27 NOTE — Telephone Encounter (Signed)
See below. Metoprolol and lisinopril are not listed under your name for a refill. Please advise if ok to refill. Thanks!

## 2015-08-15 ENCOUNTER — Encounter: Payer: Self-pay | Admitting: Acute Care

## 2015-08-16 ENCOUNTER — Other Ambulatory Visit: Payer: Self-pay

## 2015-08-16 ENCOUNTER — Telehealth: Payer: Self-pay | Admitting: Endocrinology

## 2015-08-16 MED ORDER — OMEPRAZOLE 20 MG PO CPDR: 20.0000 mg | DELAYED_RELEASE_CAPSULE | Freq: Every day | ORAL | 1 refills | Status: DC

## 2015-08-16 NOTE — Telephone Encounter (Signed)
Omeprazole needs to be called into [facility] on [address]

## 2015-08-17 LAB — HM DIABETES EYE EXAM

## 2015-11-29 ENCOUNTER — Other Ambulatory Visit: Payer: Self-pay | Admitting: Acute Care

## 2015-11-29 ENCOUNTER — Telehealth: Payer: Self-pay | Admitting: Acute Care

## 2015-11-29 DIAGNOSIS — Z87891 Personal history of nicotine dependence: Secondary | ICD-10-CM

## 2015-11-29 NOTE — Telephone Encounter (Signed)
This note is a late entry note to  document that the results of Shane [name]'s  low-dose CT screening scan were called to him in June 2016. I explained to him that his scan was read as a lung RADS 2, indicating nodules that are benign in appearance and behavior. Recommendation was for annual lung cancer screening in 12 months. His scan has been scheduled for June 2018. He verbalized understanding of the above and had no further questions at completion of the call

## 2015-12-01 ENCOUNTER — Telehealth: Payer: Self-pay | Admitting: Endocrinology

## 2015-12-01 DIAGNOSIS — L259 Unspecified contact dermatitis, unspecified cause: Secondary | ICD-10-CM

## 2015-12-01 NOTE — Telephone Encounter (Signed)
Pt called and requested a Dermatology referral, he stated he went to see Dr. Allyson Sabal and was not happy with his visit.  He didn't have anyone else in mind but would like for Dr. Loanne Drilling to suggest someone.

## 2015-12-01 NOTE — Telephone Encounter (Signed)
I contacted the patient and advised we have received his message. I advised Dr. Loanne Drilling is currently out of the office and will return on 12/06/2015. Patient was advised once MD returns message will be fwd to him. Patient voiced understanding and had no further questions at this time.

## 2015-12-05 NOTE — Telephone Encounter (Signed)
done

## 2015-12-05 NOTE — Addendum Note (Signed)
Addended by: Renato Shin on: 12/05/2015 11:52 AM   Modules accepted: Orders

## 2015-12-06 NOTE — Telephone Encounter (Signed)
I followed up with the patient and advised referral has been placed. Patient voiced understanding and had no further questions at this time.

## 2016-01-15 ENCOUNTER — Other Ambulatory Visit: Payer: Self-pay | Admitting: Endocrinology

## 2016-01-17 ENCOUNTER — Other Ambulatory Visit: Payer: Self-pay

## 2016-02-12 ENCOUNTER — Other Ambulatory Visit: Payer: Self-pay | Admitting: Endocrinology

## 2016-03-07 DIAGNOSIS — L218 Other seborrheic dermatitis: Secondary | ICD-10-CM | POA: Diagnosis not present

## 2016-03-07 DIAGNOSIS — L720 Epidermal cyst: Secondary | ICD-10-CM | POA: Diagnosis not present

## 2016-03-10 ENCOUNTER — Ambulatory Visit (INDEPENDENT_AMBULATORY_CARE_PROVIDER_SITE_OTHER): Payer: Medicare HMO | Admitting: Endocrinology

## 2016-03-10 ENCOUNTER — Encounter: Payer: Self-pay | Admitting: Endocrinology

## 2016-03-10 VITALS — BP 138/72 | HR 72 | Ht 71.0 in | Wt 246.0 lb

## 2016-03-10 DIAGNOSIS — E1159 Type 2 diabetes mellitus with other circulatory complications: Secondary | ICD-10-CM

## 2016-03-10 LAB — POCT GLYCOSYLATED HEMOGLOBIN (HGB A1C): HEMOGLOBIN A1C: 5.7

## 2016-03-10 NOTE — Progress Notes (Signed)
Subjective:    Patient ID: Shane [name], male    DOB: 04-26-1950, 66 y.o.   MRN: [id number]  HPI  The state of at least three ongoing medical problems is addressed today, with interval history of each noted here: Pt returns for f/u of diabetes mellitus: DM type: 2 Dx'ed: [id number] Complications: CAD Therapy: metformin DKA: never Severe hypoglycemia: never Pancreatitis: never Other: he has never taken insulin.  Interval history: denies nausea.   GERD: denies heartburn.  He says he did not tolerate a trial off prilosec. HTN: he denies sob.  Past Medical History:  Diagnosis Date  . Asthma    PMH:as a child  . Carotid stenosis    a. Carotid US 3/16:  bilat ICA 1-39%  . CIGARETTE SMOKER 11/30/2009   Qualifier: Diagnosis of  By: Loanne Drilling MD, Jacelyn Pi   . Colon polyps 06/17/2011  . Coronary artery disease    a. Myoview 3/16:  Inf ischemia, EF 48%, int risk;  b.  LHC 3/16:  3 v CAD,  c.  s/p CABG x 4 03/2014  . DIABETES MELLITUS, TYPE II 09/25/2006  . Diverticulosis 06/17/2011  . GERD (gastroesophageal reflux disease)   . Hx of cardiovascular stress test    Lexiscan Myoview 3/16:  Inferior ischemia, EF 48%, Intermediate Risk  . Hx of echocardiogram    a.  Echo 3/16:  EF 50-55%, Gr 1 DD  . HYPERCHOLESTEROLEMIA   . HYPERTENSION   . INSOMNIA 09/10/2008  . PSA, INCREASED 11/30/2009  . RENAL INSUFFICIENCY 09/25/2006  . Tinnitus   . Wears glasses     Past Surgical History:  Procedure Laterality Date  . CATARACT EXTRACTION W/ INTRAOCULAR LENS  IMPLANT, BILATERAL    . COLONOSCOPY    . CORONARY ARTERY BYPASS GRAFT N/A 03/23/2014   Procedure: CORONARY ARTERY BYPASS GRAFTING (CABG), ON PUMP, TIMES FOUR, USING LEFT INTERNAL MAMMARY ARTERY, BILATERAL GREATER SAPHENOUS VEINS HARVESTED ENDOSCOPICALLY;  Surgeon: Ivin Poot, MD;  Location: [facility];  Service: Open Heart Surgery;  Laterality: N/A;  . HERNIA REPAIR    . LEFT HEART CATHETERIZATION WITH CORONARY ANGIOGRAM N/A 03/18/2014   Procedure: LEFT  HEART CATHETERIZATION WITH CORONARY ANGIOGRAM;  Surgeon: [name], MD;  Location: [facility] CATH LAB;  Service: Cardiovascular;  Laterality: N/A;  . POLYPECTOMY    . TEE WITHOUT CARDIOVERSION N/A 03/23/2014   Procedure: TRANSESOPHAGEAL ECHOCARDIOGRAM (TEE);  Surgeon: Ivin Poot, MD;  Location: [facility];  Service: Open Heart Surgery;  Laterality: N/A;  . TONSILLECTOMY      Social History   Social History  . Marital status: Married    Spouse name: N/A  . Number of children: N/A  . Years of education: N/A   Occupational History  . [employer]    Social History Main Topics  . Smoking status: Former Smoker    Packs/day: 2.00    Years: 50.00    Types: Cigarettes    Quit date: 03/17/2014  . Smokeless tobacco: Never Used     Comment: none  . Alcohol use No     Comment: none per pt.  . Drug use: No  . Sexual activity: Not on file   Other Topics Concern  . 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 aspirin 81 MG tablet Take 1 tablet (81 mg total) by mouth daily.    [name] Kitchen  atorvastatin (LIPITOR) 80 MG tablet TAKE ONE TABLET BY MOUTH ONCE DAILY 90 tablet 0  . gabapentin (NEURONTIN) 100 MG capsule Take 1 capsule (100 mg total) by mouth at bedtime. 30 capsule 3  . lisinopril (PRINIVIL,ZESTRIL) 10 MG tablet Take 1 tablet (10 mg total) by mouth daily. 30 tablet 11  . metoprolol (LOPRESSOR) 50 MG tablet Take 1 tablet (50 mg total) by mouth 2 (two) times daily. 60 tablet 11  . omeprazole (PRILOSEC) 20 MG capsule TAKE ONE CAPSULE BY MOUTH ONCE DAILY 90 capsule 1  . traZODone (DESYREL) 150 MG tablet TAKE ONE TABLET BY MOUTH AT BEDTIME 90 tablet 2  . metFORMIN (GLUCOPHAGE-XR) 500 MG 24 hr tablet TAKE ONE TABLET BY MOUTH ONCE DAILY (Patient not taking: Reported on 03/10/2016) 90 tablet 2   No current facility-administered medications on file prior to visit.      No Known Allergies  Family History  Problem Relation Age of Onset  . Cancer Father   . Hypertension Father   . Heart disease Mother   . Heart disease Sister   . Kidney failure Sister   . Heart disease Sister   . Arrhythmia Sister   . Heart attack Maternal Grandmother   . Stroke Neg Hx   . Colon cancer Neg Hx     BP 138/72   Pulse 72   Ht 5\' 11"  (1.803 m)   Wt 246 lb (111.6 kg)   SpO2 96%   BMI 34.31 kg/m   Review of Systems Denies chest pain and diarrhea.      Objective:   Physical Exam VITAL SIGNS:  See vs page GENERAL: no distress Pulses: dorsalis pedis intact bilat.   MSK: no deformity of the feet CV: trace bilat leg edema Skin:  no ulcer on the feet.  normal color and temp on the feet. Neuro: sensation is intact to touch on the feet.    A1c=5.7%    Assessment & Plan:  Type 2 DM: well-controlled HTN: well-controlled GERD: as he did not tolerate trial off rx, he should continue Patient is advised the following: Patient Instructions  Please continue the same medications.  Please come back for a regular physical appointment in 3 months.

## 2016-03-10 NOTE — Patient Instructions (Signed)
Please continue the same medications.  Please come back for a regular physical appointment in 3 months.

## 2016-04-15 ENCOUNTER — Ambulatory Visit (HOSPITAL_COMMUNITY)
Admission: EM | Admit: 2016-04-15 | Discharge: 2016-04-15 | Disposition: A | Discharge status: Home / Self Care | Payer: Medicare HMO | Attending: Internal Medicine | Admitting: Internal Medicine

## 2016-04-15 ENCOUNTER — Encounter (HOSPITAL_COMMUNITY): Payer: Self-pay | Admitting: Family Medicine

## 2016-04-15 DIAGNOSIS — L089 Local infection of the skin and subcutaneous tissue, unspecified: Secondary | ICD-10-CM | POA: Diagnosis not present

## 2016-04-15 DIAGNOSIS — S60419A Abrasion of unspecified finger, initial encounter: Secondary | ICD-10-CM

## 2016-04-15 MED ORDER — DOXYCYCLINE HYCLATE 100 MG PO CAPS: 100.0000 mg | ORAL_CAPSULE | Freq: Two times a day (BID) | ORAL | 0 refills | Status: AC

## 2016-04-15 NOTE — Discharge Instructions (Addendum)
Wash finger wound 1-2 times daily, with soap/water gently, and put antibiotic ointment and bandaid on it, until healed.  Prescription for doxycycline (antibiotic) sent to the [facility] on [address].  Recheck if not starting to improve in a few days.

## 2016-04-15 NOTE — ED Provider Notes (Signed)
[place]    CSN: [id number] Arrival date & time: 04/15/16  1652     History   Chief Complaint Chief Complaint  Patient presents with  . Finger Injury    HPI Shane [name] is a 66 y.o. male. 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  Now a little sore/swollen.  Not draining.  No malaise.  Tetanus updated in 2016.    HPI  Past Medical History:  Diagnosis Date  . Asthma    PMH:as a child  . Carotid stenosis    a. Carotid US 3/16:  bilat ICA 1-39%  . CIGARETTE SMOKER 11/30/2009   Qualifier: Diagnosis of  By: Loanne Drilling MD, Jacelyn Pi   . Colon polyps 06/17/2011  . Coronary artery disease    a. Myoview 3/16:  Inf ischemia, EF 48%, int risk;  b.  LHC 3/16:  3 v CAD,  c.  s/p CABG x 4 03/2014  . DIABETES MELLITUS, TYPE II 09/25/2006  . Diverticulosis 06/17/2011  . GERD (gastroesophageal reflux disease)   . Hx of cardiovascular stress test    Lexiscan Myoview 3/16:  Inferior ischemia, EF 48%, Intermediate Risk  . Hx of echocardiogram    a.  Echo 3/16:  EF 50-55%, Gr 1 DD  . HYPERCHOLESTEROLEMIA   . HYPERTENSION   . INSOMNIA 09/10/2008  . PSA, INCREASED 11/30/2009  . RENAL INSUFFICIENCY 09/25/2006  . Tinnitus   . Wears glasses     Patient Active Problem List   Diagnosis Date Noted  . Cervical pain 04/27/2015  . Bilateral tinnitus 04/27/2015  . Diabetes ([place]) 03/13/2015  . Tinnitus 03/12/2015  . Numbness 02/05/2015  . S/P CABG x 4 03/23/2014  . Ischemic chest pain ([place])   . Chest pain 02/25/2014  . Wellness examination 02/10/2014  . Rectal bleeding 02/10/2014  . Hypogonadism male 10/20/2013  . Impotence of organic origin 08/18/2013  . Vitamin B12 deficiency 02/11/2013  . Closed fracture of unspecified part of upper end of humerus 02/10/2013  . Encounter for long-term (current) use of other medications 07/06/2011  . Colon polyps 06/17/2011  . Diverticulosis 06/17/2011  . Contact dermatitis  06/16/2011  . CIGARETTE SMOKER 11/30/2009  . Cough 11/30/2009  . PSA, INCREASED 11/30/2009  . INSOMNIA 09/10/2008  . HYPERCHOLESTEROLEMIA 01/22/2007  . Hyperpotassemia 09/25/2006  . Disorder resulting from impaired renal function 09/25/2006  . Essential hypertension 08/24/2006    Past Surgical History:  Procedure Laterality Date  . CATARACT EXTRACTION W/ INTRAOCULAR LENS  IMPLANT, BILATERAL    . COLONOSCOPY    . CORONARY ARTERY BYPASS GRAFT N/A 03/23/2014   Procedure: CORONARY ARTERY BYPASS GRAFTING (CABG), ON PUMP, TIMES FOUR, USING LEFT INTERNAL MAMMARY ARTERY, BILATERAL GREATER SAPHENOUS VEINS HARVESTED ENDOSCOPICALLY;  Surgeon: Ivin Poot, MD;  Location: [facility];  Service: Open Heart Surgery;  Laterality: N/A;  . HERNIA REPAIR    . LEFT HEART CATHETERIZATION WITH CORONARY ANGIOGRAM N/A 03/18/2014   Procedure: LEFT HEART CATHETERIZATION WITH CORONARY ANGIOGRAM;  Surgeon: [name], MD;  Location: [facility] CATH LAB;  Service: Cardiovascular;  Laterality: N/A;  . POLYPECTOMY    . TEE WITHOUT CARDIOVERSION N/A 03/23/2014   Procedure: TRANSESOPHAGEAL ECHOCARDIOGRAM (TEE);  Surgeon: Ivin Poot, MD;  Location: [place];  Service: Open Heart Surgery;  Laterality: N/A;  . TONSILLECTOMY         Home Medications    Prior to Admission medications   Medication Sig Start Date  End Date Taking? Authorizing Provider  aspirin 81 MG tablet Take 1 tablet (81 mg total) by mouth daily. 04/20/14   Liliane Shi, PA-C  atorvastatin (LIPITOR) 80 MG tablet TAKE ONE TABLET BY MOUTH ONCE DAILY 01/15/16   Renato Shin, MD  doxycycline (VIBRAMYCIN) 100 MG capsule Take 1 capsule (100 mg total) by mouth 2 (two) times daily. 04/15/16 04/22/16  Sherlene Shams, MD  gabapentin (NEURONTIN) 100 MG capsule Take 1 capsule (100 mg total) by mouth at bedtime. 05/21/15   Renato Shin, MD  lisinopril (PRINIVIL,ZESTRIL) 10 MG tablet Take 1 tablet (10 mg total) by mouth daily. 07/27/15   Renato Shin, MD  metFORMIN  (GLUCOPHAGE-XR) 500 MG 24 hr tablet TAKE ONE TABLET BY MOUTH ONCE DAILY Patient not taking: Reported on 03/10/2016 07/12/15   Renato Shin, MD  metoprolol (LOPRESSOR) 50 MG tablet Take 1 tablet (50 mg total) by mouth 2 (two) times daily. 07/27/15   Renato Shin, MD  omeprazole (PRILOSEC) 20 MG capsule TAKE ONE CAPSULE BY MOUTH ONCE DAILY 02/12/16   Renato Shin, MD  traZODone (DESYREL) 150 MG tablet TAKE ONE TABLET BY MOUTH AT BEDTIME 03/12/15   Renato Shin, MD    Family History Family History  Problem Relation Age of Onset  . Cancer Father   . Hypertension Father   . Heart disease Mother   . Heart disease Sister   . Kidney failure Sister   . Heart disease Sister   . Arrhythmia Sister   . Heart attack Maternal Grandmother   . Stroke Neg Hx   . Colon cancer Neg Hx     Social History Social History  Substance Use Topics  . Smoking status: Former Smoker    Packs/day: 2.00    Years: 50.00    Types: Cigarettes    Quit date: 03/17/2014  . Smokeless tobacco: Never Used     Comment: none  . Alcohol use No     Comment: none per pt.     Allergies   Patient has no known allergies.   Review of Systems Review of Systems  All other systems reviewed and are negative.    Physical Exam Triage Vital Signs ED Triage Vitals  Enc Vitals Group     BP 04/15/16 1709 (!) 174/93     Pulse Rate 04/15/16 1709 67     Resp 04/15/16 1709 18     Temp 04/15/16 1709 97.8 F (36.6 C)     Temp Source 04/15/16 1709 Oral     SpO2 04/15/16 1709 98 %     Weight --      Height --      Pain Score 04/15/16 1708 5     Pain Loc --    Updated Vital Signs BP (!) 174/93   Pulse 67   Temp 97.8 F (36.6 C) (Oral)   Resp 18   SpO2 98%   Physical Exam  Constitutional: He is oriented to person, place, and time. No distress.  Alert, nicely groomed  HENT:  Head: Atraumatic.  Eyes:  Conjugate gaze, no eye redness/drainage  Neck: Neck supple.  Cardiovascular: Normal rate.   Pulmonary/Chest: No  respiratory distress.  Abdominal: He exhibits no distension.  Musculoskeletal: Normal range of motion.  Neurological: He is alert and oriented to person, place, and time.  Skin: Skin is warm and dry.  Dorsal right 3rd PIP with 71mm abrasion, not able to express any material.  Good ROM right 3rd finger.  Slightly red around wound.  Slightly tender.  Nursing note and vitals reviewed.    UC Treatments / Results   Procedures Procedures (including critical care time) None today  Final Clinical Impressions(s) / UC Diagnoses   Final diagnoses:  Infected abrasion of finger of right hand, initial encounter   Wash finger wound 1-2 times daily, with soap/water gently, and put antibiotic ointment and bandaid on it, until healed.  Prescription for doxycycline (antibiotic) sent to the [facility] on [address].  Recheck if not starting to improve in a few days.  New Prescriptions New Prescriptions   DOXYCYCLINE (VIBRAMYCIN) 100 MG CAPSULE    Take 1 capsule (100 mg total) by mouth 2 (two) times daily.     Sherlene Shams, MD 04/15/16 2240

## 2016-04-15 NOTE — ED Triage Notes (Signed)
Pt sts that he hit right middle finger on a pipe a week ago and now he thinks it may be infected.

## 2016-04-16 IMAGING — CR DG CHEST 2V
2 series · 2 of 2 positions shown · non-contrast
Comparison: 11/30/2009.

CLINICAL DATA: Diabetes.  Hypertension.

EXAM:
CHEST  2 VIEW

[w chest pa]
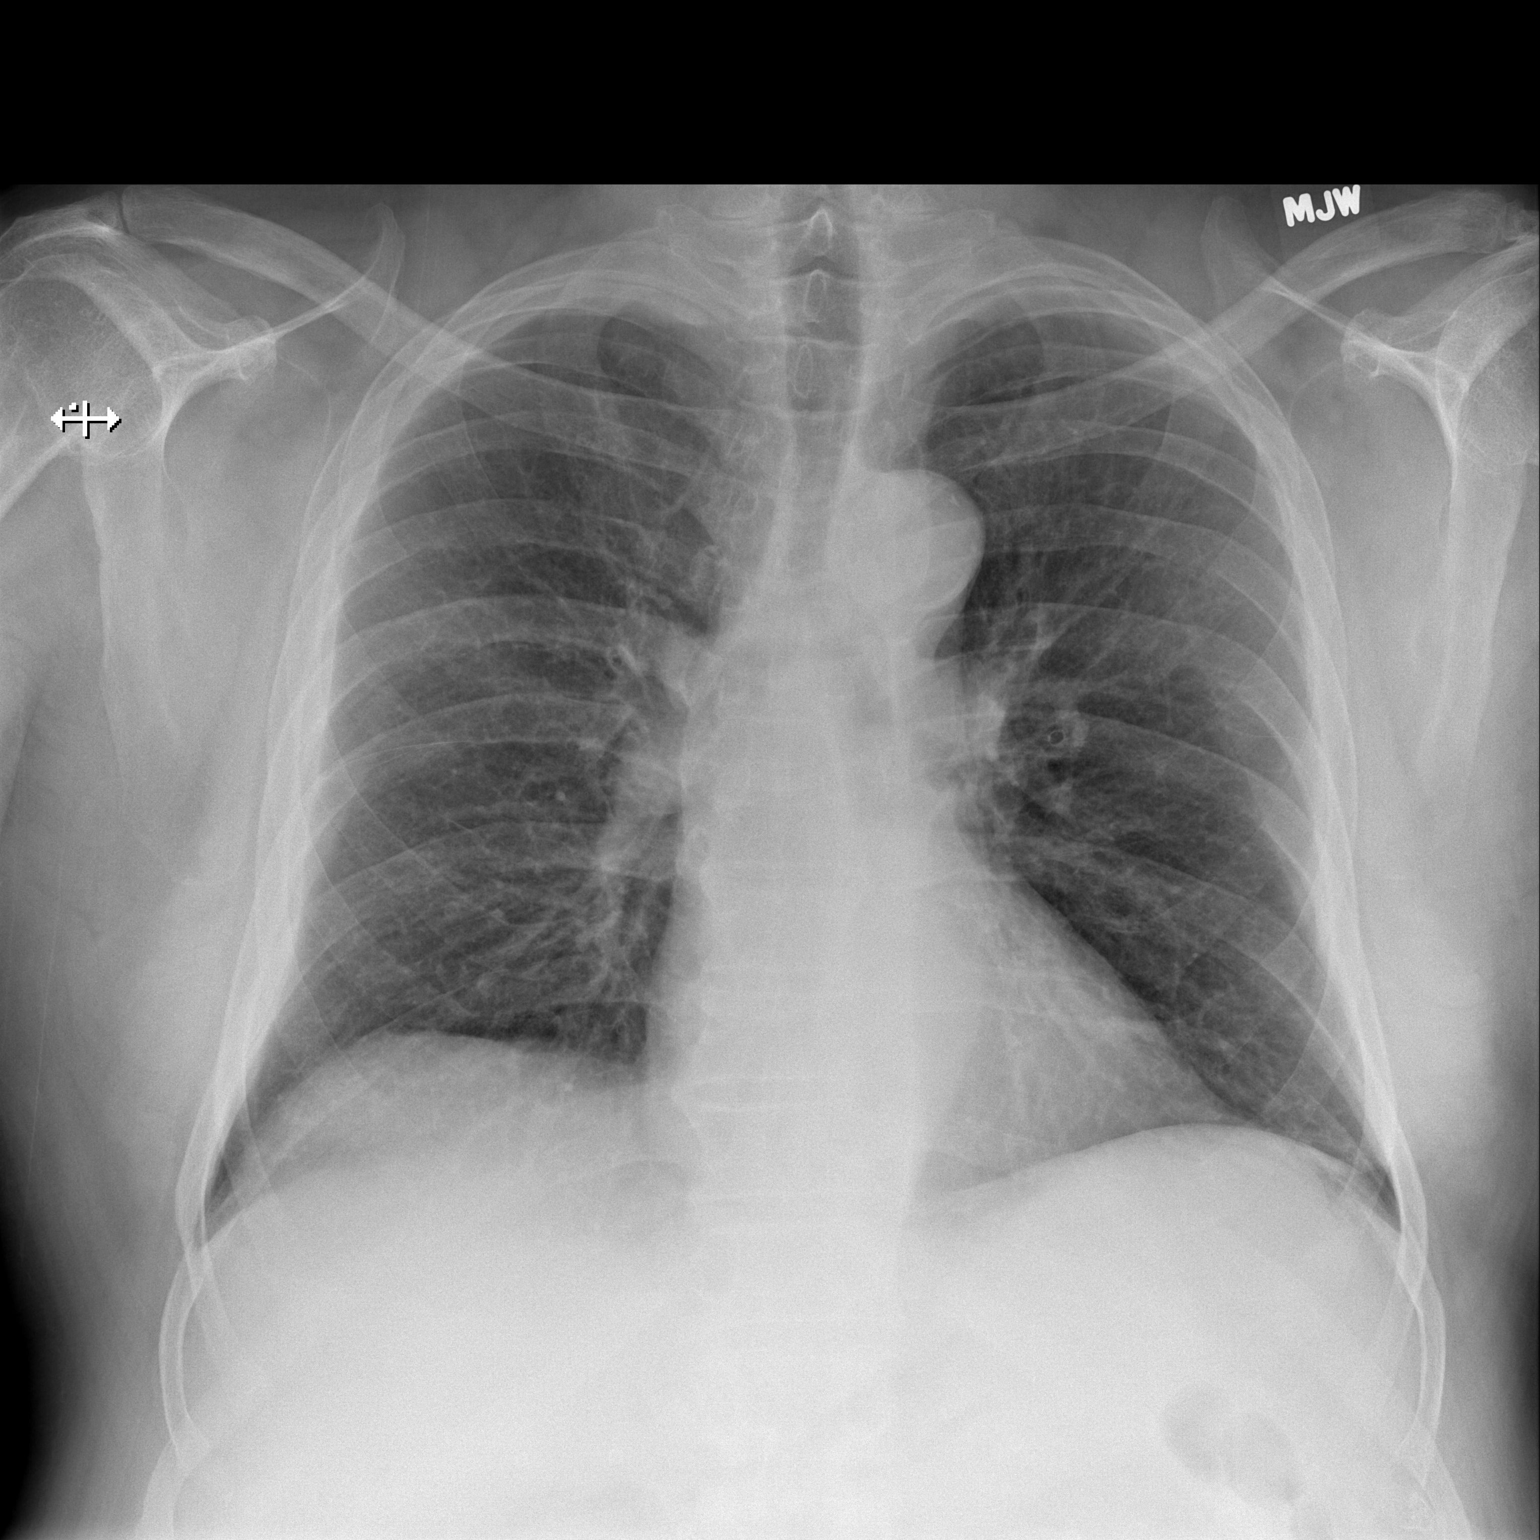

[w chest lat]
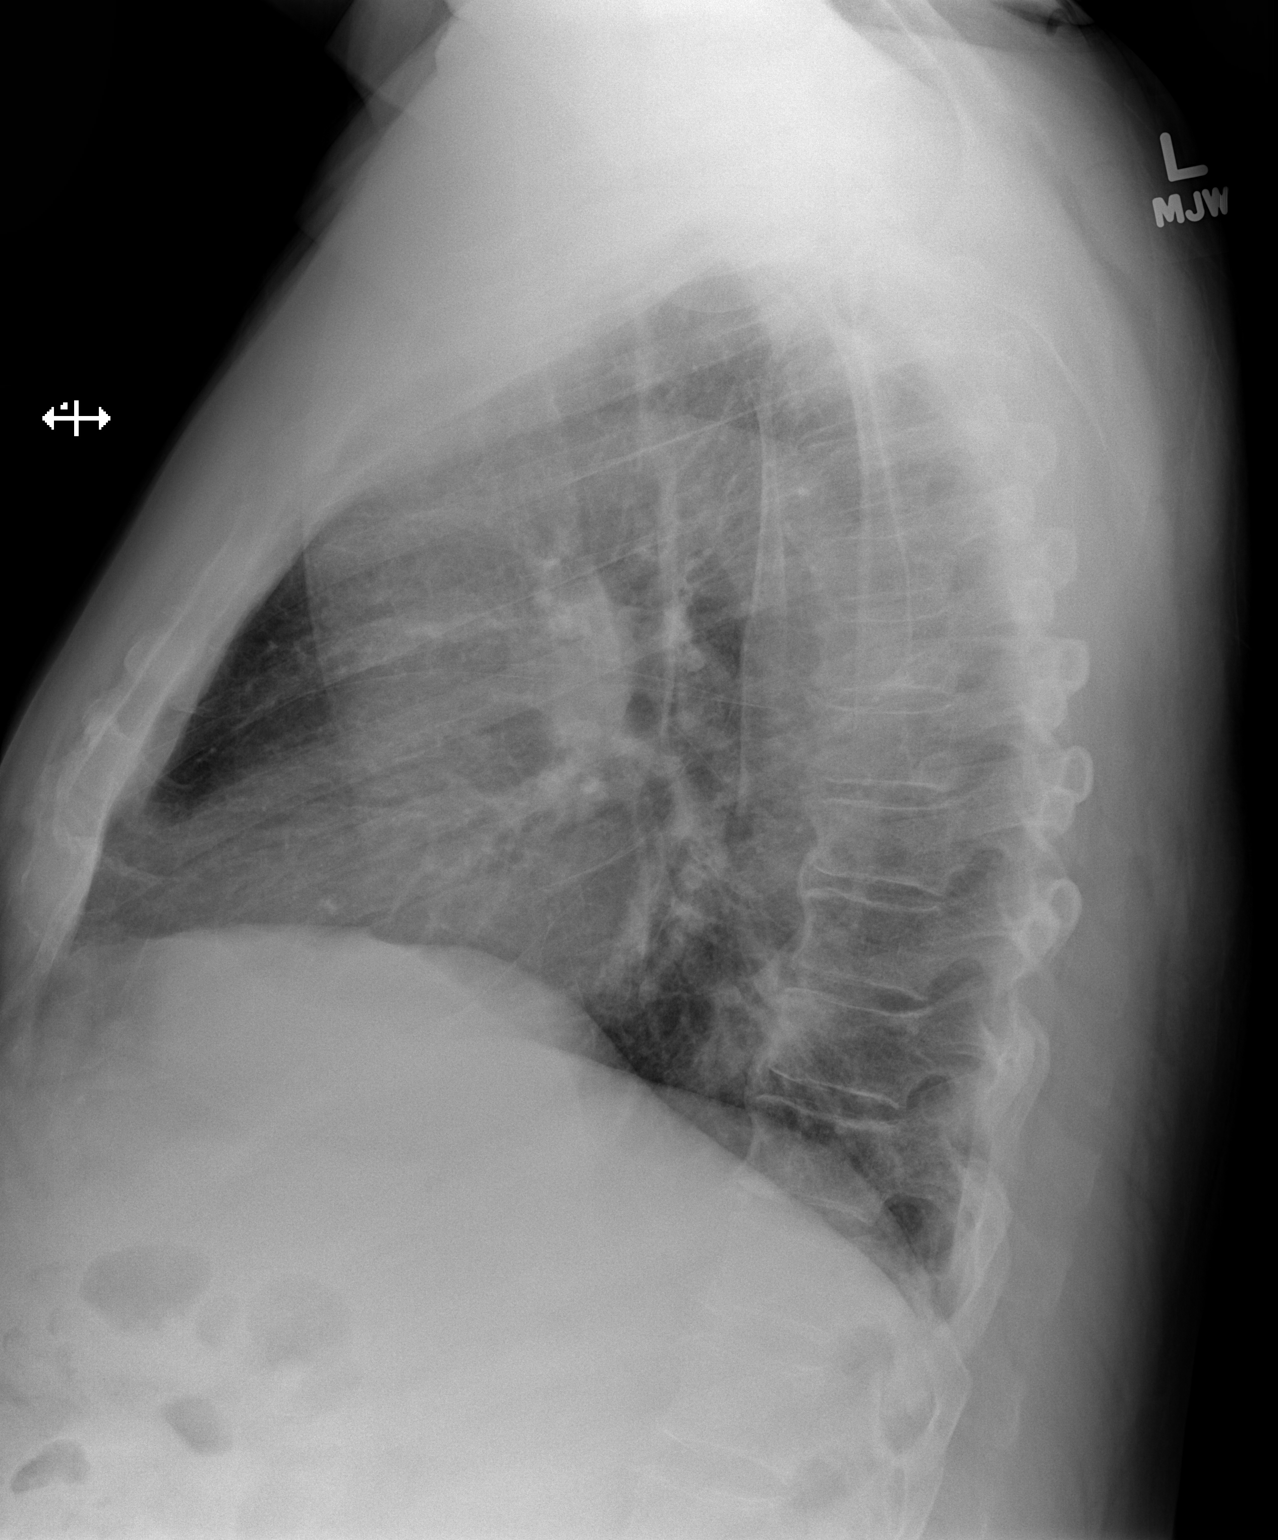

[2 of 2 positions shown; findings below may reference images not displayed]

FINDINGS: Mediastinum and hilar structures are normal. Lungs are clear. Heart
size normal. No pleural effusion or pneumothorax. Degenerative
changes thoracic spine.
IMPRESSION: No acute cardiopulmonary disease.

## 2016-04-20 IMAGING — CR DG CHEST 1V PORT
1 series · 1 of 1 positions shown · non-contrast
Comparison: 03/22/2013

CLINICAL DATA: Status post coronary bypass grafting

EXAM:
PORTABLE CHEST - 1 VIEW

[portable]
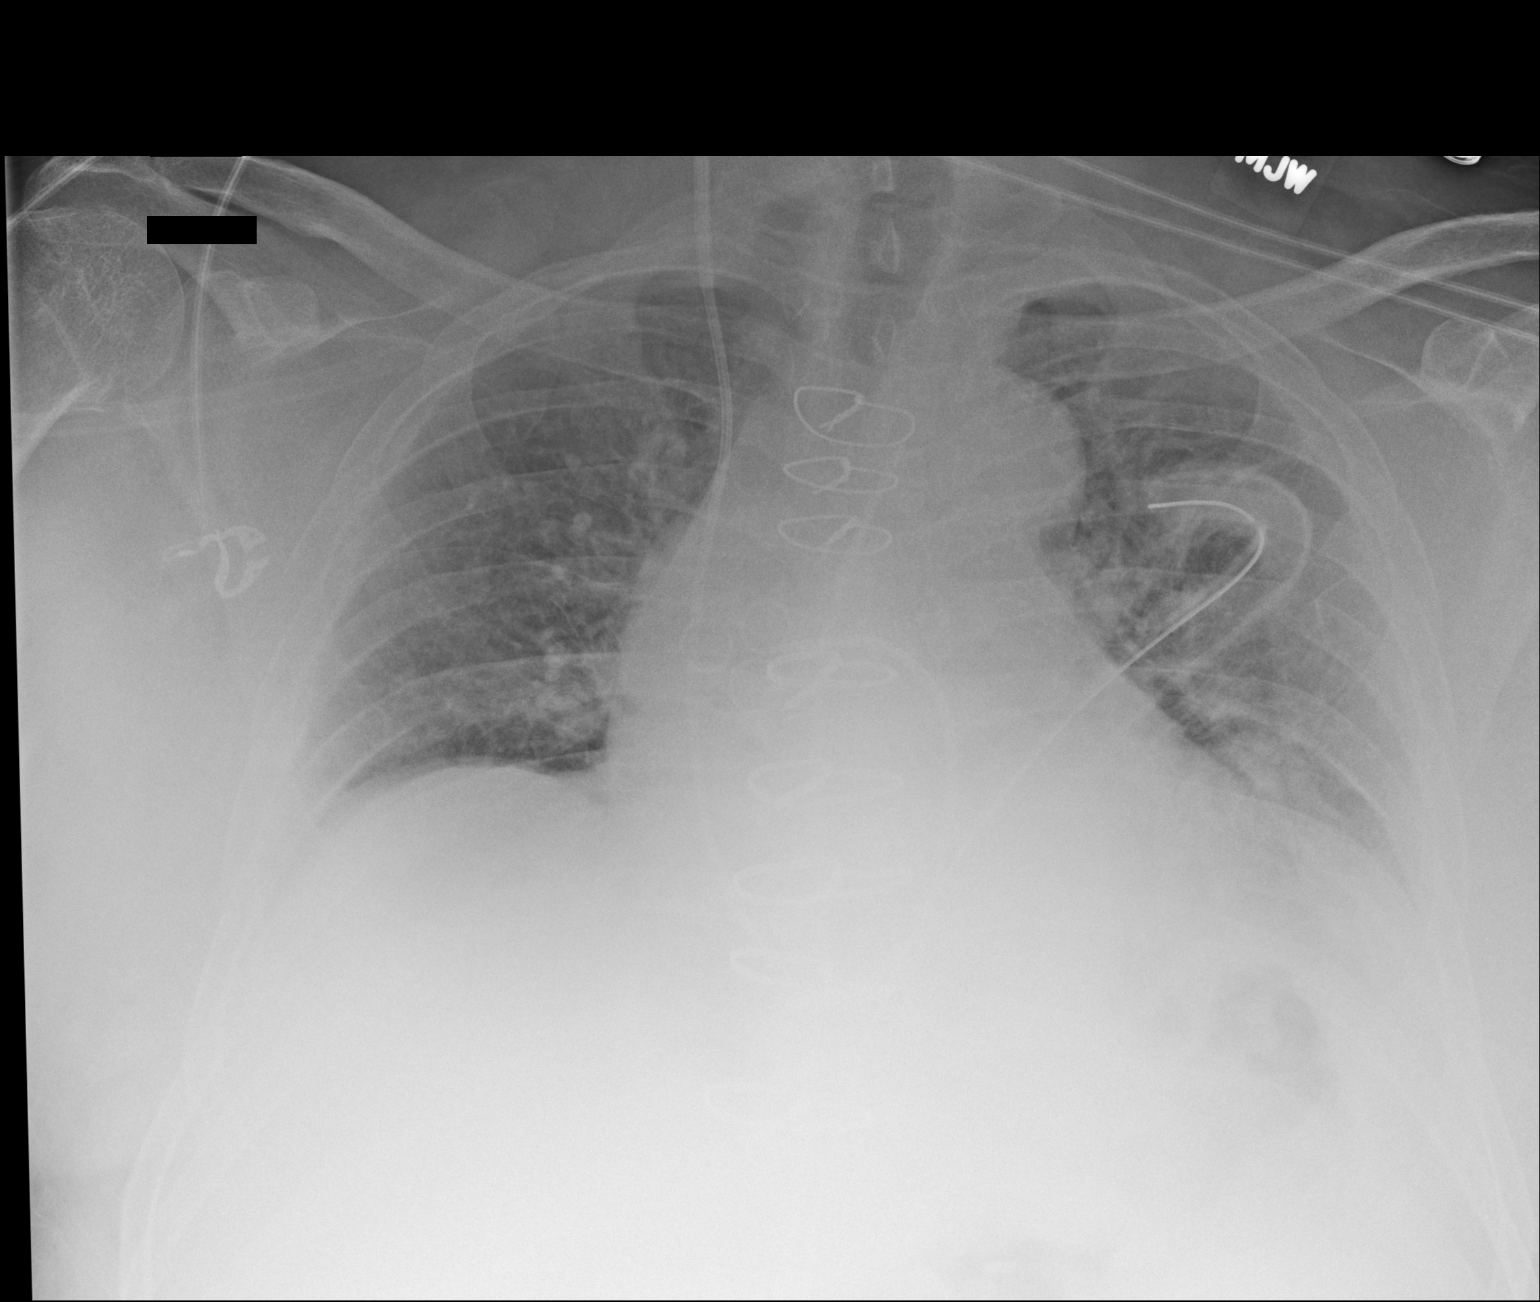

[1 of 1 positions shown; findings below may reference images not displayed]

FINDINGS: The endotracheal tube and nasogastric catheter have been removed in
the interval. A mediastinal drain, Swan-Ganz catheter and left
thoracostomy catheter remain in place. Cardiac shadow is stable. The
overall inspiratory effort is poor with vascular crowding although
no focal infiltrate is seen. No pneumothorax is noted.
IMPRESSION: Tubes and lines as described above.  No pneumothorax is seen.

## 2016-04-22 ENCOUNTER — Other Ambulatory Visit: Payer: Self-pay | Admitting: Endocrinology

## 2016-04-22 IMAGING — DX DG ABDOMEN 2V
2 series · 2 of 2 positions shown · non-contrast
Comparison: March 10, 2007

CLINICAL DATA: Postoperative ileus

EXAM:
ABDOMEN - 2 VIEW

[abdomen erect]
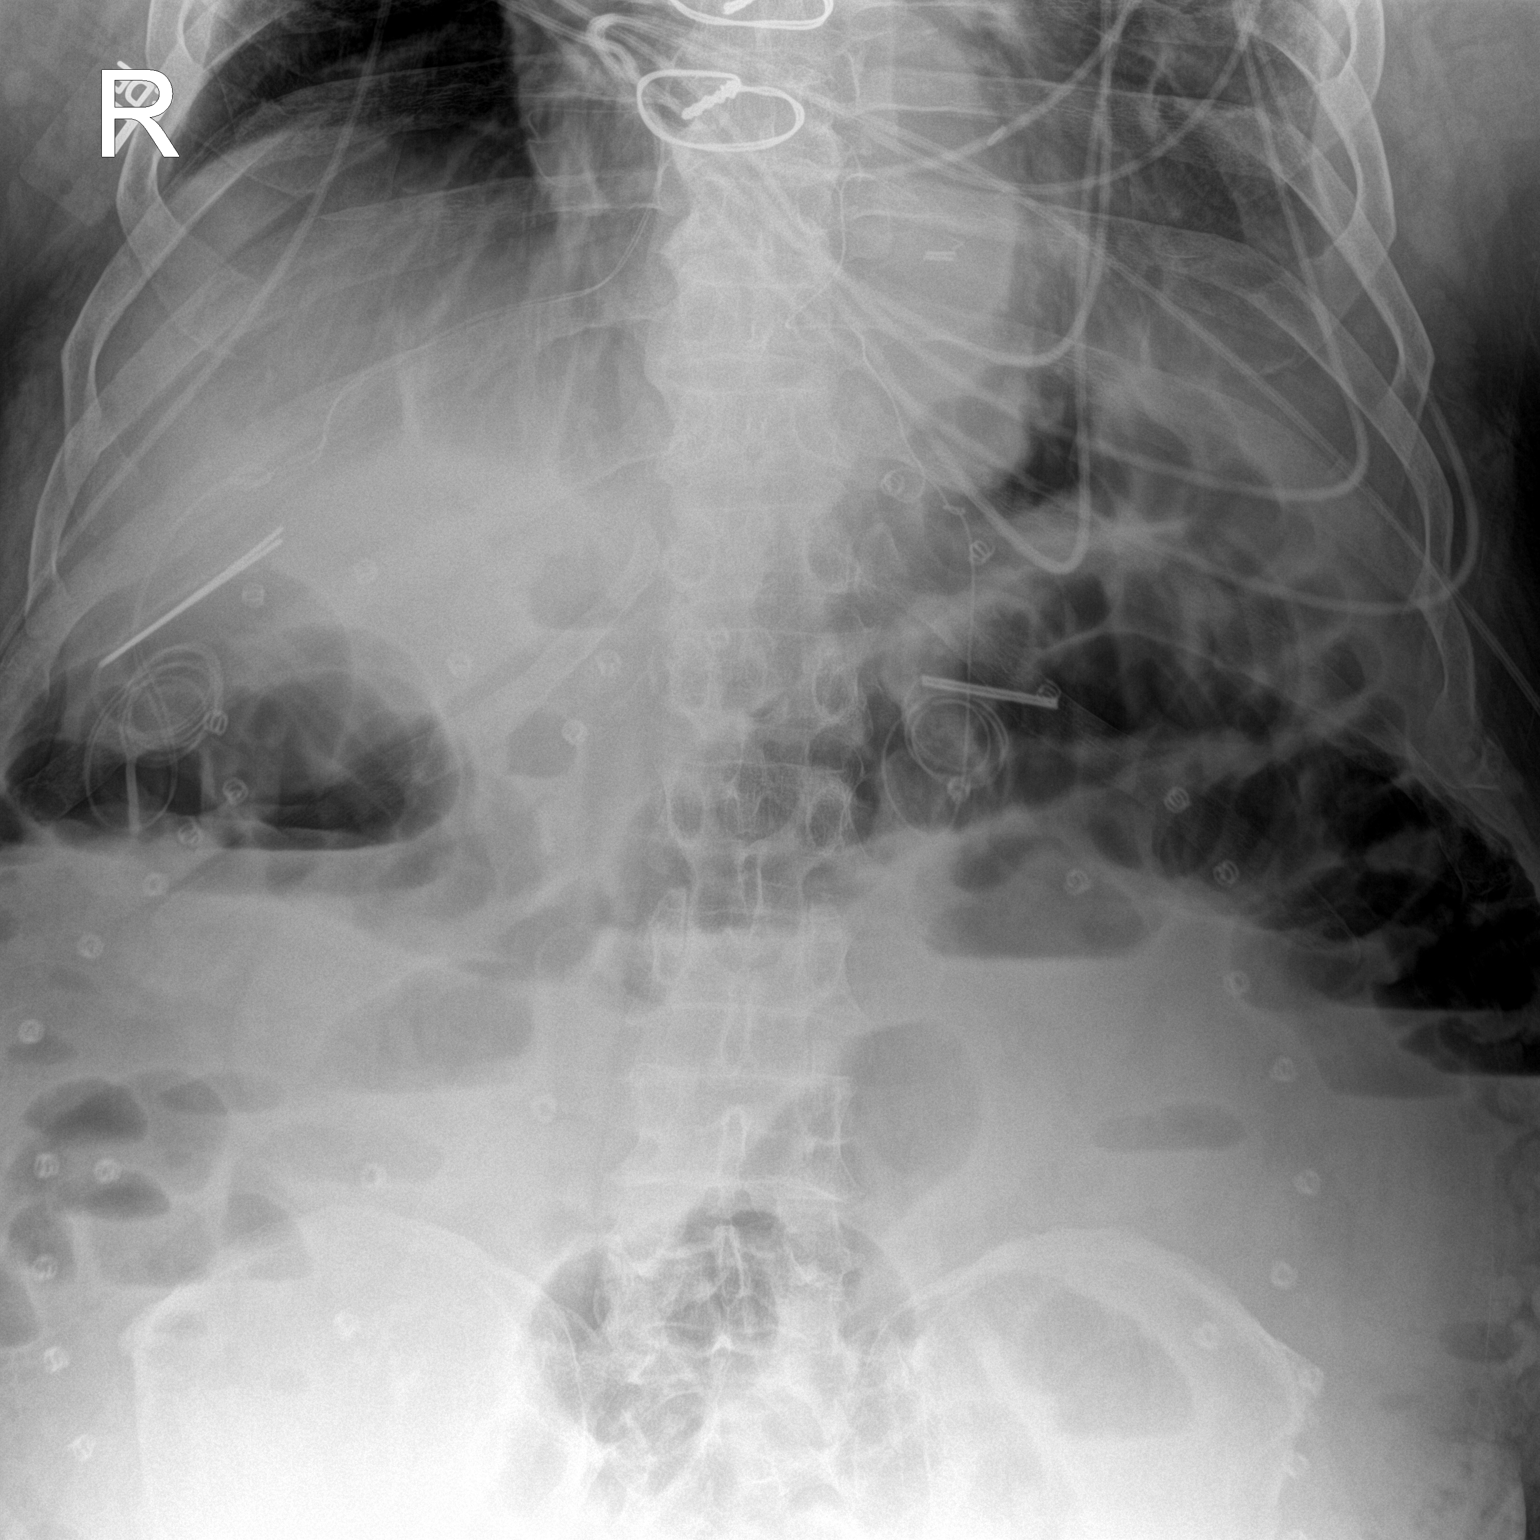

[abdomen supine]
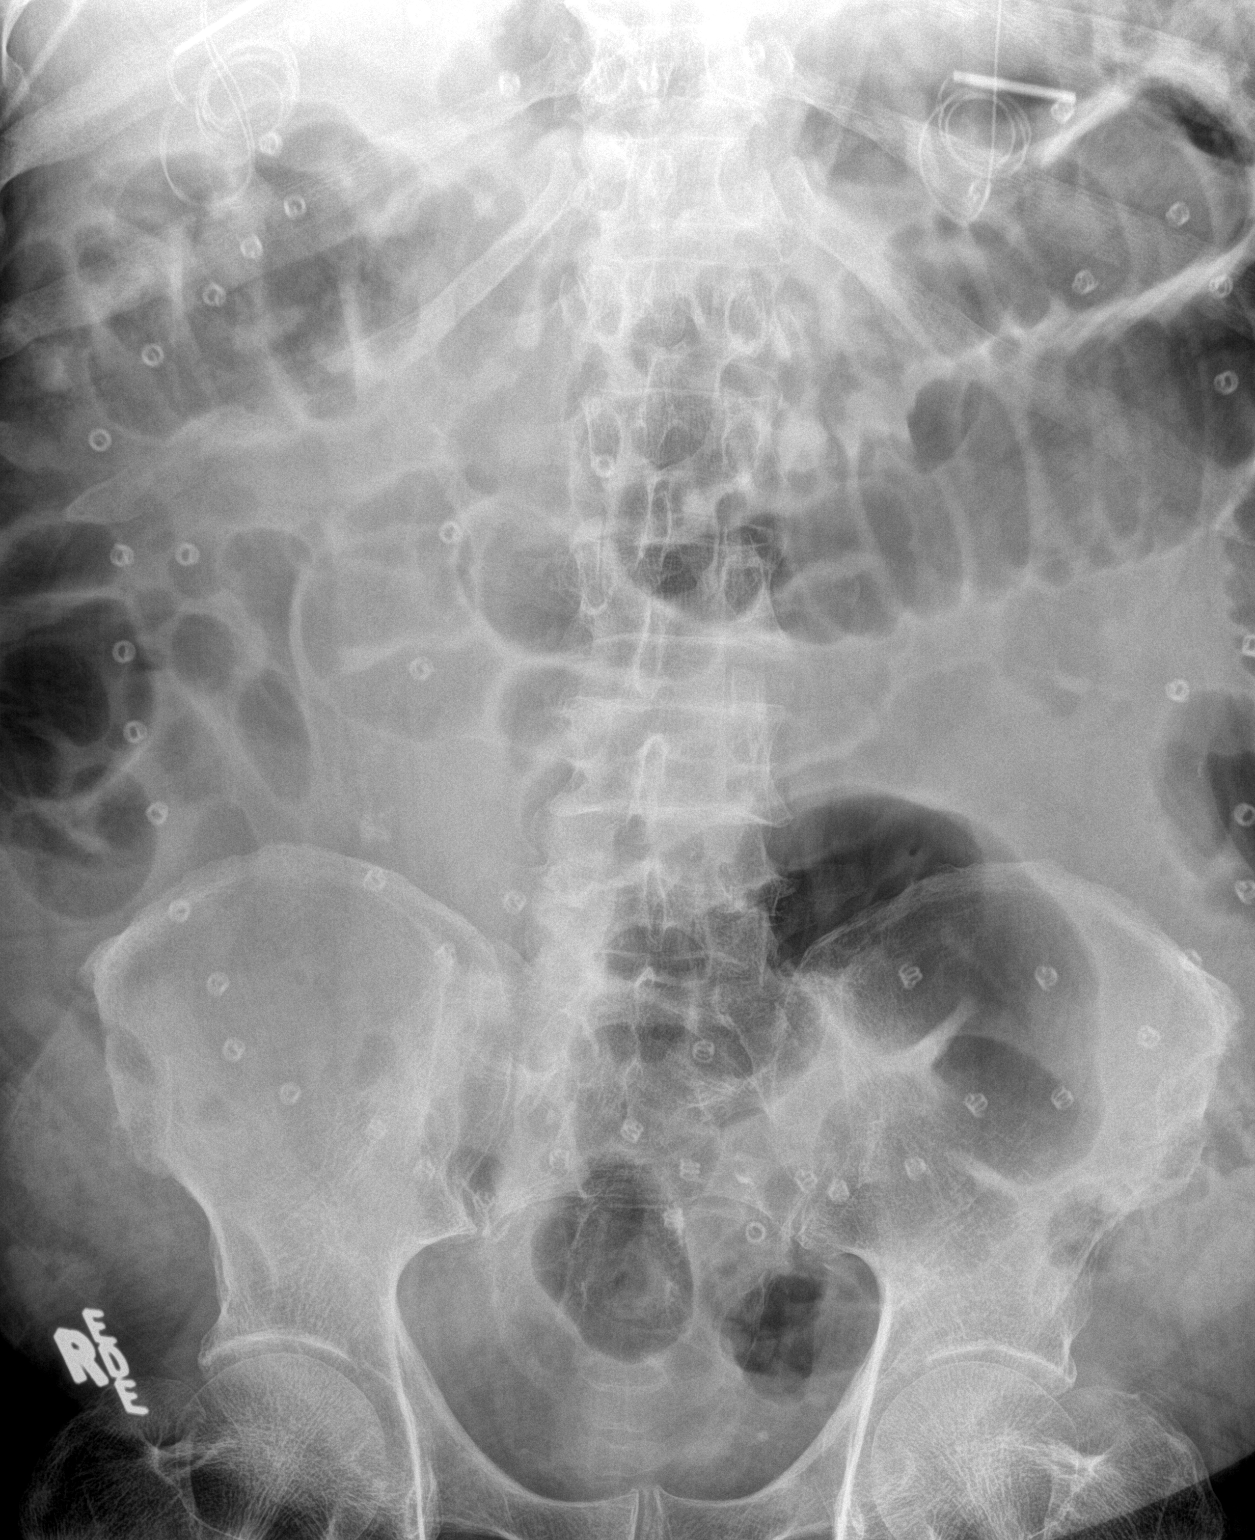

[2 of 2 positions shown; findings below may reference images not displayed]

FINDINGS: Supine and upright images were obtained. There are multiple loops of
dilated small bowel with scattered air-fluid levels. Suspect ileus,
although a degree of obstruction cannot be entirely excluded. Note
that there is air in the colon. There are multiple coils throughout
the abdomen and pelvis. Pacemaker wires are attached to the right
heart. No free air.
IMPRESSION: The bowel gas pattern is consistent with ileus, although a degree of
obstruction cannot be excluded. No free air. Note that air is seen
in the colon.

## 2016-05-14 ENCOUNTER — Other Ambulatory Visit: Payer: Self-pay | Admitting: Endocrinology

## 2016-05-15 ENCOUNTER — Other Ambulatory Visit: Payer: Self-pay

## 2016-05-15 MED ORDER — ATORVASTATIN CALCIUM 80 MG PO TABS: 80.0000 mg | ORAL_TABLET | Freq: Every day | ORAL | 1 refills | Status: DC

## 2016-05-27 ENCOUNTER — Other Ambulatory Visit: Payer: Self-pay | Admitting: Endocrinology

## 2016-06-09 ENCOUNTER — Encounter: Payer: Self-pay | Admitting: Endocrinology

## 2016-06-09 ENCOUNTER — Ambulatory Visit (INDEPENDENT_AMBULATORY_CARE_PROVIDER_SITE_OTHER): Payer: Medicare HMO | Admitting: Endocrinology

## 2016-06-09 ENCOUNTER — Other Ambulatory Visit (INDEPENDENT_AMBULATORY_CARE_PROVIDER_SITE_OTHER): Payer: Medicare HMO

## 2016-06-09 VITALS — BP 148/86 | HR 79 | Ht 71.0 in | Wt 236.0 lb

## 2016-06-09 DIAGNOSIS — N529 Male erectile dysfunction, unspecified: Secondary | ICD-10-CM | POA: Diagnosis not present

## 2016-06-09 DIAGNOSIS — E538 Deficiency of other specified B group vitamins: Secondary | ICD-10-CM

## 2016-06-09 DIAGNOSIS — E1159 Type 2 diabetes mellitus with other circulatory complications: Secondary | ICD-10-CM | POA: Diagnosis not present

## 2016-06-09 DIAGNOSIS — R972 Elevated prostate specific antigen [PSA]: Secondary | ICD-10-CM

## 2016-06-09 DIAGNOSIS — Z951 Presence of aortocoronary bypass graft: Secondary | ICD-10-CM

## 2016-06-09 DIAGNOSIS — Z0000 Encounter for general adult medical examination without abnormal findings: Secondary | ICD-10-CM

## 2016-06-09 LAB — HEPATIC FUNCTION PANEL
ALBUMIN: 4.1 g/dL (ref 3.5–5.2)
ALT: 16 U/L (ref 0–53)
AST: 16 U/L (ref 0–37)
Alkaline Phosphatase: 104 U/L (ref 39–117)
BILIRUBIN TOTAL: 0.7 mg/dL (ref 0.2–1.2)
Bilirubin, Direct: 0.2 mg/dL (ref 0.0–0.3)
TOTAL PROTEIN: 6.6 g/dL (ref 6.0–8.3)

## 2016-06-09 LAB — CBC WITH DIFFERENTIAL/PLATELET
BASOS ABS: 0.1 10*3/uL (ref 0.0–0.1)
BASOS PCT: 1.1 % (ref 0.0–3.0)
EOS ABS: 0.3 10*3/uL (ref 0.0–0.7)
Eosinophils Relative: 3.7 % (ref 0.0–5.0)
HEMATOCRIT: 39.9 % (ref 39.0–52.0)
HEMOGLOBIN: 13.1 g/dL (ref 13.0–17.0)
LYMPHS PCT: 15.7 % (ref 12.0–46.0)
Lymphs Abs: 1.4 10*3/uL (ref 0.7–4.0)
MCHC: 32.8 g/dL (ref 30.0–36.0)
MCV: 77.3 fl — ABNORMAL LOW (ref 78.0–100.0)
MONO ABS: 0.9 10*3/uL (ref 0.1–1.0)
Monocytes Relative: 10 % (ref 3.0–12.0)
Neutro Abs: 6 10*3/uL (ref 1.4–7.7)
Neutrophils Relative %: 69.5 % (ref 43.0–77.0)
Platelets: 185 10*3/uL (ref 150.0–400.0)
RBC: 5.16 Mil/uL (ref 4.22–5.81)
RDW: 16.7 % — AB (ref 11.5–15.5)
WBC: 8.6 10*3/uL (ref 4.0–10.5)

## 2016-06-09 LAB — MICROALBUMIN / CREATININE URINE RATIO
CREATININE, U: 208.6 mg/dL
MICROALB/CREAT RATIO: 48.9 mg/g — AB (ref 0.0–30.0)
Microalb, Ur: 102.1 mg/dL — ABNORMAL HIGH (ref 0.0–1.9)

## 2016-06-09 LAB — URINALYSIS, ROUTINE W REFLEX MICROSCOPIC
BILIRUBIN URINE: NEGATIVE
HGB URINE DIPSTICK: NEGATIVE
KETONES UR: NEGATIVE
LEUKOCYTES UA: NEGATIVE
NITRITE: NEGATIVE
PH: 6 (ref 5.0–8.0)
RBC / HPF: NONE SEEN (ref 0–?)
Specific Gravity, Urine: 1.025 (ref 1.000–1.030)
Total Protein, Urine: 100 — AB
UROBILINOGEN UA: 0.2 (ref 0.0–1.0)
Urine Glucose: NEGATIVE
WBC UA: NONE SEEN (ref 0–?)

## 2016-06-09 LAB — LIPID PANEL
CHOL/HDL RATIO: 3
Cholesterol: 136 mg/dL (ref 0–200)
HDL: 42.7 mg/dL (ref >39.00)
LDL Cholesterol: 83 mg/dL (ref 0–99)
NonHDL: 93.17
TRIGLYCERIDES: 49 mg/dL (ref 0.0–149.0)
VLDL: 9.8 mg/dL (ref 0.0–40.0)

## 2016-06-09 LAB — BASIC METABOLIC PANEL
BUN: 17 mg/dL (ref 6–23)
CALCIUM: 9.1 mg/dL (ref 8.4–10.5)
CHLORIDE: 105 meq/L (ref 96–112)
CO2: 30 mEq/L (ref 19–32)
CREATININE: 1.31 mg/dL (ref 0.40–1.50)
GFR: 69.92 mL/min (ref >60.00)
Glucose, Bld: 97 mg/dL (ref 70–99)
Potassium: 4.2 mEq/L (ref 3.5–5.1)
SODIUM: 141 meq/L (ref 135–145)

## 2016-06-09 LAB — TSH: TSH: 2.03 u[IU]/mL (ref 0.35–4.50)

## 2016-06-09 LAB — HEPATITIS C ANTIBODY: HCV AB: NEGATIVE

## 2016-06-09 LAB — PSA: PSA: 4.78 ng/mL — AB (ref 0.10–4.00)

## 2016-06-09 LAB — POCT GLYCOSYLATED HEMOGLOBIN (HGB A1C): Hemoglobin A1C: 5.7

## 2016-06-09 MED ORDER — CYANOCOBALAMIN 1000 MCG/ML IJ SOLN
1000.0000 ug | Freq: Once | INTRAMUSCULAR | Status: AC
  Administered 2016-06-09: 1000 ug via INTRAMUSCULAR

## 2016-06-09 NOTE — Progress Notes (Signed)
Subjective:    Patient ID: Shane [name], male    DOB: [birth year], 66 y.o.   MRN: [id number]  HPI Pt is here for regular wellness examination, and is feeling pretty well in general, and says chronic med probs are stable, except as noted below Past Medical History:  Diagnosis Date  . Asthma    PMH:as a child  . Carotid stenosis    a. Carotid US 3/16:  bilat ICA 1-39%  . CIGARETTE SMOKER 11/30/2009   Qualifier: Diagnosis of  By: Loanne Drilling MD, Jacelyn Pi   . Colon polyps 06/17/2011  . Coronary artery disease    a. Myoview 3/16:  Inf ischemia, EF 48%, int risk;  b.  LHC 3/16:  3 v CAD,  c.  s/p CABG x 4 03/2014  . DIABETES MELLITUS, TYPE II 09/25/2006  . Diverticulosis 06/17/2011  . GERD (gastroesophageal reflux disease)   . Hx of cardiovascular stress test    Lexiscan Myoview 3/16:  Inferior ischemia, EF 48%, Intermediate Risk  . Hx of echocardiogram    a.  Echo 3/16:  EF 50-55%, Gr 1 DD  . HYPERCHOLESTEROLEMIA   . HYPERTENSION   . INSOMNIA 09/10/2008  . PSA, INCREASED 11/30/2009  . RENAL INSUFFICIENCY 09/25/2006  . Tinnitus   . Wears glasses     Past Surgical History:  Procedure Laterality Date  . CATARACT EXTRACTION W/ INTRAOCULAR LENS  IMPLANT, BILATERAL    . COLONOSCOPY    . CORONARY ARTERY BYPASS GRAFT N/A 03/23/2014   Procedure: CORONARY ARTERY BYPASS GRAFTING (CABG), ON PUMP, TIMES FOUR, USING LEFT INTERNAL MAMMARY ARTERY, BILATERAL GREATER SAPHENOUS VEINS HARVESTED ENDOSCOPICALLY;  Surgeon: Ivin Poot, MD;  Location: [place];  Service: Open Heart Surgery;  Laterality: N/A;  . HERNIA REPAIR    . LEFT HEART CATHETERIZATION WITH CORONARY ANGIOGRAM N/A 03/18/2014   Procedure: LEFT HEART CATHETERIZATION WITH CORONARY ANGIOGRAM;  Surgeon: [name], MD;  Location: [facility] CATH LAB;  Service: Cardiovascular;  Laterality: N/A;  . POLYPECTOMY    . TEE WITHOUT CARDIOVERSION N/A 03/23/2014   Procedure: TRANSESOPHAGEAL ECHOCARDIOGRAM (TEE);  Surgeon: Ivin Poot, MD;  Location: [place];   Service: Open Heart Surgery;  Laterality: N/A;  . TONSILLECTOMY      Social History   Social History  . Marital status: Married    Spouse name: N/A  . Number of children: N/A  . Years of education: N/A   Occupational History  . [employer]    Social History Main Topics  . Smoking status: Former Smoker    Packs/day: 2.00    Years: 50.00    Types: Cigarettes    Quit date: 03/17/2014  . Smokeless tobacco: Never Used     Comment: none  . Alcohol use No     Comment: none per pt.  . Drug use: No  . Sexual activity: Not on file   Other Topics Concern  . 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 aspirin 81 MG tablet Take 1 tablet (81 mg total) by mouth daily.    [name] Kitchen atorvastatin (LIPITOR) 80 MG tablet Take 1 tablet (80 mg total) by mouth daily. 90 tablet 1  . gabapentin (NEURONTIN) 100 MG capsule Take 1 capsule (100 mg total) by mouth at bedtime. 30 capsule 3  . lisinopril (PRINIVIL,ZESTRIL) 10 MG tablet TAKE ONE TABLET BY MOUTH  ONCE DAILY 90 tablet 3  . metoprolol (LOPRESSOR) 50 MG tablet Take 1 tablet (50 mg total) by mouth 2 (two) times daily. 60 tablet 11  . omeprazole (PRILOSEC) 20 MG capsule TAKE ONE CAPSULE BY MOUTH ONCE DAILY 90 capsule 1  . traZODone (DESYREL) 150 MG tablet TAKE ONE TABLET BY MOUTH AT BEDTIME 90 tablet 2  . metFORMIN (GLUCOPHAGE-XR) 500 MG 24 hr tablet TAKE ONE TABLET BY MOUTH ONCE DAILY (Patient not taking: Reported on 06/09/2016) 90 tablet 2   No current facility-administered medications on file prior to visit.     No Known Allergies  Family History  Problem Relation Age of Onset  . Cancer Father   . Hypertension Father   . Heart disease Mother   . Heart disease Sister   . Kidney failure Sister   . Heart disease Sister   . Arrhythmia Sister   . Heart attack Maternal Grandmother   . Stroke Neg Hx   . Colon  cancer Neg Hx     BP (!) 148/86   Pulse 79   Ht 5\' 11"  (1.803 m)   Wt 236 lb (107 kg)   SpO2 97%   BMI 32.92 kg/m    Review of Systems Constitutional: Negative for fever.  HENT: Negative for hearing loss.   Eyes: Negative for visual disturbance.  Gastrointestinal: Negative for abdominal pain.  Endocrine: Negative for cold intolerance.  Genitourinary: Negative for hematuria.  Musculoskeletal: Negative for back pain.  Skin: Negative for rash.  Allergic/Immunologic: Positive for environmental allergies.  Neurological: positive for numbness of the feet.  Hematological: Does not bruise/bleed easily.  Psychiatric/Behavioral: Negative for dysphoric mood.     Objective:   Physical Exam VS: see vs page GEN: no distress HEAD: head: no deformity eyes: no periorbital swelling, no proptosis. external nose and ears are normal mouth: no lesion seen NECK: supple, thyroid is not enlarged CHEST WALL: no deformity. LUNGS: clear to auscultation BREASTS:  No gynecomastia ABD: abdomen is soft, nontender.  no hepatosplenomegaly.  not distended.  Self-reducing ventral hernia  RECTAL/PROSTATE: declined MUSCULOSKELETAL: muscle bulk and strength are grossly normal.  no obvious joint swelling.  gait is normal and steady EXTEMITIES: no deformity.  no ulcer on the feet.  feet are of normal color and temp.  no edema PULSES: dorsalis pedis intact bilat.  no carotid bruit NEURO:  cn 2-12 grossly intact.   readily moves all 4's.  sensation is intact to touch on the feet SKIN:  Normal texture and temperature.  No rash or suspicious lesion is visible.   NODES:  None palpable at the neck PSYCH: alert, well-oriented.  Does not appear anxious nor depressed.  I personally reviewed electrocardiogram tracing (today): Indication: HTN Impression: NSR with frequent PAC s .  Poss old ASMI.  LVH Compared to 2016: these changes are new.      Assessment & Plan:  Wellness visit today, with problems stable,  except as noted.   SEPARATE EVALUATION FOLLOWS--EACH PROBLEM HERE IS NEW, NOT RESPONDING TO TREATMENT, OR POSES SIGNIFICANT RISK TO THE PATIENT'S HEALTH: HISTORY OF THE PRESENT ILLNESS: Pt has h/o CAD: denies chest pain PSA has gone high again.  Denies decreased urinary stream PAST MEDICAL HISTORY Past Medical History:  Diagnosis Date  . Asthma    PMH:as a child  . Carotid stenosis    a. Carotid US 3/16:  bilat ICA 1-39%  . CIGARETTE SMOKER 11/30/2009   Qualifier: Diagnosis of  By: Loanne Drilling MD, Jacelyn Pi   .  Colon polyps 06/17/2011  . Coronary artery disease    a. Myoview 3/16:  Inf ischemia, EF 48%, int risk;  b.  LHC 3/16:  3 v CAD,  c.  s/p CABG x 4 03/2014  . DIABETES MELLITUS, TYPE II 09/25/2006  . Diverticulosis 06/17/2011  . GERD (gastroesophageal reflux disease)   . Hx of cardiovascular stress test    Lexiscan Myoview 3/16:  Inferior ischemia, EF 48%, Intermediate Risk  . Hx of echocardiogram    a.  Echo 3/16:  EF 50-55%, Gr 1 DD  . HYPERCHOLESTEROLEMIA   . HYPERTENSION   . INSOMNIA 09/10/2008  . PSA, INCREASED 11/30/2009  . RENAL INSUFFICIENCY 09/25/2006  . Tinnitus   . Wears glasses     Past Surgical History:  Procedure Laterality Date  . CATARACT EXTRACTION W/ INTRAOCULAR LENS  IMPLANT, BILATERAL    . COLONOSCOPY    . CORONARY ARTERY BYPASS GRAFT N/A 03/23/2014   Procedure: CORONARY ARTERY BYPASS GRAFTING (CABG), ON PUMP, TIMES FOUR, USING LEFT INTERNAL MAMMARY ARTERY, BILATERAL GREATER SAPHENOUS VEINS HARVESTED ENDOSCOPICALLY;  Surgeon: Ivin Poot, MD;  Location: [place];  Service: Open Heart Surgery;  Laterality: N/A;  . HERNIA REPAIR    . LEFT HEART CATHETERIZATION WITH CORONARY ANGIOGRAM N/A 03/18/2014   Procedure: LEFT HEART CATHETERIZATION WITH CORONARY ANGIOGRAM;  Surgeon: [name], MD;  Location: [facility] CATH LAB;  Service: Cardiovascular;  Laterality: N/A;  . POLYPECTOMY    . TEE WITHOUT CARDIOVERSION N/A 03/23/2014   Procedure: TRANSESOPHAGEAL ECHOCARDIOGRAM  (TEE);  Surgeon: Ivin Poot, MD;  Location: [place];  Service: Open Heart Surgery;  Laterality: N/A;  . TONSILLECTOMY      Social History   Social History  . Marital status: Married    Spouse name: N/A  . Number of children: N/A  . Years of education: N/A   Occupational History  . [employer]    Social History Main Topics  . Smoking status: Former Smoker    Packs/day: 2.00    Years: 50.00    Types: Cigarettes    Quit date: 03/17/2014  . Smokeless tobacco: Never Used     Comment: none  . Alcohol use No     Comment: none per pt.  . Drug use: No  . Sexual activity: Not on file   Other Topics Concern  . 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 aspirin 81 MG tablet Take 1 tablet (81 mg total) by mouth daily.    [name] Kitchen atorvastatin (LIPITOR) 80 MG tablet Take 1 tablet (80 mg total) by mouth daily. 90 tablet 1  . gabapentin (NEURONTIN) 100 MG capsule Take 1 capsule (100 mg total) by mouth at bedtime. 30 capsule 3  . lisinopril (PRINIVIL,ZESTRIL) 10 MG tablet TAKE ONE TABLET BY MOUTH ONCE DAILY 90 tablet 3  . metoprolol (LOPRESSOR) 50 MG tablet Take 1 tablet (50 mg total) by mouth 2 (two) times daily. 60 tablet 11  . omeprazole (PRILOSEC) 20 MG capsule TAKE ONE CAPSULE BY MOUTH ONCE DAILY 90 capsule 1  . traZODone (DESYREL) 150 MG tablet TAKE ONE TABLET BY MOUTH AT BEDTIME 90 tablet 2  . metFORMIN (GLUCOPHAGE-XR) 500 MG 24 hr tablet TAKE ONE TABLET BY MOUTH ONCE DAILY (Patient not taking: Reported on 06/09/2016) 90 tablet 2   No current facility-administered medications on file prior to visit.  No Known Allergies  Family History  Problem Relation Age of Onset  . Cancer Father   . Hypertension Father   . Heart disease Mother   . Heart disease Sister   . Kidney failure Sister   . Heart disease Sister   . Arrhythmia Sister   . Heart  attack Maternal Grandmother   . Stroke Neg Hx   . Colon cancer Neg Hx     BP (!) 148/86   Pulse 79   Ht 5\' 11"  (1.803 m)   Wt 236 lb (107 kg)   SpO2 97%   BMI 32.92 kg/m   REVIEW OF SYSTEMS: Denies sob PHYSICAL EXAMINATION: VITAL SIGNS:  See vs page GENERAL: no distress HEART:  Regular rate and rhythm without murmurs noted. Normal S1,S2.   LAB/XRAY RESULTS: Lab Results  Component Value Date   PSA 4.78 (H) 06/09/2016   PSA 2.71 03/12/2015   PSA 5.53 (H) 02/10/2013  IMPRESSION: elev PSA, recurrent.  CAD: stable PLAN:  Ref back to cardiol and urol

## 2016-06-09 NOTE — Patient Instructions (Addendum)
blood tests are requested for you today.  We'll let you know about the results. Please consider these measures for your health:  minimize alcohol.  Do not use tobacco products.  Have a colonoscopy at least every 10 years from age 66.  Keep firearms safely stored.  Always use seat belts.  have working smoke alarms in your home.  See an eye doctor and dentist regularly.  Never drive under the influence of alcohol or drugs (including prescription drugs).   blood tests are requested for you today.  We'll let you know about the results. Please go back to see the heart and urology specialists.  you will receive a phone call, about day and times for appointments Please return in 1 year.

## 2016-06-10 LAB — TESTOSTERONE, FREE, TOTAL, SHBG
SEX HORMONE BINDING: 60.7 nmol/L (ref 19.3–76.4)
TESTOSTERONE FREE: 11.2 pg/mL (ref 6.6–18.1)
Testosterone: 599 ng/dL (ref 264–916)

## 2016-06-13 ENCOUNTER — Encounter: Payer: Self-pay | Admitting: Physician Assistant

## 2016-06-13 ENCOUNTER — Ambulatory Visit (INDEPENDENT_AMBULATORY_CARE_PROVIDER_SITE_OTHER): Payer: Medicare HMO | Admitting: Physician Assistant

## 2016-06-13 ENCOUNTER — Encounter (INDEPENDENT_AMBULATORY_CARE_PROVIDER_SITE_OTHER): Payer: Self-pay

## 2016-06-13 VITALS — BP 160/76 | HR 59 | Ht 71.0 in | Wt 240.4 lb

## 2016-06-13 DIAGNOSIS — E7800 Pure hypercholesterolemia, unspecified: Secondary | ICD-10-CM | POA: Diagnosis not present

## 2016-06-13 DIAGNOSIS — I10 Essential (primary) hypertension: Secondary | ICD-10-CM

## 2016-06-13 DIAGNOSIS — I779 Disorder of arteries and arterioles, unspecified: Secondary | ICD-10-CM

## 2016-06-13 DIAGNOSIS — I739 Peripheral vascular disease, unspecified: Secondary | ICD-10-CM

## 2016-06-13 DIAGNOSIS — I2510 Atherosclerotic heart disease of native coronary artery without angina pectoris: Secondary | ICD-10-CM

## 2016-06-13 MED ORDER — LISINOPRIL 20 MG PO TABS: 20.0000 mg | ORAL_TABLET | Freq: Every day | ORAL | 3 refills | Status: DC

## 2016-06-13 NOTE — Patient Instructions (Signed)
Medication Instructions:  1. INCREASE LISINOPRIL TO 20 MG DAILY; NEW RX HAS BEEN SENT IN FOR THE NEW DOSE  Labwork: BMET TO BE DONE IN 2 WEEKS  Testing/Procedures: Your physician has requested that you have a carotid duplex. This test is an ultrasound of the carotid arteries in your neck. It looks at blood flow through these arteries that supply the brain with blood. Allow one hour for this exam. There are no restrictions or special instructions.    Follow-Up: 1. Your physician wants you to follow-up in: [place], [facility]  You will receive a reminder letter in the mail two months in advance. If you don't receive a letter, please call our office to schedule the follow-up appointment.  2. Your physician wants you to follow-up in: [place] will receive a reminder letter in the mail two months in advance. If you don't receive a letter, please call our office to schedule the follow-up appointment.  Any Other Special Instructions Will Be Listed Below (If Applicable). MONITOR BLOOD PRESSURE AND SEND READINGS AFTER 2-3 WEEKS     If you need a refill on your cardiac medications before your next appointment, please call your pharmacy.

## 2016-06-13 NOTE — Progress Notes (Signed)
Cardiology Office Note:    Date:  06/13/2016   ID:  Shane [name], DOB 1950-12-18, MRN [id number]  PCP:  Renato Shin, MD  Cardiologist:  Dr. [name]    Referring MD: Renato Shin, MD   Chief Complaint  Patient presents with  . Coronary Artery Disease    follow up    History of Present Illness:    Shane [name] is a 66 y.o. male with a hx of CAD status post CABG in 2016, diabetes, HTN, HL, prior tobacco abuse. He was last seen in 06/2014.    Mr. Parodi returns for cardiology follow-up.  He is here alone.  He has chronic pedal edema that is unchanged.  He has continued to refrain from smoking.  He has a chronic cough.  He denies chest pain, significant shortness of breath, syncope, dizziness, palpitations, orthopnea, PND.  He has some occ hemorrhoidal bleeding.    Prior CV studies:   The following studies were reviewed today:  Cardiac cath 03/18/14 LM: No significant stenosis. LAD:  At origin of D1 diffuse 80-90% stenosis with a plaque ulceration. D1 90% stenosis.  LCx: Proximal 75% then long 50% RCA: Mid occluded EF: 55%   Echocardiogram 03/20/14 EF 50% to 55%. Grade 1 diastolic dysfunction.   Carotid US 03/20/14 Bilateral - 1% to 39% ICA stenosis.   Myoview 03/11/14 Intermediate risk stress nuclear study with a moderate-sized and intensity, reversible inferior and inferoapical perfusion defect suggestive of ischemia.  LV Ejection Fraction: 48%.  LV Wall Motion:  Inferior hypokinesis   Past Medical History:  Diagnosis Date  . Asthma    PMH:as a child  . Carotid stenosis    a. Carotid US 3/16:  bilat ICA 1-39%  . CIGARETTE SMOKER 11/30/2009   Qualifier: Diagnosis of  By: Loanne Drilling MD, Jacelyn Pi   . Colon polyps 06/17/2011  . Coronary artery disease    a. Myoview 3/16:  Inf ischemia, EF 48%, int risk;  b.  LHC 3/16:  3 v CAD,  c.  s/p CABG x 4 03/2014  . DIABETES MELLITUS, TYPE II 09/25/2006  . Diverticulosis 06/17/2011  . GERD (gastroesophageal reflux disease)   . Hx of  cardiovascular stress test    Lexiscan Myoview 3/16:  Inferior ischemia, EF 48%, Intermediate Risk  . Hx of echocardiogram    a.  Echo 3/16:  EF 50-55%, Gr 1 DD  . HYPERCHOLESTEROLEMIA   . HYPERTENSION   . INSOMNIA 09/10/2008  . PSA, INCREASED 11/30/2009  . RENAL INSUFFICIENCY 09/25/2006  . Tinnitus   . Wears glasses     Past Surgical History:  Procedure Laterality Date  . CATARACT EXTRACTION W/ INTRAOCULAR LENS  IMPLANT, BILATERAL    . COLONOSCOPY    . CORONARY ARTERY BYPASS GRAFT N/A 03/23/2014   Procedure: CORONARY ARTERY BYPASS GRAFTING (CABG), ON PUMP, TIMES FOUR, USING LEFT INTERNAL MAMMARY ARTERY, BILATERAL GREATER SAPHENOUS VEINS HARVESTED ENDOSCOPICALLY;  Surgeon: Ivin Poot, MD;  Location: [place];  Service: Open Heart Surgery;  Laterality: N/A;  . HERNIA REPAIR    . LEFT HEART CATHETERIZATION WITH CORONARY ANGIOGRAM N/A 03/18/2014   Procedure: LEFT HEART CATHETERIZATION WITH CORONARY ANGIOGRAM;  Surgeon: [name], MD;  Location: [facility] CATH LAB;  Service: Cardiovascular;  Laterality: N/A;  . POLYPECTOMY    . TEE WITHOUT CARDIOVERSION N/A 03/23/2014   Procedure: TRANSESOPHAGEAL ECHOCARDIOGRAM (TEE);  Surgeon: Ivin Poot, MD;  Location: [place];  Service: Open Heart Surgery;  Laterality: N/A;  . TONSILLECTOMY  Current Medications: Current Meds  Medication Sig  . aspirin 81 MG tablet Take 1 tablet (81 mg total) by mouth daily.  [name] Kitchen atorvastatin (LIPITOR) 80 MG tablet Take 1 tablet (80 mg total) by mouth daily.  [name] Kitchen gabapentin (NEURONTIN) 100 MG capsule Take 1 capsule (100 mg total) by mouth at bedtime.  . metoprolol (LOPRESSOR) 50 MG tablet Take 1 tablet (50 mg total) by mouth 2 (two) times daily.  [name] Kitchen omeprazole (PRILOSEC) 20 MG capsule TAKE ONE CAPSULE BY MOUTH ONCE DAILY  . traZODone (DESYREL) 150 MG tablet TAKE ONE TABLET BY MOUTH AT BEDTIME AS NEEDED FOR SLEEP  . [DISCONTINUED] lisinopril (PRINIVIL,ZESTRIL) 10 MG tablet TAKE ONE TABLET BY MOUTH ONCE DAILY      Allergies:   Patient has no known allergies.   Social History   Social History  . Marital status: Married    Spouse name: N/A  . Number of children: N/A  . Years of education: N/A   Occupational History  . [employer]    Social History Main Topics  . Smoking status: Former Smoker    Packs/day: 2.00    Years: 50.00    Types: Cigarettes    Quit date: 03/17/2014  . Smokeless tobacco: Never Used     Comment: none  . Alcohol use No     Comment: none per pt.  . Drug use: No  . Sexual activity: Not Asked   Other Topics Concern  . 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 There is no history of Stroke or Colon cancer.  ROS:   Please see the history of present illness.    Review of Systems  Cardiovascular: Positive for leg swelling.  Respiratory: Positive for cough.   Musculoskeletal: Positive for joint pain.  Gastrointestinal: Positive for hematochezia.   All other systems reviewed and are negative.   EKGs/Labs/Other Test Reviewed:    EKG:  EKG is  ordered today.  The ekg ordered today demonstrates NSR, HR 59, PACs in a bigeminal pattern, QTc 423 ms, no sig change since 06/10/14  Recent Labs: 06/09/2016: ALT 16; BUN 17; Creatinine, Ser 1.31; Hemoglobin 13.1; Platelets 185.0; Potassium 4.2; Sodium 141; TSH 2.03   Recent Lipid Panel Lab Results  Component Value Date/Time   CHOL 136 06/09/2016 09:52 AM   TRIG 49.0 06/09/2016 09:52 AM   HDL 42.70 06/09/2016 09:52 AM   CHOLHDL 3 06/09/2016 09:52 AM   LDLCALC 83 06/09/2016 09:52 AM   LDLDIRECT 184.4 02/10/2013 09:59 AM    Physical Exam:    VS:  BP (!) 160/76   Pulse (!) 59   Ht 5\' 11"  (1.803 m)   Wt 240 lb 6.4 oz (109 kg)   BMI  33.53 kg/m     Wt Readings from Last 3 Encounters:  06/13/16 240 lb 6.4 oz (109 kg)  06/09/16 236 lb (107 kg)  03/10/16 246 lb (111.6 kg)     Physical Exam  Constitutional: He is oriented to person, place, and time. He appears well-developed and well-nourished. No distress.  HENT:  Head: Normocephalic and atraumatic.  Eyes: No scleral icterus.  Neck: Normal range of motion. No JVD present. Carotid bruit is not present.  Cardiovascular: Normal rate, regular rhythm,  S1 normal, S2 normal and normal heart sounds.   No murmur heard. Pulmonary/Chest: Effort normal and breath sounds normal. He has no wheezes. He has no rhonchi. He has no rales.  Abdominal: Soft. There is no hepatomegaly. There is no tenderness.  Musculoskeletal: He exhibits no edema.  Neurological: He is alert and oriented to person, place, and time.  Skin: Skin is warm and dry.  Psychiatric: He has a normal mood and affect.    ASSESSMENT:    1. Coronary artery disease involving native coronary artery of native heart without angina pectoris   2. Bilateral carotid artery disease ([place])   3. Essential hypertension   4. HYPERCHOLESTEROLEMIA    PLAN:    In order of problems listed above:  1. Coronary artery disease involving native coronary artery of native heart without angina pectoris - S/p CABG in 2016.  He is doing well without angina.  Continue ASA, statin, beta-blocker.    2. Bilateral carotid artery disease ([place]) - He had mild plaque prior to his coronary artery bypass grafting.  Obtain FU ultrasound.    3. Essential hypertension - BP above target.  Increase Lisinopril to 20 mg QD.  Check BMET 2 weeks.  We discussed limiting his salt, losing weight and increasing activity.  I can see him back in 6 mos.  He will send some BP readings from home in 2-3 weeks.   4. HYPERCHOLESTEROLEMIA - Recent LDL 82.  Would like to see LDL < 70.  But, his LDL reduction is close to 50% since 2015. Will continue current Rx for  now.   Dispo:  Return in about 6 months (around 12/13/2016) for Routine Follow Up, w/ Richardson Dopp, PA-C.   Medication Adjustments/Labs and Tests Ordered: Current medicines are reviewed at length with the patient today.  Concerns regarding medicines are outlined above.  Orders/Tests:  Orders Placed This Encounter  Procedures  . Basic Metabolic Panel (BMET)  . EKG 12-Lead   Medication changes: Meds ordered this encounter  Medications  . lisinopril (PRINIVIL,ZESTRIL) 20 MG tablet    Sig: Take 1 tablet (20 mg total) by mouth daily.    Dispense:  90 tablet    Refill:  3   Signed, Richardson Dopp, PA-C  06/13/2016 10:00 AM    [facility] [address], [place], [place]  [address] Phone: [phone]; Fax: [phone]

## 2016-06-14 ENCOUNTER — Ambulatory Visit (INDEPENDENT_AMBULATORY_CARE_PROVIDER_SITE_OTHER)
Admission: RE | Admit: 2016-06-14 | Discharge: 2016-06-14 | Disposition: A | Discharge status: Home / Self Care | Payer: Medicare HMO | Source: Ambulatory Visit | Attending: Acute Care | Admitting: Acute Care

## 2016-06-14 DIAGNOSIS — Z87891 Personal history of nicotine dependence: Secondary | ICD-10-CM | POA: Diagnosis not present

## 2016-06-15 ENCOUNTER — Other Ambulatory Visit: Payer: Self-pay | Admitting: Acute Care

## 2016-06-15 DIAGNOSIS — Z87891 Personal history of nicotine dependence: Secondary | ICD-10-CM

## 2016-06-25 ENCOUNTER — Encounter: Payer: Self-pay | Admitting: Physician Assistant

## 2016-06-29 ENCOUNTER — Ambulatory Visit (HOSPITAL_COMMUNITY)
Admission: RE | Admit: 2016-06-29 | Discharge: 2016-06-29 | Disposition: A | Discharge status: Home / Self Care | Payer: Medicare HMO | Source: Ambulatory Visit | Attending: Cardiology | Admitting: Cardiology

## 2016-06-29 ENCOUNTER — Encounter: Payer: Self-pay | Admitting: Physician Assistant

## 2016-06-29 DIAGNOSIS — E785 Hyperlipidemia, unspecified: Secondary | ICD-10-CM | POA: Insufficient documentation

## 2016-06-29 DIAGNOSIS — Z87891 Personal history of nicotine dependence: Secondary | ICD-10-CM | POA: Insufficient documentation

## 2016-06-29 DIAGNOSIS — Z951 Presence of aortocoronary bypass graft: Secondary | ICD-10-CM | POA: Insufficient documentation

## 2016-06-29 DIAGNOSIS — I2510 Atherosclerotic heart disease of native coronary artery without angina pectoris: Secondary | ICD-10-CM | POA: Insufficient documentation

## 2016-06-29 DIAGNOSIS — E119 Type 2 diabetes mellitus without complications: Secondary | ICD-10-CM | POA: Insufficient documentation

## 2016-06-29 DIAGNOSIS — I6523 Occlusion and stenosis of bilateral carotid arteries: Secondary | ICD-10-CM | POA: Diagnosis not present

## 2016-06-29 DIAGNOSIS — I779 Disorder of arteries and arterioles, unspecified: Secondary | ICD-10-CM | POA: Diagnosis present

## 2016-06-29 DIAGNOSIS — I10 Essential (primary) hypertension: Secondary | ICD-10-CM | POA: Diagnosis not present

## 2016-06-29 DIAGNOSIS — I739 Peripheral vascular disease, unspecified: Secondary | ICD-10-CM

## 2016-07-06 ENCOUNTER — Other Ambulatory Visit: Payer: Self-pay | Admitting: *Deleted

## 2016-07-06 MED ORDER — AMLODIPINE BESYLATE 2.5 MG PO TABS: 2.5000 mg | ORAL_TABLET | Freq: Every day | ORAL | 3 refills | Status: DC

## 2016-07-17 ENCOUNTER — Telehealth: Payer: Self-pay | Admitting: Physician Assistant

## 2016-07-17 MED ORDER — AMLODIPINE BESYLATE 5 MG PO TABS: 5.0000 mg | ORAL_TABLET | Freq: Every day | ORAL | 3 refills | Status: DC

## 2016-07-17 NOTE — Telephone Encounter (Signed)
New message    Pt is calling asking for a call back.   Pt c/o BP issue: STAT if pt c/o blurred vision, one-sided weakness or slurred speech  1. What are your last 5 BP readings? 7/4-169/87 7/5-165/94 7/6-170/91  2. Are you having any other symptoms (ex. Dizziness, headache, blurred vision, passed out)? No   3. What is your BP issue? Pt states that his bp is still going up and he has been taking his medication

## 2016-07-17 NOTE — Telephone Encounter (Signed)
I s/w pt today in regards to BP readings. I d/w pt that Richardson Dopp, PA is out of the office this week though I did d/w BP readings with Dr. Angelena Form. I advised pt of recommendation per Dr. Angelena Form to increase Amlodipine 5 mg daily; med list update. Pt advised to continue to monitor BP so that we can see if the increased dose of Amlodipine is helping lower BP. I did advise pt since he just picked up the Amlodipine 2.5 mg tablet he can go ahead and double these up and take 2 of the 2.5 mg tablet to = 5 mg daily. Advised when his is down to his last week of the Amlodipine 2.5 mg tablet to call his pharmacy and we will send in the Amlodipine 5 mg daily.  Pt agreeable to plan of care.

## 2016-08-12 ENCOUNTER — Other Ambulatory Visit: Payer: Self-pay | Admitting: Endocrinology

## 2016-08-14 DIAGNOSIS — R972 Elevated prostate specific antigen [PSA]: Secondary | ICD-10-CM | POA: Diagnosis not present

## 2016-08-14 DIAGNOSIS — N5201 Erectile dysfunction due to arterial insufficiency: Secondary | ICD-10-CM | POA: Diagnosis not present

## 2016-08-23 DIAGNOSIS — R300 Dysuria: Secondary | ICD-10-CM | POA: Diagnosis not present

## 2016-09-25 ENCOUNTER — Other Ambulatory Visit: Payer: Self-pay | Admitting: Endocrinology

## 2017-01-26 DIAGNOSIS — N5201 Erectile dysfunction due to arterial insufficiency: Secondary | ICD-10-CM | POA: Diagnosis not present

## 2017-02-27 ENCOUNTER — Ambulatory Visit: Payer: Medicare HMO | Admitting: Physician Assistant

## 2017-02-27 ENCOUNTER — Encounter: Payer: Self-pay | Admitting: Physician Assistant

## 2017-02-27 VITALS — BP 162/80 | HR 68 | Ht 71.0 in | Wt 244.4 lb

## 2017-02-27 DIAGNOSIS — I10 Essential (primary) hypertension: Secondary | ICD-10-CM

## 2017-02-27 DIAGNOSIS — E1159 Type 2 diabetes mellitus with other circulatory complications: Secondary | ICD-10-CM | POA: Diagnosis not present

## 2017-02-27 DIAGNOSIS — E7800 Pure hypercholesterolemia, unspecified: Secondary | ICD-10-CM

## 2017-02-27 DIAGNOSIS — I2510 Atherosclerotic heart disease of native coronary artery without angina pectoris: Secondary | ICD-10-CM | POA: Diagnosis not present

## 2017-02-27 MED ORDER — CARVEDILOL 12.5 MG PO TABS: 12.5000 mg | ORAL_TABLET | Freq: Two times a day (BID) | ORAL | 3 refills | Status: DC

## 2017-02-27 NOTE — Progress Notes (Signed)
Cardiology Office Note:    Date:  02/27/2017   ID:  Shane [name], DOB [birth year], MRN [id number]  PCP:  Renato Shin, MD  Cardiologist:  [name], MD / Richardson Dopp, PA-C   Referring MD: Renato Shin, MD   Chief Complaint  Patient presents with  . Follow-up    CAD    History of Present Illness:    Shane [name] is a 67 y.o. male with coronary artery disease status post coronary artery bypass grafting in 2016, diabetes, hypertension, hyperlipidemia.  He was last seen in June 2018.    [name] returns for follow-up.  Since last seen, he has been doing well.  He has quit smoking.  He denies any chest discomfort or shortness of breath.  He denies syncope, paroxysmal nocturnal dyspnea.  He does note lower extremity edema after prolonged standing.  This resolves with lying supine.  He has some numbness in his toes.  He denies claudication symptoms.  Prior CV studies:   The following studies were reviewed today:  Carotid US 06/29/16 Stable bilateral ICA 1-39  Cardiac cath 03/18/14 LM: No significant stenosis. LAD: At origin of D1 diffuse 80-90% stenosis with a plaque ulceration. D1 90% stenosis.  LCx: Proximal 75% then long 50% RCA: Mid occluded EF: 55%  Echocardiogram 03/20/14 EF 50% to 55%. Grade 1 diastolic dysfunction.  Carotid US 03/20/14 Bilateral - 1% to 39% ICA stenosis.  Myoview 03/11/14 Intermediate risk stress nuclear study with a moderate-sized and intensity, reversible inferior and inferoapical perfusion defect suggestive of ischemia.LV Ejection Fraction: 48%.LV Wall Motion:Inferior hypokinesis  Past Medical History:  Diagnosis Date  . Asthma    PMH:as a child  . Carotid stenosis    a. Carotid US 3/16:  bilat ICA 1-39% // b. Carotid US 6/18: Stable bilateral 1-39 ICA  . CIGARETTE SMOKER 11/30/2009   Qualifier: Diagnosis of  By: Loanne Drilling MD, Jacelyn Pi   . Colon polyps 06/17/2011  . Coronary artery disease    a. Myoview 3/16:  Inf ischemia, EF 48%,  int risk;  b.  LHC 3/16:  3 v CAD,  c.  s/p CABG x 4 03/2014  . DIABETES MELLITUS, TYPE II 09/25/2006  . Diverticulosis 06/17/2011  . GERD (gastroesophageal reflux disease)   . Hx of cardiovascular stress test    Lexiscan Myoview 3/16:  Inferior ischemia, EF 48%, Intermediate Risk  . Hx of echocardiogram    a.  Echo 3/16:  EF 50-55%, Gr 1 DD  . HYPERCHOLESTEROLEMIA   . HYPERTENSION   . INSOMNIA 09/10/2008  . PSA, INCREASED 11/30/2009  . RENAL INSUFFICIENCY 09/25/2006  . Tinnitus   . Wears glasses     Past Surgical History:  Procedure Laterality Date  . CATARACT EXTRACTION W/ INTRAOCULAR LENS  IMPLANT, BILATERAL    . COLONOSCOPY    . CORONARY ARTERY BYPASS GRAFT N/A 03/23/2014   Procedure: CORONARY ARTERY BYPASS GRAFTING (CABG), ON PUMP, TIMES FOUR, USING LEFT INTERNAL MAMMARY ARTERY, BILATERAL GREATER SAPHENOUS VEINS HARVESTED ENDOSCOPICALLY;  Surgeon: Ivin Poot, MD;  Location: [facility];  Service: Open Heart Surgery;  Laterality: N/A;  . HERNIA REPAIR    . LEFT HEART CATHETERIZATION WITH CORONARY ANGIOGRAM N/A 03/18/2014   Procedure: LEFT HEART CATHETERIZATION WITH CORONARY ANGIOGRAM;  Surgeon: [name], MD;  Location: [facility] CATH LAB;  Service: Cardiovascular;  Laterality: N/A;  . POLYPECTOMY    . TEE WITHOUT CARDIOVERSION N/A 03/23/2014   Procedure: TRANSESOPHAGEAL ECHOCARDIOGRAM (TEE);  Surgeon: Ivin Poot, MD;  Location:  [place] OR;  Service: Open Heart Surgery;  Laterality: N/A;  . TONSILLECTOMY      Current Medications: Current Meds  Medication Sig  . aspirin 81 MG tablet Take 1 tablet (81 mg total) by mouth daily.  [name] Kitchen atorvastatin (LIPITOR) 80 MG tablet Take 1 tablet (80 mg total) by mouth daily.  [name] Kitchen lisinopril (PRINIVIL,ZESTRIL) 20 MG tablet Take 1 tablet (20 mg total) by mouth daily.  [name] Kitchen omeprazole (PRILOSEC) 20 MG capsule TAKE ONE CAPSULE BY MOUTH ONCE DAILY  . [DISCONTINUED] metoprolol tartrate (LOPRESSOR) 50 MG tablet TAKE ONE TABLET BY MOUTH TWICE DAILY     Allergies:    Patient has no known allergies.   Social History   Tobacco Use  . Smoking status: Former Smoker    Packs/day: 2.00    Years: 50.00    Pack years: 100.00    Types: Cigarettes    Last attempt to quit: 03/17/2014    Years since quitting: 2.9  . Smokeless tobacco: Never Used  . Tobacco comment: none  Substance Use Topics  . Alcohol use: No    Alcohol/week: 0.0 oz    Comment: none per pt.  . Drug use: No     Family Hx: The patient's family history includes Arrhythmia in his sister; Cancer in his father; Heart attack in his maternal grandmother; Heart disease in his mother, sister, and sister; Hypertension in his father; Kidney failure in his sister. There is no history of Stroke or Colon cancer.  ROS:   Please see the history of present illness.    ROS All other systems reviewed and are negative.   EKGs/Labs/Other Test Reviewed:    EKG:  EKG is  ordered today.  The ekg ordered today demonstrates normal sinus rhythm, heart rate 68, leftward axis, nonspecific ST-T wave changes, QTC 442 ms, no changes  Recent Labs: 06/09/2016: ALT 16; BUN 17; Creatinine, Ser 1.31; Hemoglobin 13.1; Platelets 185.0; Potassium 4.2; Sodium 141; TSH 2.03   Recent Lipid Panel Lab Results  Component Value Date/Time   CHOL 136 06/09/2016 09:52 AM   TRIG 49.0 06/09/2016 09:52 AM   HDL 42.70 06/09/2016 09:52 AM   CHOLHDL 3 06/09/2016 09:52 AM   LDLCALC 83 06/09/2016 09:52 AM   LDLDIRECT 184.4 02/10/2013 09:59 AM    Physical Exam:    VS:  BP (!) 162/80   Pulse 68   Ht 5\' 11"  (1.803 m)   Wt 244 lb 6.4 oz (110.9 kg)   SpO2 96%   BMI 34.09 kg/m     Wt Readings from Last 3 Encounters:  02/27/17 244 lb 6.4 oz (110.9 kg)  06/13/16 240 lb 6.4 oz (109 kg)  06/09/16 236 lb (107 kg)     Physical Exam  Constitutional: He is oriented to person, place, and time. He appears well-developed and well-nourished. No distress.  HENT:  Head: Normocephalic and atraumatic.  Eyes: No scleral icterus.  Neck: No  JVD present.  Cardiovascular: Normal rate and regular rhythm.  No murmur heard. Pulses:      Dorsalis pedis pulses are 2+ on the right side, and 2+ on the left side.       Posterior tibial pulses are 2+ on the right side, and 2+ on the left side.  Pulmonary/Chest: Effort normal. He has no rales.  Abdominal: Soft.  Musculoskeletal: He exhibits no edema.  Neurological: He is alert and oriented to person, place, and time.  Skin: Skin is warm and dry.    ASSESSMENT & PLAN:  1.  Essential hypertension Blood pressure remains above target.  When he checks his blood pressure at home, he typically gets readings >130/80.  -Continue current dose of amlodipine, lisinopril  -DC metoprolol tartrate  -Start carvedilol 12.5 mg twice daily  -Follow-up with me 6 weeks  2.  Coronary artery disease Status post CABG in 2016.  He denies symptoms of angina.  Continue aspirin, statin, beta-blocker, ACE inhibitor.  3.  HYPERCHOLESTEROLEMIA LDL optimal on most recent lab work.  Continue current Rx.    4.  Type 2 diabetes mellitus He did ask about peripheral arterial disease.  He notes some numbness in his toes.  He has good pulses and had normal ABIs prior to his bypass.  He denies symptoms of claudication.  I suspect he may have some element of neuropathy related to his diabetes.  Continue follow-up with primary care.   Dispo:  Return in about 6 weeks (around 04/10/2017) for Routine Follow Up, w/ Richardson Dopp, PA-C.   Medication Adjustments/Labs and Tests Ordered: Current medicines are reviewed at length with the patient today.  Concerns regarding medicines are outlined above.  Tests Ordered: Orders Placed This Encounter  Procedures  . EKG 12-Lead   Medication Changes: Meds ordered this encounter  Medications  . carvedilol (COREG) 12.5 MG tablet    Sig: Take 1 tablet (12.5 mg total) by mouth 2 (two) times daily.    Dispense:  180 tablet    Refill:  3    Signed, Richardson Dopp, PA-C    02/27/2017 11:38 AM    [facility] [place], [place], [place]  [address] Phone: [phone]; Fax: [phone]

## 2017-02-27 NOTE — Patient Instructions (Addendum)
Medication Instructions:  1. STOP METOPROLOL  2. START COREG 12.5 MG 1 TABLET TWICE DAILY; NEW RX HAS BEEN SENT IN   Labwork: NONE ORDERED TODAY  Testing/Procedures: NONE ORDERED TODAY  Follow-Up: April 10, 2017 @ 8:15 WITH [place], [facility]   Any Other Special Instructions Will Be Listed Below (If Applicable).     If you need a refill on your cardiac medications before your next appointment, please call your pharmacy.

## 2017-04-09 ENCOUNTER — Telehealth: Payer: Self-pay | Admitting: *Deleted

## 2017-04-09 NOTE — Telephone Encounter (Signed)
NEED TO [place], PROVIDER NOT AVAILABLE AT 8:15 OR 8:45 TOMORROW..[facility]..,. I HAVE HELD 4/9 @ 8:45 IF PT WOULD LIKE THIS DATE AND TIME.

## 2017-04-10 ENCOUNTER — Ambulatory Visit: Payer: Medicare HMO | Admitting: Physician Assistant

## 2017-04-17 ENCOUNTER — Ambulatory Visit: Payer: Medicare HMO | Admitting: Physician Assistant

## 2017-05-01 ENCOUNTER — Other Ambulatory Visit: Payer: Self-pay | Admitting: Endocrinology

## 2017-05-11 ENCOUNTER — Ambulatory Visit: Payer: Medicare HMO | Admitting: Physician Assistant

## 2017-05-14 ENCOUNTER — Encounter (HOSPITAL_COMMUNITY): Payer: Self-pay | Admitting: Family Medicine

## 2017-05-14 ENCOUNTER — Ambulatory Visit (HOSPITAL_COMMUNITY)
Admission: EM | Admit: 2017-05-14 | Discharge: 2017-05-14 | Disposition: A | Discharge status: Home / Self Care | Payer: Medicare HMO | Attending: Family Medicine | Admitting: Family Medicine

## 2017-05-14 DIAGNOSIS — L258 Unspecified contact dermatitis due to other agents: Secondary | ICD-10-CM

## 2017-05-14 MED ORDER — PREDNISONE 10 MG PO TABS: ORAL_TABLET | ORAL | 0 refills | Status: AC

## 2017-05-14 MED ORDER — TRIAMCINOLONE ACETONIDE 0.1 % EX CREA: 1.0000 "application " | TOPICAL_CREAM | Freq: Two times a day (BID) | CUTANEOUS | 0 refills | Status: DC

## 2017-05-14 NOTE — ED Provider Notes (Signed)
Shane [name]    CSN: [id number] Arrival date & time: 05/14/17  1006     History   Chief Complaint Chief Complaint  Patient presents with  . Poison Ivy    HPI Shane [name] is a 67 y.o. male.   Shane [name] presents with complaints of itching rash after doing yard work and clearing shrubs approximately 2 weeks ago. States has had poison ivy in the past and feels this is similar. Has not improved but has not worsened. Rash is to bilateral arms, abdomen and right leg. Itching. No pain. Has had some drainage from vesicles. Has been applying calamine which has not helped. States he has since washed all exposed clothing etc. Hx asthma, gerd, htn, CAD and CABG. Is not treated for DM.     ROS per HPI.      Past Medical History:  Diagnosis Date  . Asthma    PMH:as a child  . Carotid stenosis    a. Carotid US 3/16:  bilat ICA 1-39% // b. Carotid US 6/18: Stable bilateral 1-39 ICA  . CIGARETTE SMOKER 11/30/2009   Qualifier: Diagnosis of  By: Loanne Drilling MD, Jacelyn Pi   . Colon polyps 06/17/2011  . Coronary artery disease    a. Myoview 3/16:  Inf ischemia, EF 48%, int risk;  b.  LHC 3/16:  3 v CAD,  c.  s/p CABG x 4 03/2014  . DIABETES MELLITUS, TYPE II 09/25/2006  . Diverticulosis 06/17/2011  . GERD (gastroesophageal reflux disease)   . Hx of cardiovascular stress test    Lexiscan Myoview 3/16:  Inferior ischemia, EF 48%, Intermediate Risk  . Hx of echocardiogram    a.  Echo 3/16:  EF 50-55%, Gr 1 DD  . HYPERCHOLESTEROLEMIA   . HYPERTENSION   . INSOMNIA 09/10/2008  . PSA, INCREASED 11/30/2009  . RENAL INSUFFICIENCY 09/25/2006  . Tinnitus   . Wears glasses     Patient Active Problem List   Diagnosis Date Noted  . CAD (coronary artery disease) 06/13/2016  . Carotid artery disease ([place]) 06/13/2016  . Cervical pain 04/27/2015  . Bilateral tinnitus 04/27/2015  . Diabetes ([place]) 03/13/2015  . Numbness 02/05/2015  . S/P CABG x 4 03/23/2014  . Wellness examination 02/10/2014  . Rectal  bleeding 02/10/2014  . Hypogonadism male 10/20/2013  . Impotence of organic origin 08/18/2013  . Vitamin B12 deficiency 02/11/2013  . Closed fracture of unspecified part of upper end of humerus 02/10/2013  . Encounter for long-term (current) use of other medications 07/06/2011  . Colon polyps 06/17/2011  . Diverticulosis 06/17/2011  . Contact dermatitis 06/16/2011  . CIGARETTE SMOKER 11/30/2009  . PSA, INCREASED 11/30/2009  . INSOMNIA 09/10/2008  . HYPERCHOLESTEROLEMIA 01/22/2007  . Hyperpotassemia 09/25/2006  . Disorder resulting from impaired renal function 09/25/2006  . Essential hypertension 08/24/2006    Past Surgical History:  Procedure Laterality Date  . CATARACT EXTRACTION W/ INTRAOCULAR LENS  IMPLANT, BILATERAL    . COLONOSCOPY    . CORONARY ARTERY BYPASS GRAFT N/A 03/23/2014   Procedure: CORONARY ARTERY BYPASS GRAFTING (CABG), ON PUMP, TIMES FOUR, USING LEFT INTERNAL MAMMARY ARTERY, BILATERAL GREATER SAPHENOUS VEINS HARVESTED ENDOSCOPICALLY;  Surgeon: Ivin Poot, MD;  Location: [facility];  Service: Open Heart Surgery;  Laterality: N/A;  . HERNIA REPAIR    . LEFT HEART CATHETERIZATION WITH CORONARY ANGIOGRAM N/A 03/18/2014   Procedure: LEFT HEART CATHETERIZATION WITH CORONARY ANGIOGRAM;  Surgeon: [name], MD;  Location[facility] CATH LAB;  Service: Cardiovascular;  Laterality: N/A;  .  POLYPECTOMY    . TEE WITHOUT CARDIOVERSION N/A 03/23/2014   Procedure: TRANSESOPHAGEAL ECHOCARDIOGRAM (TEE);  Surgeon: Ivin Poot, MD;  Location: [facility];  Service: Open Heart Surgery;  Laterality: N/A;  . TONSILLECTOMY         Home Medications    Prior to Admission medications   Medication Sig Start Date End Date Taking? Authorizing Provider  amLODipine (NORVASC) 5 MG tablet Take 1 tablet (5 mg total) by mouth daily. 07/17/16 10/15/16  Richardson Dopp T, PA-C  aspirin 81 MG tablet Take 1 tablet (81 mg total) by mouth daily. 04/20/14   Richardson Dopp T, PA-C  atorvastatin (LIPITOR) 80 MG  tablet TAKE 1 TABLET BY MOUTH ONCE DAILY 05/02/17   Renato Shin, MD  carvedilol (COREG) 12.5 MG tablet Take 1 tablet (12.5 mg total) by mouth 2 (two) times daily. 02/27/17 05/28/17  Richardson Dopp T, PA-C  lisinopril (PRINIVIL,ZESTRIL) 20 MG tablet Take 1 tablet (20 mg total) by mouth daily. 06/13/16 02/27/17  Richardson Dopp T, PA-C  omeprazole (PRILOSEC) 20 MG capsule TAKE ONE CAPSULE BY MOUTH ONCE DAILY 09/25/16   Renato Shin, MD  predniSONE (DELTASONE) 10 MG tablet Take 6 tablets (60 mg total) by mouth daily for 2 days, THEN 4 tablets (40 mg total) daily for 2 days, THEN 2 tablets (20 mg total) daily for 2 days. 05/14/17 05/20/17  Augusto Gamble B, NP  triamcinolone cream (KENALOG) 0.1 % Apply 1 application topically 2 (two) times daily. 05/14/17   Zigmund Gottron, NP    Family History Family History  Problem Relation Age of Onset  . Cancer Father   . Hypertension Father   . Heart disease Mother   . Heart disease Sister   . Kidney failure Sister   . Heart disease Sister   . Arrhythmia Sister   . Heart attack Maternal Grandmother   . Stroke Neg Hx   . Colon cancer Neg Hx     Social History Social History   Tobacco Use  . Smoking status: Former Smoker    Packs/day: 2.00    Years: 50.00    Pack years: 100.00    Types: Cigarettes    Last attempt to quit: 03/17/2014    Years since quitting: 3.1  . Smokeless tobacco: Never Used  . Tobacco comment: none  Substance Use Topics  . Alcohol use: No    Alcohol/week: 0.0 oz    Comment: none per pt.  . Drug use: No     Allergies   Patient has no known allergies.   Review of Systems Review of Systems   Physical Exam Triage Vital Signs ED Triage Vitals  Enc Vitals Group     BP 05/14/17 1046 (!) 151/93     Pulse Rate 05/14/17 1046 65     Resp 05/14/17 1046 18     Temp 05/14/17 1046 98.4 F (36.9 C)     Temp src --      SpO2 05/14/17 1046 98 %     Weight --      Height --      Head Circumference --      Peak Flow --      Pain  Score 05/14/17 1045 0     Pain Loc --      Pain Edu? --      Excl. in [place]? --    No data found.  Updated Vital Signs BP (!) 151/93   Pulse 65   Temp 98.4 F (36.9 C)  Resp 18   SpO2 98%   Physical Exam  Constitutional: He is oriented to person, place, and time. He appears well-developed and well-nourished.  Cardiovascular: Normal rate and regular rhythm.  Pulmonary/Chest: Effort normal and breath sounds normal.  Neurological: He is alert and oriented to person, place, and time.  Skin: Skin is warm and dry.  Skin toned/ slightly pink raised lesions, papular rash scattered to arms, abdomen and right leg; without vesicles at this time visualized; appears to have already opened; see photos of arms          UC Treatments / Results  Labs (all labs ordered are listed, but only abnormal results are displayed) Labs Reviewed - No data to display  EKG None  Radiology No results found.  Procedures Procedures (including critical care time)  Medications Ordered in UC Medications - No data to display  Initial Impression / Assessment and Plan / UC Course  I have reviewed the triage vital signs and the nursing notes.  Pertinent labs & imaging results that were available during my care of the patient were reviewed by me and considered in my medical decision making (see chart for details).     Taper of prednisone as well as triamcinolone to areas of increased symptoms. Continue to follow with PCP if no improvement. Patient verbalized understanding and agreeable to plan.     Final Clinical Impressions(s) / UC Diagnoses   Final diagnoses:  Contact dermatitis due to other agent, unspecified contact dermatitis type     Discharge Instructions     May continue with use of calamine lotion to help with itching. May use prescribed cream twice a day to larger lesions to help with lesions as well. Complete 6 days of prednisone. If symptoms worsen or do not improve in the next  week to return to be seen or to follow up with your PCP.     ED Prescriptions    Medication Sig Dispense Auth. Provider   predniSONE (DELTASONE) 10 MG tablet Take 6 tablets (60 mg total) by mouth daily for 2 days, THEN 4 tablets (40 mg total) daily for 2 days, THEN 2 tablets (20 mg total) daily for 2 days. 24 tablet Augusto Gamble B, NP   triamcinolone cream (KENALOG) 0.1 % Apply 1 application topically 2 (two) times daily. 30 g Zigmund Gottron, NP     Controlled Substance Prescriptions [place] Controlled Substance Registry consulted? Not Applicable   Zigmund Gottron, NP 05/14/17 [phone]

## 2017-05-14 NOTE — ED Triage Notes (Signed)
Pt here for poison ivy x 2 weeks. He has been treating with OTC meds and not better.

## 2017-05-14 NOTE — Discharge Instructions (Signed)
May continue with use of calamine lotion to help with itching. May use prescribed cream twice a day to larger lesions to help with lesions as well. Complete 6 days of prednisone. If symptoms worsen or do not improve in the next week to return to be seen or to follow up with your PCP.

## 2017-05-28 ENCOUNTER — Ambulatory Visit: Payer: Medicare HMO | Admitting: Cardiology

## 2017-05-28 NOTE — Progress Notes (Deleted)
Cardiology Office Note:    Date:  05/28/2017   ID:  Shane [name], DOB [birth year], MRN [id number]  PCP:  Renato Shin, MD  Cardiologist:  [name], MD  Referring MD: Renato Shin, MD   No chief complaint on file. ***  History of Present Illness:    Shane [name] is a 67 y.o. male with a past medical history significant for CAD s/p CABG 2016, diabetes, hypertension and hyperlipidemia. He was last seen in 02/2017 by Richardson Dopp, PA at which time he was noted to have elevated blood pressure. He was continued on amlodipine and lisinopril. His metoprolol was switched to carvedilol 12.5 mg bid with plan for follow up in 6 weeks.     ------------------------------------  Hypertension: Coronary artery disease: s/p CABG 2016.  Continue aspirin, statin, BB, ACE-I  Hyperlipidemia: LDL 83 un 06/2016 on atorvastatin 80 mg daily.  Diabetes type 2: diet controlled. A1c 5.7 in 06/2016. Acceptable control.   Past Medical History:  Diagnosis Date  . Asthma    PMH:as a child  . Carotid stenosis    a. Carotid US 3/16:  bilat ICA 1-39% // b. Carotid US 6/18: Stable bilateral 1-39 ICA  . CIGARETTE SMOKER 11/30/2009   Qualifier: Diagnosis of  By: Loanne Drilling MD, Jacelyn Pi   . Colon polyps 06/17/2011  . Coronary artery disease    a. Myoview 3/16:  Inf ischemia, EF 48%, int risk;  b.  LHC 3/16:  3 v CAD,  c.  s/p CABG x 4 03/2014  . DIABETES MELLITUS, TYPE II 09/25/2006  . Diverticulosis 06/17/2011  . GERD (gastroesophageal reflux disease)   . Hx of cardiovascular stress test    Lexiscan Myoview 3/16:  Inferior ischemia, EF 48%, Intermediate Risk  . Hx of echocardiogram    a.  Echo 3/16:  EF 50-55%, Gr 1 DD  . HYPERCHOLESTEROLEMIA   . HYPERTENSION   . INSOMNIA 09/10/2008  . PSA, INCREASED 11/30/2009  . RENAL INSUFFICIENCY 09/25/2006  . Tinnitus   . Wears glasses     Past Surgical History:  Procedure Laterality Date  . CATARACT EXTRACTION W/ INTRAOCULAR LENS  IMPLANT, BILATERAL    .  COLONOSCOPY    . CORONARY ARTERY BYPASS GRAFT N/A 03/23/2014   Procedure: CORONARY ARTERY BYPASS GRAFTING (CABG), ON PUMP, TIMES FOUR, USING LEFT INTERNAL MAMMARY ARTERY, BILATERAL GREATER SAPHENOUS VEINS HARVESTED ENDOSCOPICALLY;  Surgeon: Ivin Poot, MD;  Location: [place];  Service: Open Heart Surgery;  Laterality: N/A;  . HERNIA REPAIR    . LEFT HEART CATHETERIZATION WITH CORONARY ANGIOGRAM N/A 03/18/2014   Procedure: LEFT HEART CATHETERIZATION WITH CORONARY ANGIOGRAM;  Surgeon: [name], MD;  Location: [facility] CATH LAB;  Service: Cardiovascular;  Laterality: N/A;  . POLYPECTOMY    . TEE WITHOUT CARDIOVERSION N/A 03/23/2014   Procedure: TRANSESOPHAGEAL ECHOCARDIOGRAM (TEE);  Surgeon: Ivin Poot, MD;  Location: [facility];  Service: Open Heart Surgery;  Laterality: N/A;  . TONSILLECTOMY      Current Medications: No outpatient medications have been marked as taking for the 05/28/17 encounter (Appointment) with Daune Perch, NP.     Allergies:   Patient has no known allergies.   Social History   Socioeconomic History  . Marital status: Married    Spouse name: Not on file  . Number of children: Not on file  . Years of education: Not on file  . Highest education level: Not on file  Occupational History  . 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 Financial  resource strain: Not on file  . Food insecurity:    Worry: Not on file    Inability: Not on file  . Transportation needs:    Medical: Not on file    Non-medical: Not on file  Tobacco Use  . Smoking status: Former Smoker    Packs/day: 2.00    Years: 50.00    Pack years: 100.00    Types: Cigarettes    Last attempt to quit: 03/17/2014    Years since quitting: 3.2  . Smokeless tobacco: Never Used  . Tobacco comment: none  Substance and Sexual Activity  . Alcohol use: No    Alcohol/week: 0.0 oz    Comment: none per pt.  . Drug use: No  . Sexual activity: Not on file  Lifestyle  . Physical activity:    Days per week:  Not on file    Minutes per session: Not on file  . Stress: Not on file  Relationships  . Social connections:    Talks on phone: Not on file    Gets together: Not on file    Attends religious service: Not on file    Active member of club or organization: Not on file    Attends meetings of clubs or organizations: Not on file    Relationship status: Not on file  Other Topics Concern  . 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 There is no history of Stroke or Colon cancer. ROS:   Please see the history of present illness.    *** All other systems reviewed and are negative.  EKGs/Labs/Other Studies Reviewed:    The following studies were reviewed today:  Carotid US 06/29/16 Stable bilateral ICA 1-39  Cardiac cath 03/18/14 LM: No significant stenosis. LAD: At origin of D1 diffuse 80-90% stenosis with a plaque ulceration. D1 90% stenosis.  LCx: Proximal 75% then long 50% RCA: Mid occluded EF: 55%  Echocardiogram 03/20/14 EF 50% to 55%. Grade 1 diastolic dysfunction.  Carotid US 03/20/14 Bilateral - 1% to 39% ICA stenosis.  Myoview 03/11/14 Intermediate risk stress nuclear study with a moderate-sized and intensity, reversible inferior and inferoapical perfusion defect suggestive of ischemia.LV Ejection Fraction: 48%.LV Wall Motion:Inferior hypokinesis    EKG:  EKG is *** ordered today.  The ekg ordered today demonstrates ***  Recent Labs: 06/09/2016: ALT 16; BUN 17; Creatinine, Ser 1.31; Hemoglobin 13.1; Platelets 185.0; Potassium 4.2; Sodium 141; TSH 2.03   Recent Lipid Panel    Component Value Date/Time   CHOL 136 06/09/2016 0952   TRIG 49.0 06/09/2016 0952   HDL  42.70 06/09/2016 0952   CHOLHDL 3 06/09/2016 0952   VLDL 9.8 06/09/2016 0952   LDLCALC 83 06/09/2016 0952   LDLDIRECT 184.4 02/10/2013 0959    Physical Exam:    VS:  There were no vitals taken for this visit.    Wt Readings from Last 3 Encounters:  02/27/17 244 lb 6.4 oz (110.9 kg)  06/13/16 240 lb 6.4 oz (109 kg)  06/09/16 236 lb (107 kg)     Physical Exam***   ASSESSMENT:    No diagnosis found. PLAN:    In order of problems listed above:  1. ***   Medication Adjustments/Labs  and Tests Ordered: Current medicines are reviewed at length with the patient today.  Concerns regarding medicines are outlined above. Labs and tests ordered and medication changes are outlined in the patient instructions below:  There are no Patient Instructions on file for this visit.   Signed, Daune Perch, NP  05/28/2017 5:19 AM    [place]

## 2017-05-30 ENCOUNTER — Ambulatory Visit: Payer: Medicare HMO | Admitting: Physician Assistant

## 2017-05-30 ENCOUNTER — Encounter: Payer: Self-pay | Admitting: Physician Assistant

## 2017-05-30 VITALS — BP 158/82 | HR 68 | Ht 71.0 in | Wt 249.0 lb

## 2017-05-30 DIAGNOSIS — E785 Hyperlipidemia, unspecified: Secondary | ICD-10-CM | POA: Diagnosis not present

## 2017-05-30 DIAGNOSIS — I2510 Atherosclerotic heart disease of native coronary artery without angina pectoris: Secondary | ICD-10-CM

## 2017-05-30 DIAGNOSIS — I10 Essential (primary) hypertension: Secondary | ICD-10-CM

## 2017-05-30 MED ORDER — OMEPRAZOLE 20 MG PO CPDR: 20.0000 mg | DELAYED_RELEASE_CAPSULE | Freq: Every day | ORAL | 3 refills | Status: DC

## 2017-05-30 MED ORDER — AMLODIPINE BESYLATE 10 MG PO TABS: 10.0000 mg | ORAL_TABLET | Freq: Every day | ORAL | 3 refills | Status: DC

## 2017-05-30 NOTE — Patient Instructions (Signed)
Medication Instructions:  1. INCREASE NORVASC TO 10 MG DAILY  2. CONTINUE ON THE METOPROLOL TARTRATE 50 MG TWICE DAILY   3. STOP THE COREG  4. A REFILL FOR OMEPRAZOLE WAS SENT IN   Labwork: LIPID AND CMET TO BE DONE IN 2 WEEKS  Testing/Procedures: NONE ORDERED TODAY  Follow-Up: Your physician wants you to follow-up in: 6 MONTHS WITH DR. Emelda [name] will receive a reminder letter in the mail two months in advance. If you don't receive a letter, please call our office to schedule the follow-up appointment.    Any Other Special Instructions Will Be Listed Below (If Applicable).  MONITOR BLOOD PRESSURE AND CALL OR SEND READINGS THROUGH MY CHART IN 2 WEEKS   If you need a refill on your cardiac medications before your next appointment, please call your pharmacy.

## 2017-05-30 NOTE — Progress Notes (Signed)
Cardiology Office Note:    Date:  05/30/2017   ID:  Shane [name], DOB 1950/02/08, MRN [id number]  PCP:  Renato Shin, MD  Cardiologist:  [name], MD   Referring MD: Renato Shin, MD   Chief Complaint  Patient presents with  . Follow-up    Hypertension    History of Present Illness:    Shane [name] is a 67 y.o. male with coronary artery disease status post coronary artery bypass grafting in 2016, diabetes, hypertension, hyperlipidemia.  He was last seen February 2019.  Blood pressure was above target and his metoprolol was switched to carvedilol.  Shane [name] returns for follow-up.  He is here alone.  He had a funny feeling in his chest while he was taking Coreg.  He decided to stop this and go back to metoprolol tartrate 50 mg twice daily.  He has felt well since then.  He denies chest discomfort.  He does note shortness of breath with exertion.  This is overall stable.  He denies PND.  He does note some leg swelling after prolonged standing.  This resolves after lying flat.  He denies syncope.  Prior CV studies:   The following studies were reviewed today:  Carotid US 06/29/16 Stable bilateral ICA 1-39  Cardiac cath 03/18/14 LM: No significant stenosis. LAD: At origin of D1 diffuse 80-90% stenosis with a plaque ulceration. D1 90% stenosis.  LCx: Proximal 75% then long 50% RCA: Mid occluded EF: 55%  Echocardiogram 03/20/14 EF 50% to 55%. Grade 1 diastolic dysfunction.  Carotid US 03/20/14 Bilateral - 1% to 39% ICA stenosis.  Myoview 03/11/14 Intermediate risk stress nuclear study with a moderate-sized and intensity, reversible inferior and inferoapical perfusion defect suggestive of ischemia.LV Ejection Fraction: 48%.LV Wall Motion:Inferior hypokinesis   Past Medical History:  Diagnosis Date  . Asthma    PMH:as a child  . Carotid stenosis    a. Carotid US 3/16:  bilat ICA 1-39% // b. Carotid US 6/18: Stable bilateral 1-39 ICA  . CIGARETTE SMOKER  11/30/2009   Qualifier: Diagnosis of  By: Loanne Drilling MD, Jacelyn Pi   . Colon polyps 06/17/2011  . Coronary artery disease    a. Myoview 3/16:  Inf ischemia, EF 48%, int risk;  b.  LHC 3/16:  3 v CAD,  c.  s/p CABG x 4 03/2014  . DIABETES MELLITUS, TYPE II 09/25/2006  . Diverticulosis 06/17/2011  . GERD (gastroesophageal reflux disease)   . Hx of cardiovascular stress test    Lexiscan Myoview 3/16:  Inferior ischemia, EF 48%, Intermediate Risk  . Hx of echocardiogram    a.  Echo 3/16:  EF 50-55%, Gr 1 DD  . HYPERCHOLESTEROLEMIA   . HYPERTENSION   . INSOMNIA 09/10/2008  . PSA, INCREASED 11/30/2009  . RENAL INSUFFICIENCY 09/25/2006  . Tinnitus   . Wears glasses    Surgical Hx: The patient  has a past surgical history that includes Hernia repair; Tonsillectomy; Colonoscopy; Polypectomy; left heart catheterization with coronary angiogram (N/A, 03/18/2014); Cataract extraction w/ intraocular lens  implant, bilateral; TEE without cardioversion (N/A, 03/23/2014); and Coronary artery bypass graft (N/A, 03/23/2014).   Current Medications: Current Meds  Medication Sig  . aspirin 81 MG tablet Take 1 tablet (81 mg total) by mouth daily.  [name] Kitchen atorvastatin (LIPITOR) 80 MG tablet TAKE 1 TABLET BY MOUTH ONCE DAILY  . lisinopril (PRINIVIL,ZESTRIL) 20 MG tablet Take 1 tablet (20 mg total) by mouth daily.  . metoprolol tartrate (LOPRESSOR) 50 MG tablet Take 50  mg by mouth 2 (two) times daily.  [name] Kitchen triamcinolone cream (KENALOG) 0.1 % Apply 1 application topically 2 (two) times daily.  . [DISCONTINUED] carvedilol (COREG) 12.5 MG tablet Take 1 tablet (12.5 mg total) by mouth 2 (two) times daily.     Allergies:   Coreg [carvedilol]   Social History   Tobacco Use  . Smoking status: Former Smoker    Packs/day: 2.00    Years: 50.00    Pack years: 100.00    Types: Cigarettes    Last attempt to quit: 03/17/2014    Years since quitting: 3.2  . Smokeless tobacco: Never Used  . Tobacco comment: none  Substance Use Topics  .  Alcohol use: No    Alcohol/week: 0.0 oz    Comment: none per pt.  . Drug use: No     Family Hx: The patient's family history includes Arrhythmia in his sister; Cancer in his father; Heart attack in his maternal grandmother; Heart disease in his mother, sister, and sister; Hypertension in his father; Kidney failure in his sister. There is no history of Stroke or Colon cancer.  ROS:   Please see the history of present illness.    ROS All other systems reviewed and are negative.   EKGs/Labs/Other Test Reviewed:    EKG:  EKG is  ordered today.  The ekg ordered today demonstrates normal sinus rhythm, heart rate 68, leftward axis, LVH, nonspecific ST-T wave changes, QTC 452, similar to prior tracing 02/27/2017  Recent Labs: 06/09/2016: ALT 16; BUN 17; Creatinine, Ser 1.31; Hemoglobin 13.1; Platelets 185.0; Potassium 4.2; Sodium 141; TSH 2.03   Recent Lipid Panel Lab Results  Component Value Date/Time   CHOL 136 06/09/2016 09:52 AM   TRIG 49.0 06/09/2016 09:52 AM   HDL 42.70 06/09/2016 09:52 AM   CHOLHDL 3 06/09/2016 09:52 AM   LDLCALC 83 06/09/2016 09:52 AM   LDLDIRECT 184.4 02/10/2013 09:59 AM    Physical Exam:    VS:  BP (!) 158/82   Pulse 68   Ht 5' 11" (1.803 m)   Wt 249 lb (112.9 kg)   SpO2 99%   BMI 34.73 kg/m     Wt Readings from Last 3 Encounters:  05/30/17 249 lb (112.9 kg)  02/27/17 244 lb 6.4 oz (110.9 kg)  06/13/16 240 lb 6.4 oz (109 kg)     Physical Exam  Constitutional: He is oriented to person, place, and time. He appears well-developed and well-nourished. No distress.  HENT:  Head: Normocephalic and atraumatic.  Neck: Neck supple. No JVD present.  Cardiovascular: Normal rate, regular rhythm, S1 normal and S2 normal.  No murmur heard. Pulmonary/Chest: Breath sounds normal. He has no rales.  Abdominal: Soft. There is no hepatomegaly.  Musculoskeletal: He exhibits no edema.  Neurological: He is alert and oriented to person, place, and time.  Skin: Skin  is warm and dry.    ASSESSMENT & PLAN:    Coronary artery disease involving native coronary artery of native heart without angina pectoris Status post CABG in 2016.  He denies angina.  Continue aspirin, statin, beta-blocker.  Essential hypertension Blood pressure remains above target.  He could not tolerate carvedilol.  He is back on metoprolol.  -Continue current dose of metoprolol, lisinopril  -Increase amlodipine to 10 mg daily  -Monitor blood pressure and send readings after 2 weeks  -If remains above target, consider increasing lisinopril to 40 mg daily  Hyperlipidemia Continue high-dose statin.  Arrange follow-up fasting CMET, lipids.   Dispo:  Return in about 6 months (around 11/30/2017) for Routine Follow Up, w/ Dr. Burt Knack.   Medication Adjustments/Labs and Tests Ordered: Current medicines are reviewed at length with the patient today.  Concerns regarding medicines are outlined above.  Tests Ordered: Orders Placed This Encounter  Procedures  . Comp Met (CMET)  . Lipid Profile  . EKG 12-Lead   Medication Changes: Meds ordered this encounter  Medications  . DISCONTD: amLODipine (NORVASC) 10 MG tablet    Sig: Take 1 tablet (10 mg total) by mouth daily.    Dispense:  180 tablet    Refill:  3  . omeprazole (PRILOSEC) 20 MG capsule    Sig: Take 1 capsule (20 mg total) by mouth daily.    Dispense:  90 capsule    Refill:  3  . amLODipine (NORVASC) 10 MG tablet    Sig: Take 1 tablet (10 mg total) by mouth daily.    Dispense:  90 tablet    Refill:  3    Signed, Richardson Dopp, PA-C  05/30/2017 [facility] [address], [place], [place]  [address] Phone: [phone]; Fax: [phone]

## 2017-06-11 ENCOUNTER — Ambulatory Visit: Payer: Medicare HMO | Admitting: Endocrinology

## 2017-06-11 ENCOUNTER — Encounter: Payer: Self-pay | Admitting: Endocrinology

## 2017-06-11 VITALS — BP 162/84 | HR 78 | Wt 247.4 lb

## 2017-06-11 DIAGNOSIS — E1159 Type 2 diabetes mellitus with other circulatory complications: Secondary | ICD-10-CM

## 2017-06-11 DIAGNOSIS — E7800 Pure hypercholesterolemia, unspecified: Secondary | ICD-10-CM

## 2017-06-11 DIAGNOSIS — R739 Hyperglycemia, unspecified: Secondary | ICD-10-CM

## 2017-06-11 DIAGNOSIS — E538 Deficiency of other specified B group vitamins: Secondary | ICD-10-CM | POA: Diagnosis not present

## 2017-06-11 DIAGNOSIS — R972 Elevated prostate specific antigen [PSA]: Secondary | ICD-10-CM

## 2017-06-11 DIAGNOSIS — I10 Essential (primary) hypertension: Secondary | ICD-10-CM

## 2017-06-11 DIAGNOSIS — Z0000 Encounter for general adult medical examination without abnormal findings: Secondary | ICD-10-CM | POA: Diagnosis not present

## 2017-06-11 LAB — BASIC METABOLIC PANEL
BUN: 24 mg/dL — AB (ref 6–23)
CHLORIDE: 103 meq/L (ref 96–112)
CO2: 29 mEq/L (ref 19–32)
Calcium: 9.4 mg/dL (ref 8.4–10.5)
Creatinine, Ser: 1.37 mg/dL (ref 0.40–1.50)
GFR: 66.2 mL/min (ref >60.00)
GLUCOSE: 92 mg/dL (ref 70–99)
POTASSIUM: 4.9 meq/L (ref 3.5–5.1)
Sodium: 139 mEq/L (ref 135–145)

## 2017-06-11 LAB — URINALYSIS, ROUTINE W REFLEX MICROSCOPIC
Bilirubin Urine: NEGATIVE
Hgb urine dipstick: NEGATIVE
KETONES UR: NEGATIVE
Leukocytes, UA: NEGATIVE
Nitrite: NEGATIVE
PH: 6 (ref 5.0–8.0)
SPECIFIC GRAVITY, URINE: 1.02 (ref 1.000–1.030)
Total Protein, Urine: 30 — AB
URINE GLUCOSE: NEGATIVE
UROBILINOGEN UA: 0.2 (ref 0.0–1.0)

## 2017-06-11 LAB — CBC WITH DIFFERENTIAL/PLATELET
BASOS PCT: 1 % (ref 0.0–3.0)
Basophils Absolute: 0.1 10*3/uL (ref 0.0–0.1)
EOS ABS: 0.3 10*3/uL (ref 0.0–0.7)
EOS PCT: 4.4 % (ref 0.0–5.0)
HCT: 40.7 % (ref 39.0–52.0)
Hemoglobin: 13.4 g/dL (ref 13.0–17.0)
Lymphocytes Relative: 17.7 % (ref 12.0–46.0)
Lymphs Abs: 1.3 10*3/uL (ref 0.7–4.0)
MCHC: 33 g/dL (ref 30.0–36.0)
MCV: 80.5 fl (ref 78.0–100.0)
MONO ABS: 0.9 10*3/uL (ref 0.1–1.0)
Monocytes Relative: 12.2 % — ABNORMAL HIGH (ref 3.0–12.0)
Neutro Abs: 4.9 10*3/uL (ref 1.4–7.7)
Neutrophils Relative %: 64.7 % (ref 43.0–77.0)
Platelets: 179 10*3/uL (ref 150.0–400.0)
RBC: 5.06 Mil/uL (ref 4.22–5.81)
RDW: 15.2 % (ref 11.5–15.5)
WBC: 7.6 10*3/uL (ref 4.0–10.5)

## 2017-06-11 LAB — LIPID PANEL
CHOLESTEROL: 150 mg/dL (ref 0–200)
HDL: 44.3 mg/dL (ref >39.00)
LDL CALC: 88 mg/dL (ref 0–99)
NonHDL: 105.2
TRIGLYCERIDES: 86 mg/dL (ref 0.0–149.0)
Total CHOL/HDL Ratio: 3
VLDL: 17.2 mg/dL (ref 0.0–40.0)

## 2017-06-11 LAB — PSA: PSA: 5.61 ng/mL — ABNORMAL HIGH (ref 0.10–4.00)

## 2017-06-11 LAB — POCT GLYCOSYLATED HEMOGLOBIN (HGB A1C): Hemoglobin A1C: 5.5 % (ref 4.0–5.6)

## 2017-06-11 LAB — HEPATIC FUNCTION PANEL
ALT: 16 U/L (ref 0–53)
AST: 20 U/L (ref 0–37)
Albumin: 4.2 g/dL (ref 3.5–5.2)
Alkaline Phosphatase: 96 U/L (ref 39–117)
BILIRUBIN DIRECT: 0.2 mg/dL (ref 0.0–0.3)
BILIRUBIN TOTAL: 0.7 mg/dL (ref 0.2–1.2)
TOTAL PROTEIN: 6.9 g/dL (ref 6.0–8.3)

## 2017-06-11 LAB — VITAMIN B12: Vitamin B-12: 379 pg/mL (ref 211–911)

## 2017-06-11 LAB — TSH: TSH: 2.06 u[IU]/mL (ref 0.35–4.50)

## 2017-06-11 NOTE — Progress Notes (Signed)
Subjective:    Patient ID: Shane [name], male    DOB: 02/09/1950, 67 y.o.   MRN: [id number]  HPI Pt is here for regular wellness examination, and is feeling pretty well in general, and says chronic med probs are stable, except as noted below cardiol treats BP.   Past Medical History:  Diagnosis Date  . Asthma    PMH:as a child  . Carotid stenosis    a. Carotid US 3/16:  bilat ICA 1-39% // b. Carotid US 6/18: Stable bilateral 1-39 ICA  . CIGARETTE SMOKER 11/30/2009   Qualifier: Diagnosis of  By: Loanne Drilling MD, Jacelyn Pi   . Colon polyps 06/17/2011  . Coronary artery disease    a. Myoview 3/16:  Inf ischemia, EF 48%, int risk;  b.  LHC 3/16:  3 v CAD,  c.  s/p CABG x 4 03/2014  . DIABETES MELLITUS, TYPE II 09/25/2006  . Diverticulosis 06/17/2011  . GERD (gastroesophageal reflux disease)   . Hx of cardiovascular stress test    Lexiscan Myoview 3/16:  Inferior ischemia, EF 48%, Intermediate Risk  . Hx of echocardiogram    a.  Echo 3/16:  EF 50-55%, Gr 1 DD  . HYPERCHOLESTEROLEMIA   . HYPERTENSION   . INSOMNIA 09/10/2008  . PSA, INCREASED 11/30/2009  . RENAL INSUFFICIENCY 09/25/2006  . Tinnitus   . Wears glasses     Past Surgical History:  Procedure Laterality Date  . CATARACT EXTRACTION W/ INTRAOCULAR LENS  IMPLANT, BILATERAL    . COLONOSCOPY    . CORONARY ARTERY BYPASS GRAFT N/A 03/23/2014   Procedure: CORONARY ARTERY BYPASS GRAFTING (CABG), ON PUMP, TIMES FOUR, USING LEFT INTERNAL MAMMARY ARTERY, BILATERAL GREATER SAPHENOUS VEINS HARVESTED ENDOSCOPICALLY;  Surgeon: Ivin Poot, MD;  Location: [facility];  Service: Open Heart Surgery;  Laterality: N/A;  . HERNIA REPAIR    . LEFT HEART CATHETERIZATION WITH CORONARY ANGIOGRAM N/A 03/18/2014   Procedure: LEFT HEART CATHETERIZATION WITH CORONARY ANGIOGRAM;  Surgeon: [name], MD;  Location: [facility] CATH LAB;  Service: Cardiovascular;  Laterality: N/A;  . POLYPECTOMY    . TEE WITHOUT CARDIOVERSION N/A 03/23/2014   Procedure: TRANSESOPHAGEAL  ECHOCARDIOGRAM (TEE);  Surgeon: Ivin Poot, MD;  Location: [facility];  Service: Open Heart Surgery;  Laterality: N/A;  . TONSILLECTOMY      Social History   Socioeconomic History  . Marital status: Married    Spouse name: Not on file  . Number of children: Not on file  . Years of education: Not on file  . Highest education level: Not on file  Occupational History  . 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 Financial resource strain: Not on file  . Food insecurity:    Worry: Not on file    Inability: Not on file  . Transportation needs:    Medical: Not on file    Non-medical: Not on file  Tobacco Use  . Smoking status: Former Smoker    Packs/day: 2.00    Years: 50.00    Pack years: 100.00    Types: Cigarettes    Last attempt to quit: 03/17/2014    Years since quitting: 3.2  . Smokeless tobacco: Never Used  . Tobacco comment: none  Substance and Sexual Activity  . Alcohol use: No    Alcohol/week: 0.0 oz    Comment: none per pt.  . Drug use: No  . Sexual activity: Not on file  Lifestyle  . Physical activity:    Days per week: Not  on file    Minutes per session: Not on file  . Stress: Not on file  Relationships  . Social connections:    Talks on phone: Not on file    Gets together: Not on file    Attends religious service: Not on file    Active member of club or organization: Not on file    Attends meetings of clubs or organizations: Not on file    Relationship status: Not on file  . Intimate partner violence:    Fear of current or ex partner: Not on file    Emotionally abused: Not on file    Physically abused: Not on file    Forced sexual activity: Not on file  Other Topics Concern  . 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 Garlic 0086 MG CAPS Take by mouth.    [name] Kitchen amLODipine (NORVASC) 10 MG tablet Take 1 tablet (10 mg  total) by mouth daily. 90 tablet 3  . aspirin 81 MG tablet Take 1 tablet (81 mg total) by mouth daily.    [name] Kitchen atorvastatin (LIPITOR) 80 MG tablet TAKE 1 TABLET BY MOUTH ONCE DAILY 90 tablet 1  . lisinopril (PRINIVIL,ZESTRIL) 20 MG tablet Take 1 tablet (20 mg total) by mouth daily. 90 tablet 3  . metoprolol tartrate (LOPRESSOR) 50 MG tablet Take 50 mg by mouth 2 (two) times daily.    [name] Kitchen omeprazole (PRILOSEC) 20 MG capsule Take 1 capsule (20 mg total) by mouth daily. 90 capsule 3  . triamcinolone cream (KENALOG) 0.1 % Apply 1 application topically 2 (two) times daily. (Patient not taking: Reported on 06/11/2017) 30 g 0   No current facility-administered medications on file prior to visit.     Allergies  Allergen Reactions  . Coreg [Carvedilol] Other (See Comments)    Did not feel well while taking    Family History  Problem Relation Age of Onset  . Cancer Father   . Hypertension Father   . Heart disease Mother   . Heart disease Sister   . Kidney failure Sister   . Heart disease Sister   . Arrhythmia Sister   . Heart attack Maternal Grandmother   . Stroke Neg Hx   . Colon cancer Neg Hx     BP (!) 162/84   Pulse 78   Wt 247 lb 6.4 oz (112.2 kg)   SpO2 97%   BMI 34.51 kg/m    Review of Systems Denies fever, fatigue, diplopia, chest pain, sob, back pain, anxiety, cold intolerance, BRBPR, hematuria, syncope, allergy sxs, easy bruising, and rash. He has slight tingling of the feet, and right otalgia     Objective:   Physical Exam VS: see vs page GEN: no distress HEAD: head: no deformity eyes: no periorbital swelling, no proptosis external nose and ears are normal mouth: no lesion seen NECK: supple, thyroid is not enlarged CHEST WALL: no deformity LUNGS: clear to auscultation BREASTS:  No gynecomastia CV: reg rate and rhythm, no murmur ABD: abdomen is soft, nontender.  no hepatosplenomegaly.  not distended.  Small self-reducing ventral hernia RECTAL/PROSTATE: sees urology.    MUSCULOSKELETAL: muscle bulk and strength are grossly normal.  no obvious joint swelling.  gait is normal and steady EXTEMITIES: no deformity.  no ulcer on the feet.  feet are of normal color and temp.  1+ bilat leg  edema. There is bilateral onychomycosis of the toenails.   PULSES: dorsalis pedis intact bilat.  no carotid bruit NEURO:  cn 2-12 grossly intact.   readily moves all 4's.  sensation is intact to touch on the feet SKIN:  Normal texture and temperature.  No rash or suspicious lesion is visible.   NODES:  None palpable at the neck PSYCH: alert, well-oriented.  Does not appear anxious nor depressed.     Assessment & Plan:  Wellness visit today, with problems stable, except as noted.    Subjective:   Patient here for Medicare annual wellness visit and management of other chronic and acute problems.     Risk factors: advanced age    51 of Physicians Providing Medical Care to Patient:  See "snapshot"   Activities of Daily Living: In your present state of health, do you have any difficulty performing the following activities (lives with GF)?:  Preparing food and eating?: No  Bathing yourself: No  Getting dressed: No  Using the toilet:No  Moving around from place to place: No  In the past year have you fallen or had a near fall?: No    Home Safety: Has smoke detector and wears seat belts.  firearms are safely stored  Opioid Use: none  Diet and Exercise  Current exercise habits: pt says good Dietary issues discussed: pt reports a healthy diet   Depression Screen  Q1: Over the past two weeks, have you felt down, depressed or hopeless? no  Q2: Over the past two weeks, have you felt little interest or pleasure in doing things? no   The following portions of the patient's history were reviewed and updated as appropriate: allergies, current medications, past family history, past medical history, past social history, past surgical history and problem list.   Review of  Systems  Denies hearing loss, and visual loss Objective:   Vision:  Advertising account executive, so he declines VA today.   Hearing: grossly normal Body mass index:  See vs page Msk: pt easily and quickly performs "get-up-and-go" from a sitting position Cognitive Impairment Assessment: cognition, memory and judgment appear normal.  remembers 3/3 at 5 minutes.  excellent recall.  can easily read and write a sentence.  alert and oriented x 3.     Assessment:   Medicare wellness utd on preventive parameters    Plan:   During the course of the visit the patient was educated and counseled about appropriate screening and preventive services including:        Fall prevention is advised today  Diabetes screening  Nutrition counseling is offered  advanced directives/end of life addressed today:  see healthcare directives hyperlink  Vaccines are updated as needed  Patient Instructions (the written plan) was given to the patient.

## 2017-06-11 NOTE — Progress Notes (Signed)
we discussed code status.  pt requests full code, but would not want to be started or maintained on artificial life-support measures if there was not a reasonable chance of recovery 

## 2017-06-11 NOTE — Patient Instructions (Signed)
Please consider these measures for your health:  minimize alcohol.  Do not use tobacco products.  Have a colonoscopy at least every 10 years from age 67.   Keep firearms safely stored.  Always use seat belts.  have working smoke alarms in your home.  See an eye doctor and dentist regularly.  Never drive under the influence of alcohol or drugs (including prescription drugs).  It is critically important to prevent falling down (keep floor areas well-lit, dry, and free of loose objects.  If you have a cane, walker, or wheelchair, you should use it, even for short trips around the house.  Wear flat-soled shoes.  Also, try not to rush). blood tests are requested for you today.  We'll let you know about the results.  Please come back for a follow-up appointment in 1 year.

## 2017-06-29 ENCOUNTER — Ambulatory Visit (INDEPENDENT_AMBULATORY_CARE_PROVIDER_SITE_OTHER)
Admission: RE | Admit: 2017-06-29 | Discharge: 2017-06-29 | Disposition: A | Discharge status: Home / Self Care | Payer: Medicare HMO | Source: Ambulatory Visit | Attending: Acute Care | Admitting: Acute Care

## 2017-06-29 DIAGNOSIS — Z87891 Personal history of nicotine dependence: Secondary | ICD-10-CM

## 2017-07-04 ENCOUNTER — Other Ambulatory Visit: Payer: Self-pay | Admitting: Acute Care

## 2017-07-04 DIAGNOSIS — Z87891 Personal history of nicotine dependence: Secondary | ICD-10-CM

## 2017-07-04 DIAGNOSIS — Z122 Encounter for screening for malignant neoplasm of respiratory organs: Secondary | ICD-10-CM

## 2017-07-05 ENCOUNTER — Telehealth: Payer: Self-pay | Admitting: Endocrinology

## 2017-07-05 NOTE — Telephone Encounter (Signed)
Is this possible? Please advise.

## 2017-07-05 NOTE — Telephone Encounter (Signed)
Patient would like a note/something in writing saying he is not able to lift heavy items/climb stairs anymore due to his health conditionsso many hernia/operations, triple bypass heart surgery. Please call patient and advise. He is no longer able to lift.

## 2017-07-05 NOTE — Telephone Encounter (Signed)
I need to know what this is for.  That is because, depending on the reason, you may need to get these from the respective specialists.

## 2017-07-09 NOTE — Telephone Encounter (Signed)
Just an FYI. Patient stated that he was trying to file for disability. I stated that usually that was something that was applied for & appropriate paperwork was sent to [place]. He stated that this was because of all the hernia surgeries he's had in the past. He stated that he would check into this & let us know about paperwork.

## 2017-07-28 ENCOUNTER — Other Ambulatory Visit: Payer: Self-pay | Admitting: Physician Assistant

## 2017-08-16 ENCOUNTER — Ambulatory Visit: Payer: Medicare HMO | Admitting: Physician Assistant

## 2017-08-30 ENCOUNTER — Other Ambulatory Visit (INDEPENDENT_AMBULATORY_CARE_PROVIDER_SITE_OTHER): Payer: Medicare PPO

## 2017-08-30 ENCOUNTER — Encounter: Payer: Self-pay | Admitting: Physician Assistant

## 2017-08-30 ENCOUNTER — Ambulatory Visit: Payer: Medicare HMO | Admitting: Physician Assistant

## 2017-08-30 VITALS — BP 142/80 | HR 76 | Ht 71.0 in | Wt 249.0 lb

## 2017-08-30 DIAGNOSIS — Z8719 Personal history of other diseases of the digestive system: Secondary | ICD-10-CM

## 2017-08-30 DIAGNOSIS — R1084 Generalized abdominal pain: Secondary | ICD-10-CM | POA: Diagnosis not present

## 2017-08-30 DIAGNOSIS — Z9889 Other specified postprocedural states: Secondary | ICD-10-CM

## 2017-08-30 DIAGNOSIS — Z86010 Personal history of colonic polyps: Secondary | ICD-10-CM

## 2017-08-30 DIAGNOSIS — R109 Unspecified abdominal pain: Secondary | ICD-10-CM

## 2017-08-30 DIAGNOSIS — Z1211 Encounter for screening for malignant neoplasm of colon: Secondary | ICD-10-CM | POA: Diagnosis not present

## 2017-08-30 LAB — BASIC METABOLIC PANEL
BUN: 25 mg/dL — AB (ref 6–23)
CHLORIDE: 104 meq/L (ref 96–112)
CO2: 27 meq/L (ref 19–32)
Calcium: 9.5 mg/dL (ref 8.4–10.5)
Creatinine, Ser: 1.36 mg/dL (ref 0.40–1.50)
GFR: 55.14 mL/min — AB (ref >60.00)
GLUCOSE: 90 mg/dL (ref 70–99)
POTASSIUM: 4.3 meq/L (ref 3.5–5.1)
SODIUM: 138 meq/L (ref 135–145)

## 2017-08-30 NOTE — Patient Instructions (Addendum)
Your provider has requested that you go to the basement level for lab work before leaving today. Press "B" on the elevator. The lab is located at the first door on the left as you exit the elevator.   You have been scheduled for a CT scan of the abdomen and pelvis at [facility] ([address]---this is in the same building as Press [facility]).   You are scheduled on Tuesday 09-11-2017 at 2:00 PM. You should arrive at 1:45 PM to your appointment time for registration. Please follow the written instructions below on the day of your exam:  WARNING: IF YOU ARE ALLERGIC TO IODINE/X-RAY DYE, PLEASE NOTIFY RADIOLOGY IMMEDIATELY AT [phone]! YOU WILL BE GIVEN A 13 HOUR PREMEDICATION PREP.  1) Do not eat  anything after 10:00 am (4 hours prior to your test) 2) You have been given 2 bottles of oral contrast to drink. The solution may taste better if refrigerated, but do NOT add ice or any other liquid to this solution. Shake well before drinking.    Drink 1 bottle of contrast @ 12:00 noon (2 hours prior to your exam)  Drink 1 bottle of contrast @ 1:00 PM (1 hour prior to your exam)  You may take any medications as prescribed with a small amount of water except for the following: Metformin, Glucophage, Glucovance, Avandamet, Riomet, Fortamet, Actoplus Met, Janumet, Glumetza or Metaglip. The above medications must be held the day of the exam AND 48 hours after the exam.  The purpose of you drinking the oral contrast is to aid in the visualization of your intestinal tract. The contrast solution may cause some diarrhea. Before your exam is started, you will be given a small amount of fluid to drink. Depending on your individual set of symptoms, you may also receive an intravenous injection of x-ray contrast/dye. Plan on being at [facility] for 30 minutes or long, depending on the type of exam you are having performed.  If you have any questions regarding your exam or if you need to  reschedule, you may call the CT department at [phone] between the hours of 8:00 am and 5:00 pm, Monday-Friday.  ________________________________________________________________________

## 2017-08-30 NOTE — Progress Notes (Signed)
Subjective:    Patient ID: Shane [name], male    DOB: October 20, 1950, 67 y.o.   MRN: [id number]  HPI Shane [name] is a pleasant 67 year old African-American male known to Dr. Fuller Plan who was last seen here in May 2017 when he had colonoscopy. He comes in today with complaints of left mid abdominal pain over the past 1 to 2 months. Patient has history of hypertension, coronary artery disease is status post CABG in 2016, has carotid artery disease, adult onset diabetes mellitus, obesity and history of adenomatous colon polyps. Last colonoscopy May 2017 done for follow-up of adenomatous polyps with finding of 7 polyps.  5 of these were 6 to 8 mm in size.  All were removed and path consistent with tubular adenomas and one hyperplastic polyp.  He also was noted to have multiple diverticuli in the transverse and left colon. Patient underwent remote umbilical hernia repair for incarceration in 2006.  Review of his chart shows that he had a small bowel obstruction in 2008 secondary to adhesions and had exploratory lap lysis of adhesions small bowel resection and resection of abdominal wall mesh by Dr. Dalbert Batman.  Patient says he has been noticing a pulling sensation and occasional sharp pains in his left mid abdomen over the past month or so.  He does not recall injuring himself or lifting anything particularly heavy.  He feels the sensation just to the left of his midline incisional scar at the umbilicus.  He has had some improvement in discomfort with bowel movements at times.  No changes in his bowel habits no melena or hematochezia.  Appetite has been fine no change in abdominal discomfort postprandially no nausea or vomiting.  Review of Systems.Pertinent positive and negative review of systems were noted in the above HPI section.  All other review of systems was otherwise negative.  Outpatient Encounter Medications as of 08/30/2017  Medication Sig  . aspirin 81 MG tablet Take 1 tablet (81 mg total) by mouth daily.    [name] Kitchen atorvastatin (LIPITOR) 80 MG tablet TAKE 1 TABLET BY MOUTH ONCE DAILY  . BIOTIN 5000 PO Take by mouth daily.  . Garlic 6004 MG CAPS Take by mouth.  [name] Kitchen omeprazole (PRILOSEC) 20 MG capsule Take 1 capsule (20 mg total) by mouth daily.  . [DISCONTINUED] lisinopril (PRINIVIL,ZESTRIL) 20 MG tablet Take 1 tablet (20 mg total) by mouth daily.  . [DISCONTINUED] metoprolol tartrate (LOPRESSOR) 50 MG tablet Take 50 mg by mouth 2 (two) times daily.  . [DISCONTINUED] triamcinolone cream (KENALOG) 0.1 % Apply 1 application topically 2 (two) times daily.  [name] Kitchen amLODipine (NORVASC) 10 MG tablet Take 1 tablet (10 mg total) by mouth daily.   No facility-administered encounter medications on file as of 08/30/2017.    Allergies  Allergen Reactions  . Coreg [Carvedilol] Other (See Comments)    Did not feel well while taking   Patient Active Problem List   Diagnosis Date Noted  . Hyperglycemia 06/11/2017  . CAD (coronary artery disease) 06/13/2016  . Carotid artery disease ([place]) 06/13/2016  . Cervical pain 04/27/2015  . Bilateral tinnitus 04/27/2015  . Numbness 02/05/2015  . S/P CABG x 4 03/23/2014  . Wellness examination 02/10/2014  . Rectal bleeding 02/10/2014  . Hypogonadism male 10/20/2013  . Impotence of organic origin 08/18/2013  . Vitamin B12 deficiency 02/11/2013  . Closed fracture of unspecified part of upper end of humerus 02/10/2013  . Encounter for long-term (current) use of other medications 07/06/2011  . Colon polyps 06/17/2011  .  Diverticulosis 06/17/2011  . Contact dermatitis 06/16/2011  . CIGARETTE SMOKER 11/30/2009  . PSA, INCREASED 11/30/2009  . INSOMNIA 09/10/2008  . HYPERCHOLESTEROLEMIA 01/22/2007  . Hyperpotassemia 09/25/2006  . Disorder resulting from impaired renal function 09/25/2006  . Essential hypertension 08/24/2006   Social History   Socioeconomic History  . Marital status: Legally Separated    Spouse name: Not on file  . Number of children: Not on file  .  Years of education: Not on file  . Highest education level: Not on file  Occupational History  . 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 Financial resource strain: Not on file  . Food insecurity:    Worry: Not on file    Inability: Not on file  . Transportation needs:    Medical: Not on file    Non-medical: Not on file  Tobacco Use  . Smoking status: Former Smoker    Packs/day: 2.00    Years: 50.00    Pack years: 100.00    Types: Cigarettes    Last attempt to quit: 03/17/2014    Years since quitting: 3.4  . Smokeless tobacco: Never Used  . Tobacco comment: none  Substance and Sexual Activity  . Alcohol use: No    Alcohol/week: 0.0 standard drinks    Comment: none per pt.  . Drug use: No  . Sexual activity: Not on file  Lifestyle  . Physical activity:    Days per week: Not on file    Minutes per session: Not on file  . Stress: Not on file  Relationships  . Social connections:    Talks on phone: Not on file    Gets together: Not on file    Attends religious service: Not on file    Active member of club or organization: Not on file    Attends meetings of clubs or organizations: Not on file    Relationship status: Not on file  . Intimate partner violence:    Fear of current or ex partner: Not on file    Emotionally abused: Not on file    Physically abused: Not on file    Forced sexual activity: Not on file  Other Topics Concern  . 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 Bergsma family history includes Arrhythmia in his sister; Cancer in his father; Heart attack in his maternal grandmother; Heart disease in his mother, sister, and sister; Hypertension in his father; Kidney failure in his sister.      Objective:    Vitals:   08/30/17 1110  BP: (!) 142/80  Pulse: 76    Physical Exam; developed older African-American male in no acute distress, accompanied by his wife both pleasant blood pressure 142/80 pulse  76, height 5 foot 11, weight 249, BMI 34.7.  HEENT; nontraumatic normocephalic EOMI PERRLA sclera anicteric buccal mucosa moist, Cardiovascular; regular rate and rhythm with S1-S2 no murmur rub gallop, sternal incisional scar Pulmonary ;clear bilaterally, Abdomen; obese, soft, he has a midline incisional scar, there is a small protuberance just to the right of the umbilicus, he is tender to the left of the umbilicus where a believe there is a small hernia defect, no palpable mass or hepatosplenomegaly he has some generalized tenderness in the left mid abdomen, bowel sounds are present, Rectal; exam not done, Extreme; no clubbing cyanosis or edema skin warm and dry, Neuro psych ;alert and oriented, grossly nonfocal mood and affect appropriate  Assessment & Plan:   #42 67 year old African-American male with 4 to 6-week history of left mid abdominal discomfort with pulling type pain and intermittent sharp pains to the left of his midline incisional scar.  I suspect this is secondary to either recurrent abdominal wall hernia, adhesions or partial low-grade obstruction secondary to adhesions and mesh an area of previous umbilical hernia repair. Patient required exploratory lap lysis of adhesions and small bowel resection as well as resection of abdominal wall mesh in 2008  #2 history of adenomatous colon polyps up-to-date with colonoscopy last done May 2017 with 7 polyps removed.  He is indicated for 3-year interval follow-up in May 2020  #3 diverticulosis #4.  Coronary artery disease status post CABG 2016 #5.  Hypertension #6.  Adult onset diabetes mellitus #7.  Carotid artery disease  Plan; Patient will be scheduled for CT of the abdomen and pelvis with contrast.  Check be met today  Further plans pending review results of CT.  Suspect he will require surgical referral Plan for follow-up colonoscopy May 2020.[name] Kitchen  Ebony Yorio Genia Harold PA-C 08/30/2017   Cc: Renato Shin, MD

## 2017-09-03 NOTE — Progress Notes (Signed)
Reviewed and agree with management plan.  Tarron Krolak T. Arrayah Connors, MD FACG 

## 2017-09-11 ENCOUNTER — Ambulatory Visit (INDEPENDENT_AMBULATORY_CARE_PROVIDER_SITE_OTHER)
Admission: RE | Admit: 2017-09-11 | Discharge: 2017-09-11 | Disposition: A | Discharge status: Home / Self Care | Payer: Medicare PPO | Source: Ambulatory Visit | Attending: Physician Assistant | Admitting: Physician Assistant

## 2017-09-11 DIAGNOSIS — Z8719 Personal history of other diseases of the digestive system: Secondary | ICD-10-CM

## 2017-09-11 DIAGNOSIS — Z1211 Encounter for screening for malignant neoplasm of colon: Secondary | ICD-10-CM

## 2017-09-11 DIAGNOSIS — R109 Unspecified abdominal pain: Secondary | ICD-10-CM

## 2017-09-11 DIAGNOSIS — Z86010 Personal history of colonic polyps: Secondary | ICD-10-CM

## 2017-09-11 DIAGNOSIS — Z9889 Other specified postprocedural states: Secondary | ICD-10-CM

## 2017-09-11 MED ORDER — IOPAMIDOL (ISOVUE-300) INJECTION 61%
100.0000 mL | Freq: Once | INTRAVENOUS | Status: AC | PRN
  Administered 2017-09-11: 100 mL via INTRAVENOUS

## 2017-10-22 ENCOUNTER — Other Ambulatory Visit: Payer: Self-pay | Admitting: Endocrinology

## 2018-01-03 ENCOUNTER — Telehealth: Payer: Self-pay | Admitting: Endocrinology

## 2018-01-03 DIAGNOSIS — H9319 Tinnitus, unspecified ear: Secondary | ICD-10-CM

## 2018-01-03 NOTE — Telephone Encounter (Signed)
Please advise 

## 2018-01-03 NOTE — Telephone Encounter (Signed)
Patient stated that he would like Dr Loanne Drilling to send a referral to an [facility]. He did not have a specific doctor just anyone that Dr Loanne Drilling would recommend

## 2018-01-04 NOTE — Telephone Encounter (Signed)
Pt stated that he is having ringing in his ear and some pain, purchased OTC medication to try to clean it out and help with the pain,please advise

## 2018-01-04 NOTE — Telephone Encounter (Signed)
I need to know the reason 

## 2018-01-05 NOTE — Telephone Encounter (Signed)
I could, but it would be faster to see urgent care

## 2018-01-07 NOTE — Telephone Encounter (Signed)
Called pt and informed about referral as well as referral process. Verbalized acceptance and understanding.

## 2018-01-07 NOTE — Telephone Encounter (Signed)
Ok, I did referral 

## 2018-01-07 NOTE — Telephone Encounter (Signed)
Spoke to pt and he would like referral placed

## 2018-01-07 NOTE — Addendum Note (Signed)
Addended by: Renato Shin on: 01/07/2018 11:55 AM   Modules accepted: Orders

## 2018-01-24 DIAGNOSIS — H9201 Otalgia, right ear: Secondary | ICD-10-CM | POA: Diagnosis not present

## 2018-01-24 DIAGNOSIS — H9191 Unspecified hearing loss, right ear: Secondary | ICD-10-CM | POA: Diagnosis not present

## 2018-01-24 DIAGNOSIS — H903 Sensorineural hearing loss, bilateral: Secondary | ICD-10-CM | POA: Diagnosis not present

## 2018-02-08 DIAGNOSIS — R972 Elevated prostate specific antigen [PSA]: Secondary | ICD-10-CM | POA: Diagnosis not present

## 2018-02-15 DIAGNOSIS — N401 Enlarged prostate with lower urinary tract symptoms: Secondary | ICD-10-CM | POA: Diagnosis not present

## 2018-02-15 DIAGNOSIS — R3912 Poor urinary stream: Secondary | ICD-10-CM | POA: Diagnosis not present

## 2018-02-15 DIAGNOSIS — R972 Elevated prostate specific antigen [PSA]: Secondary | ICD-10-CM | POA: Diagnosis not present

## 2018-02-15 DIAGNOSIS — N5201 Erectile dysfunction due to arterial insufficiency: Secondary | ICD-10-CM | POA: Diagnosis not present

## 2018-03-08 LAB — HM DIABETES EYE EXAM

## 2018-03-18 DIAGNOSIS — H52203 Unspecified astigmatism, bilateral: Secondary | ICD-10-CM | POA: Diagnosis not present

## 2018-03-18 DIAGNOSIS — Z961 Presence of intraocular lens: Secondary | ICD-10-CM | POA: Diagnosis not present

## 2018-03-18 DIAGNOSIS — H4322 Crystalline deposits in vitreous body, left eye: Secondary | ICD-10-CM | POA: Diagnosis not present

## 2018-03-18 DIAGNOSIS — H524 Presbyopia: Secondary | ICD-10-CM | POA: Diagnosis not present

## 2018-03-18 DIAGNOSIS — E119 Type 2 diabetes mellitus without complications: Secondary | ICD-10-CM | POA: Diagnosis not present

## 2018-05-06 ENCOUNTER — Other Ambulatory Visit: Payer: Self-pay | Admitting: Endocrinology

## 2018-05-06 NOTE — Telephone Encounter (Signed)
Please refill x 3 months Further refills would have to be considered by new PCP   

## 2018-05-15 ENCOUNTER — Other Ambulatory Visit: Payer: Self-pay | Admitting: Cardiovascular Disease

## 2018-05-15 ENCOUNTER — Telehealth: Payer: Self-pay | Admitting: Endocrinology

## 2018-05-15 MED ORDER — ATORVASTATIN CALCIUM 80 MG PO TABS: 80.0000 mg | ORAL_TABLET | Freq: Every day | ORAL | 0 refills | Status: DC

## 2018-05-15 NOTE — Telephone Encounter (Signed)
Pt calling requesting a refill on atorvastatin. Dr. Loanne Drilling has been refilling this medication, but when pt called for a refill, Dr. Loanne Drilling refuse to refill the medication and advised pt to call Dr. Burt Knack to refill this medication. Would Dr. Burt Knack like to refill this medication? Pt stated that he is out of this medication. Please address

## 2018-05-15 NOTE — Telephone Encounter (Signed)
I am fine to refill this.  I reviewed his last labs from June 2019 demonstrating normal hepatic function.

## 2018-05-15 NOTE — Telephone Encounter (Signed)
Pt's medication was sent to pt's pharmacy as requested. Confirmation received.  °

## 2018-05-15 NOTE — Telephone Encounter (Signed)
ERROR

## 2018-05-15 NOTE — Telephone Encounter (Signed)
MEDICATION: atorvastatin 80 MG  PHARMACY:  [facility]  IS THIS A 90 DAY SUPPLY : yes  IS PATIENT OUT OF MEDICATION: yes  IF NOT; HOW MUCH IS LEFT:   LAST APPOINTMENT DATE: @4 /27/2020  NEXT APPOINTMENT DATE:@6 /03/2018  DO WE HAVE YOUR PERMISSION TO LEAVE A DETAILED MESSAGE:  OTHER COMMENTS:    **Let patient know to contact pharmacy at the end of the day to make sure medication is ready. **  ** Please notify patient to allow 48-72 hours to process**  **Encourage patient to contact the pharmacy for refills or they can request refills through [facility]**

## 2018-05-15 NOTE — Telephone Encounter (Signed)
New Message     *STAT* If patient is at the pharmacy, call can be transferred to refill team.   1. Which medications need to be refilled? (please list name of each medication and dose if known) atorvastatin 80 mg   2. Which pharmacy/location (including street and city if local pharmacy) is medication to be sent to? [place] on [address]  3. Do they need a 30 day or 90 day supply? [place]

## 2018-05-15 NOTE — Telephone Encounter (Signed)
Per VO by Dr. Loanne Drilling, refill needs to come from PCP. Called pt and advised to call Dr. Antionette Char office to request refill. Verbalized acceptance and understanding.

## 2018-05-17 ENCOUNTER — Telehealth: Payer: Self-pay | Admitting: Cardiovascular Disease

## 2018-05-17 ENCOUNTER — Telehealth: Payer: Self-pay | Admitting: Physician Assistant

## 2018-05-17 NOTE — Telephone Encounter (Signed)
°  5/8 :    Left VM for patient to return my call regarding visit on 05/20/2018. Needing consent from patient for Virtual visit as well. When patient calls transfer call to me. Cell [phone]

## 2018-05-17 NOTE — Telephone Encounter (Signed)
Pt c/o swelling: STAT is pt has developed SOB within 24 hours  1) How much weight have you gained and in what time span?no  2) If swelling, where is the swelling located? Right lower leg, ankle and foot  3) Are you currently taking a fluid pill? no  4) Are you currently SOB? no  5) Do you have a log of your daily weights (if so, list)? no  6) Have you gained 3 pounds in a day or 5 pounds in a week? no  7) Have you traveled recently? No  Patient is having some pain and swelling in right foot near ankle. Patient is not sure what he needs to do.

## 2018-05-17 NOTE — Telephone Encounter (Signed)
Patient is returning your call.  

## 2018-05-17 NOTE — Telephone Encounter (Signed)
°  NEW MESSAGE - 5/8 :  Spoke to patient.     VIDEO/MyChart App visit on 05/20/2018      Consent confirmed via MyChart     05/17/2018

## 2018-05-17 NOTE — Telephone Encounter (Signed)
I spoke with pt. He reports swelling in right ankle area for last week.  Was working outside and on his feet a lot. Thinks his shoes may have rubbed area. No skin breakdown.  Area is sore.  States when he does have swelling it is usually only in his right leg. He does not weigh daily and I asked him to start to weigh daily in the morning and keep record of readings. Pt denies any shortness of breath. Pt is past due for follow up in office so I made virtual appointment for him to see Richardson Dopp, PA on May 11,2020 at 10:45.  Pt does have my chart. I told pt we would call him back with instructions for this appointment.   I advised pt to keep legs elevated when possible.

## 2018-05-19 NOTE — Progress Notes (Signed)
Virtual Visit via Video Note   This visit type was conducted due to national recommendations for restrictions regarding the COVID-19 Pandemic (e.g. social distancing) in an effort to limit this patient's exposure and mitigate transmission in our community.  Due to his co-morbid illnesses, this patient is at least at moderate risk for complications without adequate follow up.  This format is felt to be most appropriate for this patient at this time.  All issues noted in this document were discussed and addressed.  A limited physical exam was performed with this format.  Please refer to the patient's chart for his consent to telehealth for [facility].   Date:  05/20/2018   ID:  Shane [name], DOB December 01, 1950, MRN [id number]  Patient Location: Home Provider Location: Home  PCP:  Renato Shin, MD  Cardiologist:  [name], MD   Electrophysiologist:  None   Evaluation Performed:  Follow-Up Visit  Chief Complaint:  Follow up on CAD; R leg swelling  History of Present Illness:    Shane [name] is a 68 y.o. male with coronary artery disease status post coronary artery bypass grafting in 2016, diabetes, hypertension, hyperlipidemia.   He was last seen in 05/2017.  Today he notes he is doing well.  He has not had chest discomfort, significant shortness of breath, orthopnea, syncope.  He has had some right lower extremity swelling.  This is mainly in his ankle.  It seems to be worse with prolonged standing and gets better with elevation.  He has been on a job recently and has been on his feet for prolonged amounts of time.  His shoes do not fit well and seem to irritate his ankle.  He has not had any long trips, admissions to the hospital or injuries to his leg.  The patient does not have symptoms concerning for COVID-19 infection (fever, chills, cough, or new shortness of breath).    Past Medical History:  Diagnosis Date  . Asthma    PMH:as a child  . Carotid stenosis    a. Carotid  US 3/16:  bilat ICA 1-39% // b. Carotid US 6/18: Stable bilateral 1-39 ICA  . CIGARETTE SMOKER 11/30/2009   Qualifier: Diagnosis of  By: Loanne Drilling MD, Jacelyn Pi   . Colon polyps 06/17/2011  . Coronary artery disease    a. Myoview 3/16:  Inf ischemia, EF 48%, int risk;  b.  LHC 3/16:  3 v CAD,  c.  s/p CABG x 4 03/2014  . DIABETES MELLITUS, TYPE II 09/25/2006  . Diverticulosis 06/17/2011  . GERD (gastroesophageal reflux disease)   . Hx of cardiovascular stress test    Lexiscan Myoview 3/16:  Inferior ischemia, EF 48%, Intermediate Risk  . Hx of echocardiogram    a.  Echo 3/16:  EF 50-55%, Gr 1 DD  . HYPERCHOLESTEROLEMIA   . HYPERTENSION   . INSOMNIA 09/10/2008  . PSA, INCREASED 11/30/2009  . RENAL INSUFFICIENCY 09/25/2006  . Tinnitus   . Wears glasses    Past Surgical History:  Procedure Laterality Date  . CATARACT EXTRACTION W/ INTRAOCULAR LENS  IMPLANT, BILATERAL    . COLONOSCOPY    . CORONARY ARTERY BYPASS GRAFT N/A 03/23/2014   Procedure: CORONARY ARTERY BYPASS GRAFTING (CABG), ON PUMP, TIMES FOUR, USING LEFT INTERNAL MAMMARY ARTERY, BILATERAL GREATER SAPHENOUS VEINS HARVESTED ENDOSCOPICALLY;  Surgeon: Ivin Poot, MD;  Location: [facility];  Service: Open Heart Surgery;  Laterality: N/A;  . HERNIA REPAIR    . LEFT HEART CATHETERIZATION  WITH CORONARY ANGIOGRAM N/A 03/18/2014   Procedure: LEFT HEART CATHETERIZATION WITH CORONARY ANGIOGRAM;  Surgeon: [name], MD;  Location: [facility] CATH LAB;  Service: Cardiovascular;  Laterality: N/A;  . POLYPECTOMY    . TEE WITHOUT CARDIOVERSION N/A 03/23/2014   Procedure: TRANSESOPHAGEAL ECHOCARDIOGRAM (TEE);  Surgeon: Ivin Poot, MD;  Location: [facility];  Service: Open Heart Surgery;  Laterality: N/A;  . TONSILLECTOMY       Current Meds  Medication Sig  . amLODipine (NORVASC) 10 MG tablet Take 1 tablet (10 mg total) by mouth daily.  [name] Kitchen aspirin 81 MG tablet Take 1 tablet (81 mg total) by mouth daily.  [name] Kitchen atorvastatin (LIPITOR) 80 MG tablet Take 1 tablet (80  mg total) by mouth daily. Please make overdue appt with Dr. Burt Knack before anymore refills. 1st attempt  . BIOTIN 5000 PO Take 5,000 mg by mouth daily.   . Garlic 5053 MG CAPS Take 1,000 mg by mouth.   [name] Kitchen omeprazole (PRILOSEC) 20 MG capsule Take 1 capsule (20 mg total) by mouth daily.     Allergies:   Coreg [carvedilol]   Social History   Tobacco Use  . Smoking status: Former Smoker    Packs/day: 2.00    Years: 50.00    Pack years: 100.00    Types: Cigarettes    Last attempt to quit: 03/17/2014    Years since quitting: 4.1  . Smokeless tobacco: Never Used  . Tobacco comment: none  Substance Use Topics  . Alcohol use: No    Alcohol/week: 0.0 standard drinks    Comment: none per pt.  . Drug use: No     Family Hx: The patient's family history includes Arrhythmia in his sister; Cancer in his father; Heart attack in his maternal grandmother; Heart disease in his mother, sister, and sister; Hypertension in his father; Kidney failure in his sister. There is no history of Stroke or Colon cancer.  ROS:   Please see the history of present illness.    All other systems reviewed and are negative.   Prior CV studies:   The following studies were reviewed today:  Carotid US 06/29/16 Stable bilateral ICA 1-39   Cardiac cath 03/18/14 LM: No significant stenosis. LAD:  At origin of D1 diffuse 80-90% stenosis with a plaque ulceration. D1 90% stenosis.  LCx: Proximal 75% then long 50% RCA: Mid occluded EF: 55%   Echocardiogram 03/20/14 EF 50% to 55%. Grade 1 diastolic dysfunction.   Carotid US 03/20/14 Bilateral - 1% to 39% ICA stenosis.   Myoview 03/11/14 Intermediate risk stress nuclear study with a moderate-sized and intensity, reversible inferior and inferoapical perfusion defect suggestive of ischemia.  LV Ejection Fraction: 48%.  LV Wall Motion:  Inferior hypokinesis   Labs/Other Tests and Data Reviewed:    Chest CT w/o contrast 06/29/17 IMPRESSION: 1. Lung-RADS 2, benign  appearance or behavior. Continue annual screening with low-dose chest CT without contrast in 12 months. 2.  Aortic atherosclerosis (ICD10-I70.0). 3. Esophageal air fluid level suggests dysmotility or gastroesophageal reflux.  EKG:  No ECG reviewed.  Recent Labs: 06/11/2017: ALT 16; Hemoglobin 13.4; Platelets 179.0; TSH 2.06 08/30/2017: BUN 25; Creatinine, Ser 1.36; Potassium 4.3; Sodium 138   Recent Lipid Panel Lab Results  Component Value Date/Time   CHOL 150 06/11/2017 09:20 AM   TRIG 86.0 06/11/2017 09:20 AM   HDL 44.30 06/11/2017 09:20 AM   CHOLHDL 3 06/11/2017 09:20 AM   LDLCALC 88 06/11/2017 09:20 AM   LDLDIRECT 184.4 02/10/2013 09:59 AM  Wt Readings from Last 3 Encounters:  05/20/18 260 lb (117.9 kg)  08/30/17 249 lb (112.9 kg)  06/11/17 247 lb 6.4 oz (112.2 kg)     Objective:    Vital Signs:  BP (!) 158/91   Pulse 74   Ht 5\' 11"  (1.803 m)   Wt 260 lb (117.9 kg)   BMI 36.26 kg/m    VITAL SIGNS:  reviewed GEN:  no acute distress EYES:  sclerae anicteric, EOMI - Extraocular Movements Intact RESPIRATORY:  Normal respiratory effort NEURO:  alert and oriented x 3, no obvious focal deficit PSYCH:  normal affect  ASSESSMENT & PLAN:    Coronary artery disease involving native coronary artery of native heart without angina pectoris History of CABG in 2016.  He is not having angina.  Continue aspirin, statin.  Leg swelling  He seems to be describing venous insufficiency.  He does note increased salt in his diet recently.  I have asked him to limit his salt, keep his leg elevated and wear compression stockings if possible.  He will send me a picture of his leg through my chart.  As his blood pressure is uncontrolled.  I will place him on HCTZ 25 mg daily.  Obtain follow-up BMP in 1 week.    Essential hypertension As noted, I will place him on HCTZ.  Obtain a follow-up BMET 1 week.  He is no longer on metoprolol or lisinopril.  This was apparently stopped last  year.   If his blood pressure remains above target, consider resuming 1 of these drugs.  Follow-up 3 months.  Hyperlipidemia, unspecified hyperlipidemia type Continue high-dose statin.  His LDL was >70 last year.  Obtain follow-up lipids and LFTs.  If LDL remains above 70, consider changing to rosuvastatin.  Educated About Covid-19 Virus Infection The signs and symptoms of COVID-19 were discussed with the patient and how to seek care for testing (follow up with PCP or arrange E-visit).  The importance of social distancing was discussed today.  Time:   Today, I have spent 17 minutes with the patient with telehealth technology discussing the above problems.     Medication Adjustments/Labs and Tests Ordered: Current medicines are reviewed at length with the patient today.  Concerns regarding medicines are outlined above.   Tests Ordered: Orders Placed This Encounter  Procedures  . Basic metabolic panel  . Hepatic function panel  . Lipid panel    Medication Changes: Meds ordered this encounter  Medications  . hydrochlorothiazide (HYDRODIURIL) 25 MG tablet    Sig: Take 1 tablet (25 mg total) by mouth daily.    Dispense:  30 tablet    Refill:  11    Order Specific Question:   Supervising Provider    Answer:   Lelon Perla [[id number]]    Disposition:  Follow up in 3 month(s)  Signed, Richardson Dopp, PA-C  05/20/2018 4:13 PM    [facility]

## 2018-05-20 ENCOUNTER — Encounter: Payer: Self-pay | Admitting: Physician Assistant

## 2018-05-20 ENCOUNTER — Telehealth (INDEPENDENT_AMBULATORY_CARE_PROVIDER_SITE_OTHER): Payer: Medicare PPO | Admitting: Physician Assistant

## 2018-05-20 VITALS — BP 158/91 | HR 74 | Ht 71.0 in | Wt 260.0 lb

## 2018-05-20 DIAGNOSIS — M7989 Other specified soft tissue disorders: Secondary | ICD-10-CM

## 2018-05-20 DIAGNOSIS — I2510 Atherosclerotic heart disease of native coronary artery without angina pectoris: Secondary | ICD-10-CM

## 2018-05-20 DIAGNOSIS — I10 Essential (primary) hypertension: Secondary | ICD-10-CM | POA: Diagnosis not present

## 2018-05-20 DIAGNOSIS — E785 Hyperlipidemia, unspecified: Secondary | ICD-10-CM | POA: Diagnosis not present

## 2018-05-20 DIAGNOSIS — Z7189 Other specified counseling: Secondary | ICD-10-CM | POA: Diagnosis not present

## 2018-05-20 MED ORDER — HYDROCHLOROTHIAZIDE 25 MG PO TABS: 25.0000 mg | ORAL_TABLET | Freq: Every day | ORAL | 11 refills | Status: DC

## 2018-05-20 NOTE — Patient Instructions (Signed)
Medication Instructions:  Start hydrochlorothiazide (HCTZ) 25 mg daily for blood pressure and swelling  If you need a refill on your cardiac medications before your next appointment, please call your pharmacy.   Lab work: Our office will arrange labs in about 1 week-BMET, LFTs, fasting lipids  If you have labs (blood work) drawn today and your tests are completely normal, you will receive your results only by: [name] Kitchen MyChart Message (if you have MyChart) OR . A paper copy in the mail If you have any lab test that is abnormal or we need to change your treatment, we will call you to review the results.  Testing/Procedures: None  Follow-Up: At [facility], you and your health needs are our priority.  As part of our continuing mission to provide you with exceptional heart care, we have created designated Provider Care Teams.  These Care Teams include your primary Cardiologist (physician) and Advanced Practice Providers (APPs -  Physician Assistants and Nurse Practitioners) who all work together to provide you with the care you need, when you need it. You will need a follow up appointment in:  3 months.  Please call our office 2 months in advance to schedule this appointment.  You may see [name], MD or Richardson Dopp, PA-C   Any Other Special Instructions Will Be Listed Below (If Applicable).  Check your blood pressure 3-4 times a week and send me some blood pressure readings in about 2 to 3 weeks. Call if her swelling does not improve or gets worse.

## 2018-05-21 ENCOUNTER — Other Ambulatory Visit: Payer: Self-pay | Admitting: Physician Assistant

## 2018-05-21 ENCOUNTER — Telehealth: Payer: Self-pay | Admitting: Cardiovascular Disease

## 2018-05-21 MED ORDER — OMEPRAZOLE 20 MG PO CPDR: 20.0000 mg | DELAYED_RELEASE_CAPSULE | Freq: Every day | ORAL | 3 refills | Status: DC

## 2018-05-21 MED ORDER — AMLODIPINE BESYLATE 10 MG PO TABS: 10.0000 mg | ORAL_TABLET | Freq: Every day | ORAL | 3 refills | Status: DC

## 2018-05-21 NOTE — Telephone Encounter (Signed)
Pt's medications were sent to pt's pharmacy as requested. Confirmation received.  

## 2018-05-21 NOTE — Telephone Encounter (Signed)
*  STAT* If patient is at the pharmacy, call can be transferred to refill team.   1. Which medications need to be refilled? (please list name of each medication and dose if known)  amLODipine (NORVASC) 10 MG tablet omeprazole (PRILOSEC) 20 MG capsule   2. Which pharmacy/location (including street and city if local pharmacy) is medication to be sent to?  [facility]  3. Do they need a 30 day or 90 day supply? 90 days

## 2018-05-27 ENCOUNTER — Telehealth: Payer: Self-pay | Admitting: Physician Assistant

## 2018-05-27 NOTE — Telephone Encounter (Signed)
I saw this patient last week and requested labs in the office this week. He does not have an appointment. Please schedule lab visit this week for BMET, LFTs, Fasting Lipids. Orders are already in [place]. Richardson Dopp, PA-C    05/27/2018 10:06 AM

## 2018-05-27 NOTE — Telephone Encounter (Signed)
Spoke with pt and he is agreeable to come to the office tomorrow for fasting labs.

## 2018-05-28 ENCOUNTER — Other Ambulatory Visit: Payer: Self-pay

## 2018-05-28 ENCOUNTER — Other Ambulatory Visit: Payer: Medicare PPO

## 2018-05-28 DIAGNOSIS — E785 Hyperlipidemia, unspecified: Secondary | ICD-10-CM | POA: Diagnosis not present

## 2018-05-28 DIAGNOSIS — M7989 Other specified soft tissue disorders: Secondary | ICD-10-CM | POA: Diagnosis not present

## 2018-05-28 DIAGNOSIS — I10 Essential (primary) hypertension: Secondary | ICD-10-CM | POA: Diagnosis not present

## 2018-05-28 DIAGNOSIS — I2510 Atherosclerotic heart disease of native coronary artery without angina pectoris: Secondary | ICD-10-CM | POA: Diagnosis not present

## 2018-05-29 ENCOUNTER — Telehealth: Payer: Self-pay | Admitting: *Deleted

## 2018-05-29 DIAGNOSIS — I2510 Atherosclerotic heart disease of native coronary artery without angina pectoris: Secondary | ICD-10-CM

## 2018-05-29 LAB — HEPATIC FUNCTION PANEL
ALT: 15 IU/L (ref 0–44)
AST: 21 IU/L (ref 0–40)
Albumin: 4.4 g/dL (ref 3.8–4.8)
Alkaline Phosphatase: 127 IU/L — ABNORMAL HIGH (ref 39–117)
Bilirubin Total: 0.7 mg/dL (ref 0.0–1.2)
Bilirubin, Direct: 0.25 mg/dL (ref 0.00–0.40)
Total Protein: 6.9 g/dL (ref 6.0–8.5)

## 2018-05-29 LAB — LIPID PANEL
Chol/HDL Ratio: 3.2 ratio (ref 0.0–5.0)
Cholesterol, Total: 165 mg/dL (ref 100–199)
HDL: 51 mg/dL (ref >39)
LDL Calculated: 95 mg/dL (ref 0–99)
Triglycerides: 97 mg/dL (ref 0–149)
VLDL Cholesterol Cal: 19 mg/dL (ref 5–40)

## 2018-05-29 LAB — BASIC METABOLIC PANEL
BUN/Creatinine Ratio: 15 (ref 10–24)
BUN: 22 mg/dL (ref 8–27)
CO2: 23 mmol/L (ref 20–29)
Calcium: 9.6 mg/dL (ref 8.6–10.2)
Chloride: 101 mmol/L (ref 96–106)
Creatinine, Ser: 1.49 mg/dL — ABNORMAL HIGH (ref 0.76–1.27)
GFR calc Af Amer: 54 mL/min/{1.73_m2} — ABNORMAL LOW (ref >59)
GFR calc non Af Amer: 47 mL/min/{1.73_m2} — ABNORMAL LOW (ref >59)
Glucose: 92 mg/dL (ref 65–99)
Potassium: 4.8 mmol/L (ref 3.5–5.2)
Sodium: 139 mmol/L (ref 134–144)

## 2018-05-29 MED ORDER — HYDROCHLOROTHIAZIDE 25 MG PO TABS: 12.5000 mg | ORAL_TABLET | Freq: Every day | ORAL | 1 refills | Status: DC

## 2018-05-29 MED ORDER — ROSUVASTATIN CALCIUM 40 MG PO TABS: 40.0000 mg | ORAL_TABLET | Freq: Every day | ORAL | 1 refills | Status: DC

## 2018-05-29 NOTE — Telephone Encounter (Signed)
SPOKE WITH PT AND PT AWARE OF RESULTS   MEDICATIONS CHANGES FROM LIPITOR TO CRESTOR  START TAKING HALF A TABLET OF HCTZ 12.5 DAILY PT TO CALL  BACK WITH BP READINGS IN 2 WEEKS  BMET ONE WEEK FASTING LABS THREE MONTHS SCHEDULED   LABS ROUTED TO PCP

## 2018-05-31 ENCOUNTER — Encounter: Payer: Self-pay | Admitting: Gastroenterology

## 2018-06-07 ENCOUNTER — Other Ambulatory Visit: Payer: Self-pay

## 2018-06-07 ENCOUNTER — Other Ambulatory Visit: Payer: Medicare PPO | Admitting: *Deleted

## 2018-06-07 DIAGNOSIS — I2510 Atherosclerotic heart disease of native coronary artery without angina pectoris: Secondary | ICD-10-CM

## 2018-06-07 LAB — BASIC METABOLIC PANEL
BUN/Creatinine Ratio: 13 (ref 10–24)
BUN: 15 mg/dL (ref 8–27)
CO2: 23 mmol/L (ref 20–29)
Calcium: 9.5 mg/dL (ref 8.6–10.2)
Chloride: 103 mmol/L (ref 96–106)
Creatinine, Ser: 1.2 mg/dL (ref 0.76–1.27)
GFR calc Af Amer: 70 mL/min/{1.73_m2} (ref >59)
GFR calc non Af Amer: 61 mL/min/{1.73_m2} (ref >59)
Glucose: 107 mg/dL — ABNORMAL HIGH (ref 65–99)
Potassium: 4.2 mmol/L (ref 3.5–5.2)
Sodium: 141 mmol/L (ref 134–144)

## 2018-06-10 ENCOUNTER — Other Ambulatory Visit: Payer: Self-pay

## 2018-06-12 ENCOUNTER — Ambulatory Visit: Payer: Medicare PPO | Admitting: Endocrinology

## 2018-06-12 ENCOUNTER — Encounter: Payer: Self-pay | Admitting: Endocrinology

## 2018-06-12 ENCOUNTER — Other Ambulatory Visit: Payer: Self-pay

## 2018-06-12 VITALS — BP 178/98 | HR 83 | Temp 97.8°F | Wt 252.0 lb

## 2018-06-12 DIAGNOSIS — R0600 Dyspnea, unspecified: Secondary | ICD-10-CM | POA: Insufficient documentation

## 2018-06-12 DIAGNOSIS — E1159 Type 2 diabetes mellitus with other circulatory complications: Secondary | ICD-10-CM

## 2018-06-12 LAB — POCT GLYCOSYLATED HEMOGLOBIN (HGB A1C): Hemoglobin A1C: 5.7 % — AB (ref 4.0–5.6)

## 2018-06-12 LAB — BRAIN NATRIURETIC PEPTIDE: Pro B Natriuretic peptide (BNP): 26 pg/mL (ref 0.0–100.0)

## 2018-06-12 MED ORDER — LOSARTAN POTASSIUM 50 MG PO TABS: 50.0000 mg | ORAL_TABLET | Freq: Every day | ORAL | 3 refills | Status: DC

## 2018-06-12 NOTE — Patient Instructions (Addendum)
Blood tests are requested for you today.  We'll let you know about the results.  Based on the results, i'll prescribe for you an additional blood pressure pill.   Please see a new primary care provider soon, to have the blood pressure rechecked.  Here is a list of primary care providers.          Heart-Healthy Eating Plan Heart-healthy meal planning includes:  Eating less unhealthy fats.  Eating more healthy fats.  Making other changes in your diet. Talk with your doctor or a diet specialist (dietitian) to create an eating plan that is right for you. What is my plan? Your doctor may recommend an eating plan that includes:  Total fat: ______% or less of total calories a day.  Saturated fat: ______% or less of total calories a day.  Cholesterol: less than _________mg a day. What are tips for following this plan? Cooking Avoid frying your food. Try to bake, boil, grill, or broil it instead. You can also reduce fat by:  Removing the skin from poultry.  Removing all visible fats from meats.  Steaming vegetables in water or broth. Meal planning   At meals, divide your plate into four equal parts: ? Fill one-half of your plate with vegetables and green salads. ? Fill one-fourth of your plate with whole grains. ? Fill one-fourth of your plate with lean protein foods.  Eat 4-5 servings of vegetables per day. A serving of vegetables is: ? 1 cup of raw or cooked vegetables. ? 2 cups of raw leafy greens.  Eat 4-5 servings of fruit per day. A serving of fruit is: ? 1 medium whole fruit. ?  cup of dried fruit. ?  cup of fresh, frozen, or canned fruit. ?  cup of 100% fruit juice.  Eat more foods that have soluble fiber. These are apples, broccoli, carrots, beans, peas, and barley. Try to get 20-30 g of fiber per day.  Eat 4-5 servings of nuts, legumes, and seeds per week: ? 1 serving of dried beans or legumes equals  cup after being cooked. ? 1 serving of nuts is   cup. ? 1 serving of seeds equals 1 tablespoon. General information  Eat more home-cooked food. Eat less restaurant, buffet, and fast food.  Limit or avoid alcohol.  Limit foods that are high in starch and sugar.  Avoid fried foods.  Lose weight if you are overweight.  Keep track of how much salt (sodium) you eat. This is important if you have high blood pressure. Ask your doctor to tell you more about this.  Try to add vegetarian meals each week. Fats  Choose healthy fats. These include olive oil and canola oil, flaxseeds, walnuts, almonds, and seeds.  Eat more omega-3 fats. These include salmon, mackerel, sardines, tuna, flaxseed oil, and ground flaxseeds. Try to eat fish at least 2 times each week.  Check food labels. Avoid foods with trans fats or high amounts of saturated fat.  Limit saturated fats. ? These are often found in animal products, such as meats, butter, and cream. ? These are also found in plant foods, such as palm oil, palm kernel oil, and coconut oil.  Avoid foods with partially hydrogenated oils in them. These have trans fats. Examples are stick margarine, some tub margarines, cookies, crackers, and other baked goods. What foods can I eat? Fruits All fresh, canned (in natural juice), or frozen fruits. Vegetables Fresh or frozen vegetables (raw, steamed, roasted, or grilled). Green salads. Grains Most grains.  Choose whole wheat and whole grains most of the time. Rice and pasta, including brown rice and pastas made with whole wheat. Meats and other proteins Lean, well-trimmed beef, veal, pork, and lamb. Chicken and [place] without skin. All fish and shellfish. Wild duck, rabbit, pheasant, and venison. Egg whites or low-cholesterol egg substitutes. Dried beans, peas, lentils, and tofu. Seeds and most nuts. Dairy Low-fat or nonfat cheeses, including ricotta and mozzarella. Skim or 1% milk that is liquid, powdered, or evaporated. Buttermilk that is made with  low-fat milk. Nonfat or low-fat yogurt. Fats and oils Non-hydrogenated (trans-free) margarines. Vegetable oils, including soybean, sesame, sunflower, olive, peanut, safflower, corn, canola, and cottonseed. Salad dressings or mayonnaise made with a vegetable oil. Beverages Mineral water. Coffee and tea. Diet carbonated beverages. Sweets and desserts Sherbet, gelatin, and fruit ice. Small amounts of dark chocolate. Limit all sweets and desserts. Seasonings and condiments All seasonings and condiments. The items listed above may not be a complete list of foods and drinks you can eat. Contact a dietitian for more options. What foods should I avoid? Fruits Canned fruit in heavy syrup. Fruit in cream or butter sauce. Fried fruit. Limit coconut. Vegetables Vegetables cooked in cheese, cream, or butter sauce. Fried vegetables. Grains Breads that are made with saturated or trans fats, oils, or whole milk. Croissants. Sweet rolls. Donuts. High-fat crackers, such as cheese crackers. Meats and other proteins Fatty meats, such as hot dogs, ribs, sausage, bacon, rib-eye roast or steak. High-fat deli meats, such as salami and bologna. Caviar. Domestic duck and goose. Organ meats, such as liver. Dairy Cream, sour cream, cream cheese, and creamed cottage cheese. Whole-milk cheeses. Whole or 2% milk that is liquid, evaporated, or condensed. Whole buttermilk. Cream sauce or high-fat cheese sauce. Yogurt that is made from whole milk. Fats and oils Meat fat, or shortening. Cocoa butter, hydrogenated oils, palm oil, coconut oil, palm kernel oil. Solid fats and shortenings, including bacon fat, salt pork, lard, and butter. Nondairy cream substitutes. Salad dressings with cheese or sour cream. Beverages Regular sodas and juice drinks with added sugar. Sweets and desserts Frosting. Pudding. Cookies. Cakes. Pies. Milk chocolate or white chocolate. Buttered syrups. Full-fat ice cream or ice cream drinks. The items  listed above may not be a complete list of foods and drinks to avoid. Contact a dietitian for more information. Summary  Heart-healthy meal planning includes eating less unhealthy fats, eating more healthy fats, and making other changes in your diet.  Eat a balanced diet. This includes fruits and vegetables, low-fat or nonfat dairy, lean protein, nuts and legumes, whole grains, and heart-healthy oils and fats. This information is not intended to replace advice given to you by your health care provider. Make sure you discuss any questions you have with your health care provider. Document Released: 06/27/2011 Document Revised: 02/02/2017 Document Reviewed: 02/02/2017 Elsevier Interactive Patient Education  2019 [employer].

## 2018-06-12 NOTE — Progress Notes (Signed)
Subjective:    Patient ID: Shane [name], male    DOB: [birth year], 68 y.o.   MRN: [id number]  HPI Pt returns for f/u of hyperglycemia.  pt states he feels well in general.  He takes HCTZ as rx'ed Past Medical History:  Diagnosis Date  . Asthma    PMH:as a child  . Carotid stenosis    a. Carotid US 3/16:  bilat ICA 1-39% // b. Carotid US 6/18: Stable bilateral 1-39 ICA  . CIGARETTE SMOKER 11/30/2009   Qualifier: Diagnosis of  By: Loanne Drilling MD, Jacelyn Pi   . Colon polyps 06/17/2011  . Coronary artery disease    a. Myoview 3/16:  Inf ischemia, EF 48%, int risk;  b.  LHC 3/16:  3 v CAD,  c.  s/p CABG x 4 03/2014  . DIABETES MELLITUS, TYPE II 09/25/2006   pt denies  . Diverticulosis 06/17/2011  . GERD (gastroesophageal reflux disease)   . Hx of cardiovascular stress test    Lexiscan Myoview 3/16:  Inferior ischemia, EF 48%, Intermediate Risk  . Hx of echocardiogram    a.  Echo 3/16:  EF 50-55%, Gr 1 DD  . HYPERCHOLESTEROLEMIA   . HYPERTENSION   . INSOMNIA 09/10/2008  . PSA, INCREASED 11/30/2009  . RENAL INSUFFICIENCY 09/25/2006  . Tinnitus   . Wears glasses     Past Surgical History:  Procedure Laterality Date  . CATARACT EXTRACTION W/ INTRAOCULAR LENS  IMPLANT, BILATERAL    . COLONOSCOPY  06/02/2015  . CORONARY ARTERY BYPASS GRAFT N/A 03/23/2014   Procedure: CORONARY ARTERY BYPASS GRAFTING (CABG), ON PUMP, TIMES FOUR, USING LEFT INTERNAL MAMMARY ARTERY, BILATERAL GREATER SAPHENOUS VEINS HARVESTED ENDOSCOPICALLY;  Surgeon: Ivin Poot, MD;  Location: [facility];  Service: Open Heart Surgery;  Laterality: N/A;  . HERNIA REPAIR    . LEFT HEART CATHETERIZATION WITH CORONARY ANGIOGRAM N/A 03/18/2014   Procedure: LEFT HEART CATHETERIZATION WITH CORONARY ANGIOGRAM;  Surgeon: [name], MD;  Location: [facility] CATH LAB;  Service: Cardiovascular;  Laterality: N/A;  . POLYPECTOMY    . TEE WITHOUT CARDIOVERSION N/A 03/23/2014   Procedure: TRANSESOPHAGEAL ECHOCARDIOGRAM (TEE);  Surgeon: Ivin Poot,  MD;  Location: [facility];  Service: Open Heart Surgery;  Laterality: N/A;  . TONSILLECTOMY      Social History   Socioeconomic History  . Marital status: Legally Separated    Spouse name: Not on file  . Number of children: Not on file  . Years of education: Not on file  . Highest education level: Not on file  Occupational History  . 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 Financial resource strain: Not on file  . Food insecurity:    Worry: Not on file    Inability: Not on file  . Transportation needs:    Medical: Not on file    Non-medical: Not on file  Tobacco Use  . Smoking status: Former Smoker    Packs/day: 2.00    Years: 50.00    Pack years: 100.00    Types: Cigarettes    Last attempt to quit: 03/17/2014    Years since quitting: 4.2  . Smokeless tobacco: Never Used  . Tobacco comment: none  Substance and Sexual Activity  . Alcohol use: Yes    Alcohol/week: 21.0 standard drinks    Types: 21 Cans of beer per week  . Drug use: No  . Sexual activity: Not on file  Lifestyle  . Physical activity:    Days per week: Not on  file    Minutes per session: Not on file  . Stress: Not on file  Relationships  . Social connections:    Talks on phone: Not on file    Gets together: Not on file    Attends religious service: Not on file    Active member of club or organization: Not on file    Attends meetings of clubs or organizations: Not on file    Relationship status: Not on file  . Intimate partner violence:    Fear of current or ex partner: Not on file    Emotionally abused: Not on file    Physically abused: Not on file    Forced sexual activity: Not on file  Other Topics Concern  . 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 aspirin 81 MG tablet Take 1 tablet (81 mg total) by mouth daily.    [name] Kitchen BIOTIN 5000 PO Take 5,000 mg by mouth  daily.     . Garlic 3557 MG CAPS Take 1,000 mg by mouth.     . Multiple Vitamins-Minerals (MULTIVITAMIN WITH MINERALS) tablet Take 1 tablet by mouth daily.    [name] Kitchen omeprazole (PRILOSEC) 20 MG capsule Take 1 capsule (20 mg total) by mouth daily. 90 capsule 3  . rosuvastatin (CRESTOR) 40 MG tablet Take 1 tablet (40 mg total) by mouth daily. 90 tablet 1   No current facility-administered medications on file prior to visit.     Allergies  Allergen Reactions  . Coreg [Carvedilol] Other (See Comments)    Did not feel well while taking    Family History  Problem Relation Age of Onset  . Cancer Father   . Hypertension Father   . Heart disease Mother   . Heart disease Sister   . Kidney failure Sister   . Colon cancer Sister 25  . Arrhythmia Sister   . Heart attack Maternal Grandmother   . Stroke Neg Hx   . Esophageal cancer Neg Hx   . Stomach cancer Neg Hx   . Colon polyps Neg Hx     BP (!) 178/98 (BP Location: Right Arm, Patient Position: Sitting, Cuff Size: Normal)   Pulse 83   Temp 97.8 F (36.6 C) (Oral)   Wt 252 lb (114.3 kg)   SpO2 95%   BMI 35.15 kg/m   Review of Systems No change in chronic doe.      Objective:   Physical Exam VITAL SIGNS:  See vs page GENERAL: no distress LUNGS:  Clear to auscultation.  Pulses: dorsalis pedis intact bilat.   MSK: no deformity of the feet CV: 1+ bilat leg edema.   Skin:  no ulcer on the feet.  normal color and temp on the feet. Neuro: sensation is intact to touch on the feet Ext: There is bilateral onychomycosis of the toenails.     Lab Results  Component Value Date   HGBA1C 5.5 06/11/2017   Lab Results  Component Value Date   CREATININE 1.20 06/07/2018   BUN 15 06/07/2018   NA 141 06/07/2018   K 4.2 06/07/2018   CL 103 06/07/2018   CO2 23 06/07/2018       Assessment & Plan:  HTN: he needs increased rx.   Edema: ? Localized vs fluid overload.   Hyperglycemia: I advised diet  Patient Instructions  Blood tests  are  requested for you today.  We'll let you know about the results.  Based on the results, i'll prescribe for you an additional blood pressure pill.   Please see a new primary care provider soon, to have the blood pressure rechecked.  Here is a list of primary care providers.          Heart-Healthy Eating Plan Heart-healthy meal planning includes:  Eating less unhealthy fats.  Eating more healthy fats.  Making other changes in your diet. Talk with your doctor or a diet specialist (dietitian) to create an eating plan that is right for you. What is my plan? Your doctor may recommend an eating plan that includes:  Total fat: ______% or less of total calories a day.  Saturated fat: ______% or less of total calories a day.  Cholesterol: less than _________mg a day. What are tips for following this plan? Cooking Avoid frying your food. Try to bake, boil, grill, or broil it instead. You can also reduce fat by:  Removing the skin from poultry.  Removing all visible fats from meats.  Steaming vegetables in water or broth. Meal planning   At meals, divide your plate into four equal parts: ? Fill one-half of your plate with vegetables and green salads. ? Fill one-fourth of your plate with whole grains. ? Fill one-fourth of your plate with lean protein foods.  Eat 4-5 servings of vegetables per day. A serving of vegetables is: ? 1 cup of raw or cooked vegetables. ? 2 cups of raw leafy greens.  Eat 4-5 servings of fruit per day. A serving of fruit is: ? 1 medium whole fruit. ?  cup of dried fruit. ?  cup of fresh, frozen, or canned fruit. ?  cup of 100% fruit juice.  Eat more foods that have soluble fiber. These are apples, broccoli, carrots, beans, peas, and barley. Try to get 20-30 g of fiber per day.  Eat 4-5 servings of nuts, legumes, and seeds per week: ? 1 serving of dried beans or legumes equals  cup after being cooked. ? 1 serving of nuts is  cup. ? 1  serving of seeds equals 1 tablespoon. General information  Eat more home-cooked food. Eat less restaurant, buffet, and fast food.  Limit or avoid alcohol.  Limit foods that are high in starch and sugar.  Avoid fried foods.  Lose weight if you are overweight.  Keep track of how much salt (sodium) you eat. This is important if you have high blood pressure. Ask your doctor to tell you more about this.  Try to add vegetarian meals each week. Fats  Choose healthy fats. These include olive oil and canola oil, flaxseeds, walnuts, almonds, and seeds.  Eat more omega-3 fats. These include salmon, mackerel, sardines, tuna, flaxseed oil, and ground flaxseeds. Try to eat fish at least 2 times each week.  Check food labels. Avoid foods with trans fats or high amounts of saturated fat.  Limit saturated fats. ? These are often found in animal products, such as meats, butter, and cream. ? These are also found in plant foods, such as palm oil, palm kernel oil, and coconut oil.  Avoid foods with partially hydrogenated oils in them. These have trans fats. Examples are stick margarine, some tub margarines, cookies, crackers, and other baked goods. What foods can I eat? Fruits All fresh, canned (in natural juice), or frozen fruits. Vegetables Fresh or frozen vegetables (raw, steamed, roasted, or grilled). Green salads. Grains Most grains. Choose whole wheat  and whole grains most of the time. Rice and pasta, including brown rice and pastas made with whole wheat. Meats and other proteins Lean, well-trimmed beef, veal, pork, and lamb. Chicken and [place] without skin. All fish and shellfish. Wild duck, rabbit, pheasant, and venison. Egg whites or low-cholesterol egg substitutes. Dried beans, peas, lentils, and tofu. Seeds and most nuts. Dairy Low-fat or nonfat cheeses, including ricotta and mozzarella. Skim or 1% milk that is liquid, powdered, or evaporated. Buttermilk that is made with low-fat milk.  Nonfat or low-fat yogurt. Fats and oils Non-hydrogenated (trans-free) margarines. Vegetable oils, including soybean, sesame, sunflower, olive, peanut, safflower, corn, canola, and cottonseed. Salad dressings or mayonnaise made with a vegetable oil. Beverages Mineral water. Coffee and tea. Diet carbonated beverages. Sweets and desserts Sherbet, gelatin, and fruit ice. Small amounts of dark chocolate. Limit all sweets and desserts. Seasonings and condiments All seasonings and condiments. The items listed above may not be a complete list of foods and drinks you can eat. Contact a dietitian for more options. What foods should I avoid? Fruits Canned fruit in heavy syrup. Fruit in cream or butter sauce. Fried fruit. Limit coconut. Vegetables Vegetables cooked in cheese, cream, or butter sauce. Fried vegetables. Grains Breads that are made with saturated or trans fats, oils, or whole milk. Croissants. Sweet rolls. Donuts. High-fat crackers, such as cheese crackers. Meats and other proteins Fatty meats, such as hot dogs, ribs, sausage, bacon, rib-eye roast or steak. High-fat deli meats, such as salami and bologna. Caviar. Domestic duck and goose. Organ meats, such as liver. Dairy Cream, sour cream, cream cheese, and creamed cottage cheese. Whole-milk cheeses. Whole or 2% milk that is liquid, evaporated, or condensed. Whole buttermilk. Cream sauce or high-fat cheese sauce. Yogurt that is made from whole milk. Fats and oils Meat fat, or shortening. Cocoa butter, hydrogenated oils, palm oil, coconut oil, palm kernel oil. Solid fats and shortenings, including bacon fat, salt pork, lard, and butter. Nondairy cream substitutes. Salad dressings with cheese or sour cream. Beverages Regular sodas and juice drinks with added sugar. Sweets and desserts Frosting. Pudding. Cookies. Cakes. Pies. Milk chocolate or white chocolate. Buttered syrups. Full-fat ice cream or ice cream drinks. The items listed above  may not be a complete list of foods and drinks to avoid. Contact a dietitian for more information. Summary  Heart-healthy meal planning includes eating less unhealthy fats, eating more healthy fats, and making other changes in your diet.  Eat a balanced diet. This includes fruits and vegetables, low-fat or nonfat dairy, lean protein, nuts and legumes, whole grains, and heart-healthy oils and fats. This information is not intended to replace advice given to you by your health care provider. Make sure you discuss any questions you have with your health care provider. Document Released: 06/27/2011 Document Revised: 02/02/2017 Document Reviewed: 02/02/2017 Elsevier Interactive Patient Education  2019 [employer].

## 2018-06-14 ENCOUNTER — Other Ambulatory Visit: Payer: Self-pay

## 2018-06-14 ENCOUNTER — Ambulatory Visit (AMBULATORY_SURGERY_CENTER): Payer: Self-pay | Admitting: *Deleted

## 2018-06-14 VITALS — Temp 98.3°F | Ht 71.0 in | Wt 257.0 lb

## 2018-06-14 DIAGNOSIS — Z86010 Personal history of colonic polyps: Secondary | ICD-10-CM

## 2018-06-14 DIAGNOSIS — Z800 Family history of malignant neoplasm of digestive organs: Secondary | ICD-10-CM

## 2018-06-14 MED ORDER — NA SULFATE-K SULFATE-MG SULF 17.5-3.13-1.6 GM/177ML PO SOLN: ORAL | 0 refills | Status: DC

## 2018-06-14 NOTE — Progress Notes (Signed)
Patient here for PV. Patient denies any allergies to eggs or soy. Patient denies any problems with anesthesia/sedation. Patient denies any oxygen use at home. Patient denies taking any diet/weight loss medications or blood thinners. EMMI education assisgned to patient on colonoscopy, this was explained and instructions given to patient. Patient will bring in medication list day of procedure-he was unsure of new cholesterol med.

## 2018-06-18 ENCOUNTER — Telehealth: Payer: Self-pay | Admitting: *Deleted

## 2018-06-18 NOTE — Telephone Encounter (Signed)

## 2018-06-20 ENCOUNTER — Ambulatory Visit (AMBULATORY_SURGERY_CENTER): Payer: Medicare PPO | Admitting: Gastroenterology

## 2018-06-20 ENCOUNTER — Other Ambulatory Visit: Payer: Self-pay

## 2018-06-20 ENCOUNTER — Encounter: Payer: Self-pay | Admitting: Gastroenterology

## 2018-06-20 VITALS — BP 127/64 | HR 76 | Temp 98.6°F | Resp 22 | Ht 71.0 in | Wt 252.0 lb

## 2018-06-20 DIAGNOSIS — D123 Benign neoplasm of transverse colon: Secondary | ICD-10-CM

## 2018-06-20 DIAGNOSIS — Z86010 Personal history of colonic polyps: Secondary | ICD-10-CM | POA: Diagnosis not present

## 2018-06-20 DIAGNOSIS — K219 Gastro-esophageal reflux disease without esophagitis: Secondary | ICD-10-CM | POA: Diagnosis not present

## 2018-06-20 DIAGNOSIS — I2510 Atherosclerotic heart disease of native coronary artery without angina pectoris: Secondary | ICD-10-CM | POA: Diagnosis not present

## 2018-06-20 DIAGNOSIS — D124 Benign neoplasm of descending colon: Secondary | ICD-10-CM

## 2018-06-20 DIAGNOSIS — I10 Essential (primary) hypertension: Secondary | ICD-10-CM | POA: Diagnosis not present

## 2018-06-20 DIAGNOSIS — D125 Benign neoplasm of sigmoid colon: Secondary | ICD-10-CM

## 2018-06-20 MED ORDER — SODIUM CHLORIDE 0.9 % IV SOLN: 500.0000 mL | Freq: Once | INTRAVENOUS | Status: DC

## 2018-06-20 NOTE — Progress Notes (Signed)
Report to PACU, RN, vss, BBS= Clear.  

## 2018-06-20 NOTE — Progress Notes (Signed)
VS taken by Rica Mote and [facility]  Pt's states no medical or surgical changes since previsit or office visit.

## 2018-06-20 NOTE — Patient Instructions (Signed)
Handouts given for polyps x7, diverticulosis, hemorrhoids, and high fiber diet.  YOU HAD AN ENDOSCOPIC PROCEDURE TODAY AT [facility]:   Refer to the procedure report that was given to you for any specific questions about what was found during the examination.  If the procedure report does not answer your questions, please call your gastroenterologist to clarify.  If you requested that your care partner not be given the details of your procedure findings, then the procedure report has been included in a sealed envelope for you to review at your convenience later.  YOU SHOULD EXPECT: Some feelings of bloating in the abdomen. Passage of more gas than usual.  Walking can help get rid of the air that was put into your GI tract during the procedure and reduce the bloating. If you had a lower endoscopy (such as a colonoscopy or flexible sigmoidoscopy) you may notice spotting of blood in your stool or on the toilet paper. If you underwent a bowel prep for your procedure, you may not have a normal bowel movement for a few days.  Please Note:  You might notice some irritation and congestion in your nose or some drainage.  This is from the oxygen used during your procedure.  There is no need for concern and it should clear up in a day or so.  SYMPTOMS TO REPORT IMMEDIATELY:   Following lower endoscopy (colonoscopy or flexible sigmoidoscopy):  Excessive amounts of blood in the stool  Significant tenderness or worsening of abdominal pains  Swelling of the abdomen that is new, acute  Fever of 100F or higher   For urgent or emergent issues, a gastroenterologist can be reached at any hour by calling [phone].   DIET:  We do recommend a small meal at first, but then you may proceed to your regular diet.  Drink plenty of fluids but you should avoid alcoholic beverages for 24 hours.  ACTIVITY:  You should plan to take it easy for the rest of today and you should NOT DRIVE or use heavy  machinery until tomorrow (because of the sedation medicines used during the test).    FOLLOW UP: Our staff will call the number listed on your records 48-72 hours following your procedure to check on you and address any questions or concerns that you may have regarding the information given to you following your procedure. If we do not reach you, we will leave a message.  We will attempt to reach you two times.  During this call, we will ask if you have developed any symptoms of COVID 19. If you develop any symptoms (ie: fever, flu-like symptoms, shortness of breath, cough etc.) before then, please call [phone].  If you test positive for Covid 19 in the 2 weeks post procedure, please call and report this information to [place].    If any biopsies were taken you will be contacted by phone or by letter within the next 1-3 weeks.  Please call us at [phone] if you have not heard about the biopsies in 3 weeks.    SIGNATURES/CONFIDENTIALITY: You and/or your care partner have signed paperwork which will be entered into your electronic medical record.  These signatures attest to the fact that that the information above on your After Visit Summary has been reviewed and is understood.  Full responsibility of the confidentiality of this discharge information lies with you and/or your care-partner.

## 2018-06-20 NOTE — Progress Notes (Signed)
Called to room to assist during endoscopic procedure.  Patient ID and intended procedure confirmed with present staff. Received instructions for my participation in the procedure from the performing physician.  

## 2018-06-20 NOTE — Op Note (Signed)
[facility] Patient Name: Shane [name] Procedure Date: 06/20/2018 7:54 AM MRN: [id number] Endoscopist: Ladene Artist , MD Age: 68 Referring MD:  Date of Birth: [birth year] Gender: Male Account #: [id number] Procedure:                Colonoscopy Indications:              Surveillance: Personal history of adenomatous                            polyps on last colonoscopy 3 years ago Medicines:                Monitored Anesthesia Care Procedure:                Pre-Anesthesia Assessment:                           - Prior to the procedure, a History and Physical                            was performed, and patient medications and                            allergies were reviewed. The patient's tolerance of                            previous anesthesia was also reviewed. The risks                            and benefits of the procedure and the sedation                            options and risks were discussed with the patient.                            All questions were answered, and informed consent                            was obtained. Prior Anticoagulants: The patient has                            taken no previous anticoagulant or antiplatelet                            agents. ASA Grade Assessment: III - A patient with                            severe systemic disease. After reviewing the risks                            and benefits, the patient was deemed in                            satisfactory condition to undergo the procedure.  After obtaining informed consent, the colonoscope                            was passed under direct vision. Throughout the                            procedure, the patient's blood pressure, pulse, and                            oxygen saturations were monitored continuously. The                            Colonoscope was introduced through the anus and                            advanced to the the cecum,  identified by                            appendiceal orifice and ileocecal valve. The                            ileocecal valve, appendiceal orifice, and rectum                            were photographed. The quality of the bowel                            preparation was good. The colonoscopy was performed                            without difficulty. The patient tolerated the                            procedure well. Scope In: 8:24:05 AM Scope Out: 8:41:51 AM Scope Withdrawal Time: 0 hours 16 minutes 3 seconds  Total Procedure Duration: 0 hours 17 minutes 46 seconds  Findings:                 The perianal and digital rectal examinations were                            normal.                           Seven sessile polyps were found in the sigmoid                            colon (1), descending colon (2) and transverse                            colon (4). The polyps were 4 to 8 mm in size. These                            polyps were removed with a cold snare. Resection  and retrieval were complete.                           Multiple medium-mouthed diverticula were found in                            the sigmoid colon, descending colon and transverse                            colon. There was no evidence of diverticular                            bleeding.                           Internal hemorrhoids were found during                            retroflexion. The hemorrhoids were small and Grade                            I (internal hemorrhoids that do not prolapse).                           The exam was otherwise without abnormality on                            direct and retroflexion views. Complications:            No immediate complications. Estimated blood loss:                            None. Estimated Blood Loss:     Estimated blood loss: none. Impression:               - Seven 4 to 8 mm polyps in the sigmoid colon, in                             the descending colon and in the transverse colon,                            removed with a cold snare. Resected and retrieved.                           - Moderate diverticulosis in the sigmoid colon, in                            the descending colon and in the transverse colon.                           - The examination was otherwise normal on direct                            and retroflexion views.                           -  Internal hemorrhoids. Recommendation:           - Repeat colonoscopy in 3 years for surveillance.                           - Patient has a contact number available for                            emergencies. The signs and symptoms of potential                            delayed complications were discussed with the                            patient. Return to normal activities tomorrow.                            Written discharge instructions were provided to the                            patient.                           - High fiber diet.                           - Continue present medications.                           - Await pathology results. Ladene Artist, MD 06/20/2018 8:47:08 AM This report has been signed electronically.

## 2018-06-24 ENCOUNTER — Telehealth: Payer: Self-pay

## 2018-06-24 NOTE — Telephone Encounter (Signed)
Busy signal on 2nd follow up call. °

## 2018-06-24 NOTE — Telephone Encounter (Signed)
Left message on follow up call. 

## 2018-07-09 ENCOUNTER — Encounter: Payer: Self-pay | Admitting: Gastroenterology

## 2018-07-19 ENCOUNTER — Other Ambulatory Visit: Payer: Self-pay | Admitting: *Deleted

## 2018-07-19 DIAGNOSIS — Z87891 Personal history of nicotine dependence: Secondary | ICD-10-CM

## 2018-07-19 DIAGNOSIS — Z122 Encounter for screening for malignant neoplasm of respiratory organs: Secondary | ICD-10-CM

## 2018-08-14 DIAGNOSIS — L738 Other specified follicular disorders: Secondary | ICD-10-CM | POA: Diagnosis not present

## 2018-08-21 ENCOUNTER — Telehealth: Payer: Self-pay | Admitting: Cardiovascular Disease

## 2018-08-21 DIAGNOSIS — M7989 Other specified soft tissue disorders: Secondary | ICD-10-CM

## 2018-08-21 DIAGNOSIS — R0602 Shortness of breath: Secondary | ICD-10-CM

## 2018-08-21 NOTE — Telephone Encounter (Signed)
Mr. Robinette reports on and off swelling in his R leg for 3 years. He complained of similar symptoms when he had an evisit a couple months ago, but things have gotten a little worse. He states when he is on his feet a lot, the R foot, ankle and calf swell "pretty bad." His shoe still fits but it is tight. When he lies down, the swelling reduces but it never seems to totally go away. If he sits too long, the leg goes numb. Moving around brings feeling back. He has not been doing a great job at limiting his salt- he says he "could be doing better." Reiterated to him to avoid canned, frozen and preserved foods. He denies redness or hotness to the leg. He states he does have a little shortness of breath when he moves too much, but he attributes that to gaining "a lot of weight." He is unsure of how much.  He is sending a picture of his leg via MyChart for Scott to review.  He understands he will be called with further recommendations.

## 2018-08-21 NOTE — Telephone Encounter (Signed)
Is he still taking Amlodipine? Last visit with Dr. Loanne Drilling reviewed.  Notes indicate he was to stop Amlodipine and start Losartan. PLAN:  1. If he is still taking Amlodipine reduce dose by 1/2. 2. Keep leg elevated, wear compression stockings. 3. Arrange 2D Echocardiogram (Dx - shortness of breath, leg swelling) 4. Will await picture via MyChart. Richardson Dopp, PA-C    08/21/2018 5:32 PM

## 2018-08-21 NOTE — Telephone Encounter (Signed)
New Message   Pt c/o medication issue:  1. Name of Medication: rosuvastatin (CRESTOR) 40 MG tablet  2. How are you currently taking this medication (dosage and times per day)? Take 1 tablet (40 mg total) by mouth daily.  3. Are you having a reaction (difficulty breathing--STAT)? No  4. What is your medication issue? Patient states that he may need to increase his dosage because he is still having some swelling in his ankle and calf if he stands too long. Patient also states that he has some numbness in his upper part of his right leg (feels like pins and needles), and is cold to the touch has to walk around to make it better. Has some slight swelling now, and he states it gets worse throughout the day, but when he goes to be the swelling goes down. Please advise.

## 2018-08-22 NOTE — Telephone Encounter (Signed)
Called the patient to follow-up. He forgot to send a picture of his leg and will do so this afternoon. He is not taking amlodipine or Losartan. Reviewed medications with the patient and he is taking HCTZ 25 mg daily. His med list has been updated. Reiterated to him the importance of limiting salt and instructed him to elevate legs while sitting and to wear his compression stockings during the day. Scheduled the patient for echo 8/20. He was grateful for call and agrees with treatment plan.

## 2018-08-29 ENCOUNTER — Ambulatory Visit (INDEPENDENT_AMBULATORY_CARE_PROVIDER_SITE_OTHER)
Admission: RE | Admit: 2018-08-29 | Discharge: 2018-08-29 | Disposition: A | Discharge status: Home / Self Care | Payer: Medicare PPO | Source: Ambulatory Visit | Attending: Acute Care | Admitting: Acute Care

## 2018-08-29 ENCOUNTER — Other Ambulatory Visit: Payer: Medicare PPO | Admitting: *Deleted

## 2018-08-29 ENCOUNTER — Other Ambulatory Visit: Payer: Self-pay

## 2018-08-29 ENCOUNTER — Ambulatory Visit (HOSPITAL_COMMUNITY): Payer: Medicare PPO | Attending: Cardiovascular Disease

## 2018-08-29 ENCOUNTER — Encounter: Payer: Self-pay | Admitting: Physician Assistant

## 2018-08-29 DIAGNOSIS — Z87891 Personal history of nicotine dependence: Secondary | ICD-10-CM

## 2018-08-29 DIAGNOSIS — M7989 Other specified soft tissue disorders: Secondary | ICD-10-CM | POA: Diagnosis not present

## 2018-08-29 DIAGNOSIS — I2510 Atherosclerotic heart disease of native coronary artery without angina pectoris: Secondary | ICD-10-CM

## 2018-08-29 DIAGNOSIS — R0602 Shortness of breath: Secondary | ICD-10-CM | POA: Diagnosis not present

## 2018-08-29 DIAGNOSIS — Z122 Encounter for screening for malignant neoplasm of respiratory organs: Secondary | ICD-10-CM | POA: Diagnosis not present

## 2018-08-29 LAB — LIPID PANEL
Chol/HDL Ratio: 3.4 ratio (ref 0.0–5.0)
Cholesterol, Total: 141 mg/dL (ref 100–199)
HDL: 42 mg/dL (ref >39)
LDL Calculated: 82 mg/dL (ref 0–99)
Triglycerides: 84 mg/dL (ref 0–149)
VLDL Cholesterol Cal: 17 mg/dL (ref 5–40)

## 2018-08-29 LAB — HEPATIC FUNCTION PANEL
ALT: 16 IU/L (ref 0–44)
AST: 24 IU/L (ref 0–40)
Albumin: 4.4 g/dL (ref 3.8–4.8)
Alkaline Phosphatase: 109 IU/L (ref 39–117)
Bilirubin Total: 0.3 mg/dL (ref 0.0–1.2)
Bilirubin, Direct: 0.11 mg/dL (ref 0.00–0.40)
Total Protein: 6.8 g/dL (ref 6.0–8.5)

## 2018-08-30 ENCOUNTER — Other Ambulatory Visit: Payer: Self-pay | Admitting: *Deleted

## 2018-08-30 ENCOUNTER — Telehealth: Payer: Self-pay

## 2018-08-30 DIAGNOSIS — Z79899 Other long term (current) drug therapy: Secondary | ICD-10-CM

## 2018-08-30 DIAGNOSIS — I2510 Atherosclerotic heart disease of native coronary artery without angina pectoris: Secondary | ICD-10-CM

## 2018-08-30 DIAGNOSIS — Z87891 Personal history of nicotine dependence: Secondary | ICD-10-CM

## 2018-08-30 DIAGNOSIS — Z122 Encounter for screening for malignant neoplasm of respiratory organs: Secondary | ICD-10-CM

## 2018-08-30 DIAGNOSIS — E785 Hyperlipidemia, unspecified: Secondary | ICD-10-CM

## 2018-08-30 MED ORDER — EZETIMIBE 10 MG PO TABS: 10.0000 mg | ORAL_TABLET | Freq: Every day | ORAL | 3 refills | Status: DC

## 2018-08-30 NOTE — Telephone Encounter (Signed)
LMTCB

## 2018-08-30 NOTE — Telephone Encounter (Signed)
Pt advised and agreed with plan to start Zetia. Will return 11/2018 for repeat fasting labs.

## 2018-08-30 NOTE — Telephone Encounter (Signed)
-----   Message from Liliane Shi, [place] sent at 08/30/2018  9:41 AM EDT ----- LDL slightly improved, but still above goal (<70).  LFTs normal. PLAN:   -Continue current dose of Crestor  -Add Zetia 10 mg daily  -Repeat fasting lipids and LFTs in 3 months Richardson Dopp, PA-C    08/30/2018 9:37 AM

## 2018-11-27 ENCOUNTER — Other Ambulatory Visit: Payer: Self-pay

## 2018-11-27 ENCOUNTER — Other Ambulatory Visit: Payer: Medicare PPO

## 2018-12-31 ENCOUNTER — Other Ambulatory Visit: Payer: Self-pay

## 2018-12-31 ENCOUNTER — Other Ambulatory Visit: Payer: Medicare PPO | Admitting: *Deleted

## 2018-12-31 DIAGNOSIS — E785 Hyperlipidemia, unspecified: Secondary | ICD-10-CM

## 2018-12-31 DIAGNOSIS — I2510 Atherosclerotic heart disease of native coronary artery without angina pectoris: Secondary | ICD-10-CM | POA: Diagnosis not present

## 2018-12-31 DIAGNOSIS — Z79899 Other long term (current) drug therapy: Secondary | ICD-10-CM

## 2019-01-01 ENCOUNTER — Telehealth: Payer: Self-pay

## 2019-01-01 DIAGNOSIS — E785 Hyperlipidemia, unspecified: Secondary | ICD-10-CM

## 2019-01-01 DIAGNOSIS — I2510 Atherosclerotic heart disease of native coronary artery without angina pectoris: Secondary | ICD-10-CM

## 2019-01-01 DIAGNOSIS — E7800 Pure hypercholesterolemia, unspecified: Secondary | ICD-10-CM

## 2019-01-01 LAB — HEPATIC FUNCTION PANEL
ALT: 18 IU/L (ref 0–44)
AST: 25 IU/L (ref 0–40)
Albumin: 4.1 g/dL (ref 3.8–4.8)
Alkaline Phosphatase: 116 IU/L (ref 39–117)
Bilirubin Total: 0.3 mg/dL (ref 0.0–1.2)
Bilirubin, Direct: 0.11 mg/dL (ref 0.00–0.40)
Total Protein: 6.4 g/dL (ref 6.0–8.5)

## 2019-01-01 LAB — LIPID PANEL
Chol/HDL Ratio: 5.5 ratio — ABNORMAL HIGH (ref 0.0–5.0)
Cholesterol, Total: 227 mg/dL — ABNORMAL HIGH (ref 100–199)
HDL: 41 mg/dL (ref >39)
LDL Chol Calc (NIH): 159 mg/dL — ABNORMAL HIGH (ref 0–99)
Triglycerides: 149 mg/dL (ref 0–149)
VLDL Cholesterol Cal: 27 mg/dL (ref 5–40)

## 2019-01-01 NOTE — Telephone Encounter (Signed)
If it only improved slightly, it was not likely related. Resume Rosuvastatin. Obtain fasting Lipids and LFTs in 3 mos. If he has to stop the Rosuvastatin again due to side effects, call so we can cancel labs and make alternative recommendations. Richardson Dopp, PA-C    01/01/2019 1:58 PM

## 2019-01-01 NOTE — Telephone Encounter (Signed)
I called and left patient a message to call back about lab results/recommendatios.

## 2019-01-01 NOTE — Telephone Encounter (Signed)
I called and spoke with patient, he was taking both Rosuvastatin and Zetia. He started having some swelling and burning in right lower leg. He also states that when he sits for too long his right leg will have numbness and burning in his thigh. He stopped the Rosuvastatin and the burning got a little bit better.

## 2019-01-08 NOTE — Telephone Encounter (Signed)
I called and left patient a message to call back about lab results/recommendations per [place].

## 2019-01-14 NOTE — Telephone Encounter (Signed)
I called and left patient another message to call back for recommendations. Also sent patient a mychart message with Scott's recommendations and advised patient to call or send me a mychart message.

## 2019-01-16 ENCOUNTER — Telehealth: Payer: Self-pay | Admitting: Physician Assistant

## 2019-01-16 NOTE — Telephone Encounter (Signed)
Mailed patient letter to call back about lab results.

## 2019-01-16 NOTE — Telephone Encounter (Signed)
Called and left patient a message to call back.

## 2019-01-16 NOTE — Telephone Encounter (Signed)
New Message    Pt is returning a call he got today    Please call back

## 2019-01-17 NOTE — Telephone Encounter (Signed)
I called and spoke with patient, he will resume Rosuvastatin and he is aware that if he has to stop Rosuvastatin again due to side effects he will call the office to discuss alternative. Patient will come back on 04/18/19 for fasting lipids and LFTs.

## 2019-02-11 DIAGNOSIS — R972 Elevated prostate specific antigen [PSA]: Secondary | ICD-10-CM | POA: Diagnosis not present

## 2019-02-18 DIAGNOSIS — N401 Enlarged prostate with lower urinary tract symptoms: Secondary | ICD-10-CM | POA: Diagnosis not present

## 2019-02-18 DIAGNOSIS — R972 Elevated prostate specific antigen [PSA]: Secondary | ICD-10-CM | POA: Diagnosis not present

## 2019-02-18 DIAGNOSIS — R3912 Poor urinary stream: Secondary | ICD-10-CM | POA: Diagnosis not present

## 2019-03-02 ENCOUNTER — Ambulatory Visit: Payer: Medicare PPO | Attending: Internal Medicine

## 2019-03-02 DIAGNOSIS — Z23 Encounter for immunization: Secondary | ICD-10-CM | POA: Insufficient documentation

## 2019-03-02 NOTE — Progress Notes (Signed)
   Covid-19 Vaccination Clinic  Name:  Shane [name]    MRN: [id number] DOB: [birth year]  03/02/2019  [name] was observed post Covid-19 immunization for 15 minutes without incidence. He was provided with Vaccine Information Sheet and instruction to access the V-Safe system.   [name] was instructed to call 911 with any severe reactions post vaccine: [name] Kitchen Difficulty breathing  . Swelling of your face and throat  . A fast heartbeat  . A bad rash all over your body  . Dizziness and weakness    Immunizations Administered    Name Date Dose VIS Date Route   Pfizer COVID-19 Vaccine 03/02/2019 11:20 AM 0.3 mL 12/20/2018 Intramuscular   Manufacturer: [place]   Lot: [id number]   [place]: [id number]

## 2019-03-26 ENCOUNTER — Ambulatory Visit: Payer: Medicare PPO | Attending: Internal Medicine

## 2019-03-26 DIAGNOSIS — Z23 Encounter for immunization: Secondary | ICD-10-CM

## 2019-03-26 NOTE — Progress Notes (Signed)
   Covid-19 Vaccination Clinic  Name:  Shane [name]    MRN: [id number] DOB: [birth year]  03/26/2019  [name] was observed post Covid-19 immunization for 15 minutes without incident. He was provided with Vaccine Information Sheet and instruction to access the V-Safe system.   [name] was instructed to call 911 with any severe reactions post vaccine: [name] Kitchen Difficulty breathing  . Swelling of face and throat  . A fast heartbeat  . A bad rash all over body  . Dizziness and weakness   Immunizations Administered    Name Date Dose VIS Date Route   Pfizer COVID-19 Vaccine 03/26/2019 10:12 AM 0.3 mL 12/20/2018 Intramuscular   Manufacturer: [place]   Lot: [id number]   [place]: [id number]

## 2019-03-31 DIAGNOSIS — R972 Elevated prostate specific antigen [PSA]: Secondary | ICD-10-CM | POA: Diagnosis not present

## 2019-04-18 ENCOUNTER — Other Ambulatory Visit: Payer: Self-pay

## 2019-04-18 ENCOUNTER — Other Ambulatory Visit: Payer: Medicare PPO | Admitting: *Deleted

## 2019-04-18 DIAGNOSIS — E785 Hyperlipidemia, unspecified: Secondary | ICD-10-CM | POA: Diagnosis not present

## 2019-04-18 DIAGNOSIS — E7800 Pure hypercholesterolemia, unspecified: Secondary | ICD-10-CM

## 2019-04-18 DIAGNOSIS — I2510 Atherosclerotic heart disease of native coronary artery without angina pectoris: Secondary | ICD-10-CM

## 2019-04-18 LAB — LIPID PANEL
Chol/HDL Ratio: 5.5 ratio — ABNORMAL HIGH (ref 0.0–5.0)
Cholesterol, Total: 233 mg/dL — ABNORMAL HIGH (ref 100–199)
HDL: 42 mg/dL (ref >39)
LDL Chol Calc (NIH): 170 mg/dL — ABNORMAL HIGH (ref 0–99)
Triglycerides: 118 mg/dL (ref 0–149)
VLDL Cholesterol Cal: 21 mg/dL (ref 5–40)

## 2019-04-18 LAB — HEPATIC FUNCTION PANEL
ALT: 15 IU/L (ref 0–44)
AST: 23 IU/L (ref 0–40)
Albumin: 4 g/dL (ref 3.7–4.7)
Alkaline Phosphatase: 135 IU/L — ABNORMAL HIGH (ref 39–117)
Bilirubin Total: 0.5 mg/dL (ref 0.0–1.2)
Bilirubin, Direct: 0.19 mg/dL (ref 0.00–0.40)
Total Protein: 6.6 g/dL (ref 6.0–8.5)

## 2019-04-22 ENCOUNTER — Telehealth: Payer: Self-pay

## 2019-04-22 DIAGNOSIS — E7800 Pure hypercholesterolemia, unspecified: Secondary | ICD-10-CM

## 2019-04-22 DIAGNOSIS — I2510 Atherosclerotic heart disease of native coronary artery without angina pectoris: Secondary | ICD-10-CM

## 2019-04-22 DIAGNOSIS — E785 Hyperlipidemia, unspecified: Secondary | ICD-10-CM

## 2019-04-22 NOTE — Telephone Encounter (Signed)
I called and spoke with patient, he is aware to contact [facility]

## 2019-04-22 NOTE — Telephone Encounter (Signed)
-----   Message from Liliane Shi, [place] sent at 04/21/2019  5:06 PM EDT ----- Regarding: RE: PCP [facility]. Any of the MDs there are good. ----- Message ----- From: Mady Haagensen, CMA Sent: 04/21/2019   4:35 PM EDT To: Liliane Shi, PA-C Subject: PCP                                            Patient does not have a PCP and would like to know who you would recommend, he does not know where to start.

## 2019-04-25 ENCOUNTER — Telehealth: Payer: Self-pay | Admitting: Physician Assistant

## 2019-04-25 NOTE — Telephone Encounter (Signed)
New Message    Pt is calling to schedule referral appt with [facility]     Please Call back

## 2019-05-06 ENCOUNTER — Telehealth (INDEPENDENT_AMBULATORY_CARE_PROVIDER_SITE_OTHER): Payer: Medicare PPO | Admitting: Internal Medicine

## 2019-05-06 ENCOUNTER — Encounter: Payer: Self-pay | Admitting: Internal Medicine

## 2019-05-06 VITALS — BP 185/97 | HR 92

## 2019-05-06 DIAGNOSIS — M791 Myalgia, unspecified site: Secondary | ICD-10-CM

## 2019-05-06 DIAGNOSIS — T466X5A Adverse effect of antihyperlipidemic and antiarteriosclerotic drugs, initial encounter: Secondary | ICD-10-CM | POA: Diagnosis not present

## 2019-05-06 DIAGNOSIS — E785 Hyperlipidemia, unspecified: Secondary | ICD-10-CM

## 2019-05-06 DIAGNOSIS — I6523 Occlusion and stenosis of bilateral carotid arteries: Secondary | ICD-10-CM

## 2019-05-06 DIAGNOSIS — I2510 Atherosclerotic heart disease of native coronary artery without angina pectoris: Secondary | ICD-10-CM | POA: Diagnosis not present

## 2019-05-06 MED ORDER — ATORVASTATIN CALCIUM 40 MG PO TABS: 40.0000 mg | ORAL_TABLET | Freq: Every day | ORAL | 3 refills | Status: DC

## 2019-05-06 NOTE — Progress Notes (Signed)
Virtual Visit via Video Note   This visit type was conducted due to national recommendations for restrictions regarding the COVID-19 Pandemic (e.g. social distancing) in an effort to limit this patient's exposure and mitigate transmission in our community.  Due to his co-morbid illnesses, this patient is at least at moderate risk for complications without adequate follow up.  This format is felt to be most appropriate for this patient at this time.  All issues noted in this document were discussed and addressed.  A limited physical exam was performed with this format.  Please refer to the patient's chart for his consent to telehealth for [name].   Evaluation Performed:  Caregility video visit  Date:  05/06/2019   ID:  Shane [name], DOB 1950-12-21, MRN [id number]  Patient Location:  [address] [place] [address]  Provider location:   [address], [place] [address] [place],  [address]  PCP:  Liliane Shi, [place]  Cardiologist:  [name], MD Electrophysiologist:  None   Chief Complaint:  Manage cholesterol  History of Present Illness:    Shane [name] is a 69 y.o. male who presents via audio/video conferencing for a telehealth visit today.  Mr. Verrill 69 year old male followed by Richardson Dopp.  He has a history of coronary artery disease and prior CABG x4 in 2016 as well as type 2 diabetes, dyslipidemia and carotid artery disease.  He has had longstanding dyslipidemia was previously better controlled on rosuvastatin 40 mg and ezetimibe 10 mg daily.  He was developing some symptoms of burning in his legs and discontinue the medication due to the thought it might be a myalgia.  He said that did improve somewhat, although his cholesterol accordingly increased.  His most recent labs 2 weeks ago showed total cholesterol 233, triglycerides 118, HDL 42 and LDL 170.  About a year ago his total cholesterol was 165.  He also reports of dietary changes and less activity as  result of Covid.  He continues to take the ezetimibe and says he restarted the rosuvastatin when he saw a high his numbers were despite the fact that he had some side effects.  The patient does not have symptoms concerning for COVID-19 infection (fever, chills, cough, or new SHORTNESS OF BREATH).    Prior CV studies:   The following studies were reviewed today:  Chart review, lab work  PMHx:  Past Medical History:  Diagnosis Date  . Asthma    PMH:as a child  . Carotid stenosis    a. Carotid US 3/16:  bilat ICA 1-39% // b. Carotid US 6/18: Stable bilateral 1-39 ICA  . CIGARETTE SMOKER 11/30/2009   Qualifier: Diagnosis of  By: Loanne Drilling MD, Jacelyn Pi   . Colon polyps 06/17/2011  . Coronary artery disease    a. Myoview 3/16:  Inf ischemia, EF 48%, int risk;  b.  LHC 3/16:  3 v CAD,  c.  s/p CABG x 4 03/2014  . DIABETES MELLITUS, TYPE II 09/25/2006   pt denies  . Diverticulosis 06/17/2011  . Echocardiograms    Echocardiogram 08/2018:  EF 55-60, mod conc LVH, Gr 1 DD, normal RVSF, mild calc of AoV  . GERD (gastroesophageal reflux disease)   . Hx of cardiovascular stress test    Lexiscan Myoview 3/16:  Inferior ischemia, EF 48%, Intermediate Risk  . Hx of echocardiogram    a.  Echo 3/16:  EF 50-55%, Gr 1 DD  . HYPERCHOLESTEROLEMIA   . HYPERTENSION   .  INSOMNIA 09/10/2008  . PSA, INCREASED 11/30/2009  . RENAL INSUFFICIENCY 09/25/2006  . Tinnitus   . Wears glasses     Past Surgical History:  Procedure Laterality Date  . CATARACT EXTRACTION W/ INTRAOCULAR LENS  IMPLANT, BILATERAL    . COLONOSCOPY  06/02/2015  . CORONARY ARTERY BYPASS GRAFT N/A 03/23/2014   Procedure: CORONARY ARTERY BYPASS GRAFTING (CABG), ON PUMP, TIMES FOUR, USING LEFT INTERNAL MAMMARY ARTERY, BILATERAL GREATER SAPHENOUS VEINS HARVESTED ENDOSCOPICALLY;  Surgeon: Ivin Poot, MD;  Location: [facility];  Service: Open Heart Surgery;  Laterality: N/A;  . HERNIA REPAIR    . LEFT HEART CATHETERIZATION WITH CORONARY ANGIOGRAM N/A  03/18/2014   Procedure: LEFT HEART CATHETERIZATION WITH CORONARY ANGIOGRAM;  Surgeon: [name], MD;  Location: [facility] CATH [facility];  Service: Cardiovascular;  Laterality: N/A;  . POLYPECTOMY    . TEE WITHOUT CARDIOVERSION N/A 03/23/2014   Procedure: TRANSESOPHAGEAL ECHOCARDIOGRAM (TEE);  Surgeon: Ivin Poot, MD;  Location: [facility];  Service: Open Heart Surgery;  Laterality: N/A;  . TONSILLECTOMY      FAMHx:  Family History  Problem Relation Age of Onset  . Cancer Father   . Hypertension Father   . Heart disease Mother   . Heart disease Sister   . Kidney failure Sister   . Colon cancer Sister 17  . Arrhythmia Sister   . Heart attack Maternal Grandmother   . Stroke Neg Hx   . Esophageal cancer Neg Hx   . Stomach cancer Neg Hx   . Colon polyps Neg Hx     SOCHx:   reports that he quit smoking about 5 years ago. His smoking use included cigarettes. He has a 100.00 pack-year smoking history. He has never used smokeless tobacco. He reports current alcohol use of about 21.0 standard drinks of alcohol per week. He reports that he does not use drugs.  ALLERGIES:  Allergies  Allergen Reactions  . Coreg [Carvedilol] Other (See Comments)    Did not feel well while taking  . Crestor [Rosuvastatin] Other (See Comments)    Muscle pains    MEDS:  Current Meds  Medication Sig  . aspirin 81 MG tablet Take 1 tablet (81 mg total) by mouth daily.  [name] Kitchen BIOTIN 5000 PO Take 5,000 mg by mouth daily.   [name] Kitchen ezetimibe (ZETIA) 10 MG tablet Take 1 tablet (10 mg total) by mouth daily.  . Garlic 123XX123 MG CAPS Take 1,000 mg by mouth.   . hydrochlorothiazide (HYDRODIURIL) 25 MG tablet Take 25 mg by mouth daily.  . Multiple Vitamins-Minerals (MULTIVITAMIN WITH MINERALS) tablet Take 1 tablet by mouth daily.  [name] Kitchen omeprazole (PRILOSEC) 20 MG capsule Take 1 capsule (20 mg total) by mouth daily.     ROS: Pertinent items noted in HPI and remainder of comprehensive ROS otherwise negative.  Labs/Other Tests and Data  Reviewed:    Recent Labs: 06/07/2018: BUN 15; Creatinine, Ser 1.20; Potassium 4.2; Sodium 141 06/12/2018: Pro B Natriuretic peptide (BNP) 26.0 04/18/2019: ALT 15   Recent Lipid Panel Lab Results  Component Value Date/Time   CHOL 233 (H) 04/18/2019 12:00 PM   TRIG 118 04/18/2019 12:00 PM   HDL 42 04/18/2019 12:00 PM   CHOLHDL 5.5 (H) 04/18/2019 12:00 PM   CHOLHDL 3 06/11/2017 09:20 AM   LDLCALC 170 (H) 04/18/2019 12:00 PM   LDLDIRECT 184.4 02/10/2013 09:59 AM    Wt Readings from Last 3 Encounters:  06/20/18 252 lb (114.3 kg)  06/14/18 257 lb (116.6 kg)  06/12/18 252  lb (114.3 kg)     Exam:    Vital Signs:  BP (!) 185/97   Pulse 92    General appearance: alert and no distress Lungs: No audible respiratory difficulty Abdomen: Obese Extremities: edema None Neurologic: Mental status: Alert, oriented, thought content appropriate  ASSESSMENT & PLAN:    1. Mixed dyslipidemia, goal LDL less than 70 2. Rosuvastatin intolerant-myalgias 3. Coronary artery disease status post four-vessel CABG (2016) 4. Carotid artery disease 5. Type 2 diabetes  Mr. Mau has a mixed dyslipidemia and was recently intolerant to rosuvastatin 40.  He said he had some leg burning which did improve after stopping the medication.  He has not tried other statins to my knowledge and continues on ezetimibe.  I recommend switching to atorvastatin 40 mg nightly.  He is advised to contact us if he has any side effects with that otherwise we will plan repeat lipids in 3 months.  Alternatively, if this is not well-tolerated, then would consider switching to a PCSK9 inhibitor in short order to target LDL less than 70.  Thanks again for the kind referral.  COVID-19 Education: The signs and symptoms of COVID-19 were discussed with the patient and how to seek care for testing (follow up with PCP or arrange E-visit).  The importance of social distancing was discussed today.  Patient Risk:   After full review of this  patients clinical status, I feel that they are at least moderate risk at this time.  Time:   Today, I have spent 25 minutes with the patient with telehealth technology discussing dyslipidemia, statin intolerance, coronary artery disease, carotid artery disease.     Medication Adjustments/Labs and Tests Ordered: Current medicines are reviewed at length with the patient today.  Concerns regarding medicines are outlined above.   Tests Ordered: No orders of the defined types were placed in this encounter.   Medication Changes: No orders of the defined types were placed in this encounter.   Disposition:  in 3 month(s)  [name], MD, [facility], [place] Director of the Advanced Lipid Disorders &  Cardiovascular Risk Reduction Clinic Diplomate of the American Board of Clinical Lipidology Attending Cardiologist  Direct Dial: [phone]  Fax: [phone]  Website:  [email or web address]  [name], MD  05/06/2019 8:16 AM

## 2019-05-06 NOTE — Patient Instructions (Signed)
Medication Instructions:  START atorvastatin 40mg  daily at bedtime Continue all other current medications  *If you need a refill on your cardiac medications before your next appointment, please call your pharmacy*   Lab Work: FASTING lab work in 3 months to check cholesterol  This can be done at any [facility] test site If you prefer to have done at [facility] office, please let us know a date, as we have to schedule an appointment   If you have labs (blood work) drawn today and your tests are completely normal, you will receive your results only by: [name] Kitchen MyChart Message (if you have MyChart) OR . A paper copy in the mail If you have any lab test that is abnormal or we need to change your treatment, we will call you to review the results.   Testing/Procedures: NONE   Follow-Up: At [facility], you and your health needs are our priority.  As part of our continuing mission to provide you with exceptional heart care, we have created designated Provider Care Teams.  These Care Teams include your primary Cardiologist (physician) and Advanced Practice Providers (APPs -  Physician Assistants and Nurse Practitioners) who all work together to provide you with the care you need, when you need it.  We recommend signing up for the patient portal called "MyChart".  Sign up information is provided on this After Visit Summary.  MyChart is used to connect with patients for Virtual Visits (Telemedicine).  Patients are able to view lab/test results, encounter notes, upcoming appointments, etc.  Non-urgent messages can be sent to your provider as well.   To learn more about what you can do with MyChart, go to [email or web address].    Your next appointment:   3 month(s)  The format for your next appointment:   Either In Person or Virtual  Provider:   Raliegh Ip Mali Hilty, MD   Other Instructions

## 2019-05-19 ENCOUNTER — Telehealth: Payer: Self-pay | Admitting: Internal Medicine

## 2019-05-19 NOTE — Telephone Encounter (Signed)
Should also see PCP.  Does he have a hx of gout?  Would see if his PCP can see him prior to appt on Friday. Richardson Dopp, PA-C    05/19/2019 5:43 PM

## 2019-05-19 NOTE — Telephone Encounter (Signed)
New Message   Pt c/o swelling: STAT is pt has developed SOB within 24 hours  1) How much weight have you gained and in what time span? Unsure   2) If swelling, where is the swelling located? Left foot unable to walk since Saturday   3) Are you currently taking a fluid pill? yes  4) Are you currently SOB? No   5) Do you have a log of your daily weights (if so, list)? no  6) Have you gained 3 pounds in a day or 5 pounds in a week?   7) Have you traveled recently? no

## 2019-05-19 NOTE — Telephone Encounter (Signed)
Spoke with pt, he reports swelling in the left foot and ankle that started Saturday. He reports it is painful and is redder than usual. He did noticed that his shoe was giving him a problem toward the end of last week. He denies any SOB and admits he has not weighed in some time. He has not been seen in one year by dr cooper, patient aware will need to be seen prior to prescribing any new medications. Follow up scheduled with [facility] for later this week. He will keep the fopot elevated and call if symptoms change. Pt agreed with this plan.

## 2019-05-20 ENCOUNTER — Telehealth: Payer: Self-pay | Admitting: Cardiovascular Disease

## 2019-05-20 ENCOUNTER — Telehealth: Payer: Self-pay | Admitting: General Practice

## 2019-05-20 NOTE — Telephone Encounter (Signed)
Wheeler is calling requesting pain medication to last him until his appointment with Richardson Dopp scheduled for 05/23/19. He states every time his heart beats he can fill it in his leg due to the excessive swelling. He is unable to get any rest due to the pain and none of the medications for fluid are working. Please advise.

## 2019-05-20 NOTE — Telephone Encounter (Signed)
Agree.  His orthopedist/rheumatologist can evaluated at upcoming appt. Richardson Dopp, PA-C    05/20/2019 4:58 PM

## 2019-05-20 NOTE — Telephone Encounter (Signed)
Sharmon Revere to Cristopher Estimable, RN     05/19/19 5:44 PM Note   Should also see PCP.  Does he have a hx of gout?  Would see if his PCP can see him prior to appt on Friday. Richardson Dopp, PA-C    05/19/2019 5:43 PM       Informed patient of Trinidad Curet response to his previous call made on 05/19/19 about his swelling in left foot. Encouraged patient to see PCP about symptoms. Patient stated he does not have a PCP. Patient stated he is seeing his arthritis doctor tomorrow, and he would see what he says about his swelling and pain in is left foot. Also encouraged patient to go to urgent care if swelling and pain are unbearable since he does not have a PCP. Patient agreed to plan.

## 2019-05-21 DIAGNOSIS — M109 Gout, unspecified: Secondary | ICD-10-CM | POA: Diagnosis not present

## 2019-05-21 DIAGNOSIS — M79672 Pain in left foot: Secondary | ICD-10-CM | POA: Diagnosis not present

## 2019-05-21 NOTE — Telephone Encounter (Signed)
Left message for patient of recommendations. 

## 2019-05-23 ENCOUNTER — Ambulatory Visit: Payer: Medicare PPO | Admitting: Physician Assistant

## 2019-06-27 ENCOUNTER — Other Ambulatory Visit: Payer: Self-pay | Admitting: Physician Assistant

## 2019-07-15 DIAGNOSIS — E785 Hyperlipidemia, unspecified: Secondary | ICD-10-CM

## 2019-08-18 ENCOUNTER — Other Ambulatory Visit: Payer: Medicare PPO | Admitting: *Deleted

## 2019-08-18 ENCOUNTER — Other Ambulatory Visit: Payer: Self-pay

## 2019-08-18 DIAGNOSIS — E785 Hyperlipidemia, unspecified: Secondary | ICD-10-CM

## 2019-08-19 LAB — LIPID PANEL
Chol/HDL Ratio: 5.5 ratio — ABNORMAL HIGH (ref 0.0–5.0)
Cholesterol, Total: 230 mg/dL — ABNORMAL HIGH (ref 100–199)
HDL: 42 mg/dL (ref >39)
LDL Chol Calc (NIH): 165 mg/dL — ABNORMAL HIGH (ref 0–99)
Triglycerides: 127 mg/dL (ref 0–149)
VLDL Cholesterol Cal: 23 mg/dL (ref 5–40)

## 2019-08-19 LAB — PSA: PSA: 7.61

## 2019-08-20 ENCOUNTER — Encounter: Payer: Self-pay | Admitting: Endocrinology

## 2019-08-25 ENCOUNTER — Encounter: Payer: Self-pay | Admitting: *Deleted

## 2019-09-02 ENCOUNTER — Other Ambulatory Visit: Payer: Self-pay

## 2019-09-02 ENCOUNTER — Ambulatory Visit (INDEPENDENT_AMBULATORY_CARE_PROVIDER_SITE_OTHER)
Admission: RE | Admit: 2019-09-02 | Discharge: 2019-09-02 | Disposition: A | Discharge status: Home / Self Care | Payer: Medicare PPO | Source: Ambulatory Visit | Attending: Acute Care | Admitting: Acute Care

## 2019-09-02 DIAGNOSIS — Z87891 Personal history of nicotine dependence: Secondary | ICD-10-CM

## 2019-09-02 DIAGNOSIS — Z122 Encounter for screening for malignant neoplasm of respiratory organs: Secondary | ICD-10-CM

## 2019-09-04 NOTE — Progress Notes (Signed)
Please call patient and let them  know their  low dose Ct was read as a Lung RADS 2: nodules that are benign in appearance and behavior with a very low likelihood of becoming a clinically active cancer due to size or lack of growth. Recommendation per radiology is for a repeat LDCT in 12 months. .Please let them  know we will order and schedule their  annual screening scan for 08/2020. Please let them  know there was notation of CAD on their  scan.  Please remind the patient  that this is a non-gated exam therefore degree or severity of disease  cannot be determined. Please have them  follow up with their PCP regarding potential risk factor modification, dietary therapy or pharmacologic therapy if clinically indicated. Pt.  is  currently on statin therapy. Please place order for annual  screening scan for  08/2020 and fax results to PCP. Thanks so much. 

## 2019-09-05 ENCOUNTER — Encounter: Payer: Self-pay | Admitting: Internal Medicine

## 2019-09-05 ENCOUNTER — Other Ambulatory Visit: Payer: Self-pay

## 2019-09-05 ENCOUNTER — Ambulatory Visit: Payer: Medicare PPO | Admitting: Internal Medicine

## 2019-09-05 VITALS — BP 146/86 | HR 85 | Ht 71.0 in | Wt 259.0 lb

## 2019-09-05 DIAGNOSIS — M791 Myalgia, unspecified site: Secondary | ICD-10-CM

## 2019-09-05 DIAGNOSIS — I2510 Atherosclerotic heart disease of native coronary artery without angina pectoris: Secondary | ICD-10-CM | POA: Diagnosis not present

## 2019-09-05 DIAGNOSIS — E785 Hyperlipidemia, unspecified: Secondary | ICD-10-CM

## 2019-09-05 DIAGNOSIS — T466X5A Adverse effect of antihyperlipidemic and antiarteriosclerotic drugs, initial encounter: Secondary | ICD-10-CM | POA: Diagnosis not present

## 2019-09-05 DIAGNOSIS — I6523 Occlusion and stenosis of bilateral carotid arteries: Secondary | ICD-10-CM

## 2019-09-05 MED ORDER — ATORVASTATIN CALCIUM 10 MG PO TABS: 10.0000 mg | ORAL_TABLET | Freq: Every day | ORAL | 3 refills | Status: DC

## 2019-09-05 NOTE — Patient Instructions (Addendum)
Medication Instructions:  Dr. Debara Pickett recommends atorvastatin 10mg  daily  Dr. Debara Pickett recommends Repatha (PCSK9). This is an injectable cholesterol medication self-administered once every 14 days. This medication will likely need prior approval with your insurance company, which we will work on. If the medication is not approved initially, we may need to do an appeal with your insurance. We will keep you updated on this process.   Administer medication in area of fatty tissue such as abdomen, outer thigh, back up of arm - and rotate site with each injection Store medication in refrigerator until ready to administer - allow to sit at room temp for 30 mins - 1 hour prior to injection Dispose of medication in a SHARPS container - your pharmacy should be able to direct you on this and proper disposal   If you need co-pay assistance grant, please look into the program at healthwellfoundation.org >> disease funds >> hypercholesterolemia. This is an online application or you can call to complete. Once approved, you will provide the "pharmacy card" information to your pharmacy and they will deduct the co-pays from this grant.  If you need a co-pay card for Repatha: [email or web address] >> paying for Repatha or red box that says "Repatha Copay Card" in top right If you need a co-pay card for Praluent: WedMap.it >> starting & paying for Praluent  *If you need a refill on your cardiac medications before your next appointment, please call your pharmacy*   Lab Work: FASTING lipid panel in 3-4 months to check cholesterol  -- this can be done at any [facility]  If you have labs (blood work) drawn today and your tests are completely normal, you will receive your results only by: [name] Kitchen MyChart Message (if you have MyChart) OR . A paper copy in the mail If you have any lab test that is abnormal or we need to change your treatment, we will call you to review the results.   Testing/Procedures: NONE   Follow-Up: At [facility], you and your health needs are our priority.  As part of our continuing mission to provide you with exceptional heart care, we have created designated Provider Care Teams.  These Care Teams include your primary Cardiologist (physician) and Advanced Practice Providers (APPs -  Physician Assistants and Nurse Practitioners) who all work together to provide you with the care you need, when you need it.  We recommend signing up for the patient portal called "MyChart".  Sign up information is provided on this After Visit Summary.  MyChart is used to connect with patients for Virtual Visits (Telemedicine).  Patients are able to view lab/test results, encounter notes, upcoming appointments, etc.  Non-urgent messages can be sent to your provider as well.   To learn more about what you can do with MyChart, go to [email or web address].    Your next appointment:   3-4 month(s) - lipid clinic  The format for your next appointment:   In Person or Virtual   Provider:   K. Mali Hilty, MD   Other Instructions

## 2019-09-06 ENCOUNTER — Encounter: Payer: Self-pay | Admitting: Internal Medicine

## 2019-09-06 NOTE — Progress Notes (Signed)
LIPID CLINIC CONSULT NOTE  Chief Complaint:  Follow-up dyslipidemia  Primary Care Physician: Patient, No Pcp Per  Primary Cardiologist:  [name], MD  HPI:  Shane [name] is a 69 y.o. male who is being seen today for the evaluation of dyslipidemia at the request of Shane Dopp, PA-C. [name] 69 year old male followed by Shane [name].  He has a history of coronary artery disease and prior CABG x4 in 2016 as well as type 2 diabetes, dyslipidemia and carotid artery disease.  He has had longstanding dyslipidemia was previously better controlled on rosuvastatin 40 mg and ezetimibe 10 mg daily.  He was developing some symptoms of burning in his legs and discontinue the medication due to the thought it might be a myalgia.  He said that did improve somewhat, although his cholesterol accordingly increased.  His most recent labs 2 weeks ago showed total cholesterol 233, triglycerides 118, HDL 42 and LDL 170.  About a year ago his total cholesterol was 165.  He also reports of dietary changes and less activity as result of Covid.  He continues to take the ezetimibe and says he restarted the rosuvastatin when he saw a high his numbers were despite the fact that he had some side effects.  09/05/2019  Mr. Kaspar is seen today for follow-up.  Repeat lipids were performed showing total cholesterol now 230, triglycerides 127, HDL 42 and LDL 165 which is fairly stable with his lipids 4 and 8 months ago.  Recommended changing from high potency rosuvastatin to atorvastatin 40 mg nightly.  Unfortunately this resulted in the same side effects, namely myalgias leading to discontinuation in the medication and that is why his cholesterol is not significantly improved.  His goal LDL is less than 70 given prior CABG and since he cannot tolerate either atorvastatin or rosuvastatin at high potency doses, he is now to be considered for PCSK9 inhibitor.  PMHx:  Past Medical History:  Diagnosis Date  . Asthma     PMH:as a child  . Carotid stenosis    a. Carotid US 3/16:  bilat ICA 1-39% // b. Carotid US 6/18: Stable bilateral 1-39 ICA  . CIGARETTE SMOKER 11/30/2009   Qualifier: Diagnosis of  By: Loanne Drilling MD, Jacelyn Pi   . Colon polyps 06/17/2011  . Coronary artery disease    a. Myoview 3/16:  Inf ischemia, EF 48%, int risk;  b.  LHC 3/16:  3 v CAD,  c.  s/p CABG x 4 03/2014  . DIABETES MELLITUS, TYPE II 09/25/2006   pt denies  . Diverticulosis 06/17/2011  . Echocardiograms    Echocardiogram 08/2018:  EF 55-60, mod conc LVH, Gr 1 DD, normal RVSF, mild calc of AoV  . GERD (gastroesophageal reflux disease)   . Hx of cardiovascular stress test    Lexiscan Myoview 3/16:  Inferior ischemia, EF 48%, Intermediate Risk  . Hx of echocardiogram    a.  Echo 3/16:  EF 50-55%, Gr 1 DD  . HYPERCHOLESTEROLEMIA   . HYPERTENSION   . INSOMNIA 09/10/2008  . PSA, INCREASED 11/30/2009  . RENAL INSUFFICIENCY 09/25/2006  . Tinnitus   . Wears glasses     Past Surgical History:  Procedure Laterality Date  . CATARACT EXTRACTION W/ INTRAOCULAR LENS  IMPLANT, BILATERAL    . COLONOSCOPY  06/02/2015  . CORONARY ARTERY BYPASS GRAFT N/A 03/23/2014   Procedure: CORONARY ARTERY BYPASS GRAFTING (CABG), ON PUMP, TIMES FOUR, USING LEFT INTERNAL MAMMARY ARTERY, BILATERAL GREATER SAPHENOUS VEINS HARVESTED ENDOSCOPICALLY;  Surgeon: Ivin Poot, MD;  Location: [facility];  Service: Open Heart Surgery;  Laterality: N/A;  . HERNIA REPAIR    . LEFT HEART CATHETERIZATION WITH CORONARY ANGIOGRAM N/A 03/18/2014   Procedure: LEFT HEART CATHETERIZATION WITH CORONARY ANGIOGRAM;  Surgeon: [name], MD;  Location: [facility] C[facility];  Service: Cardiovascular;  Laterality: N/A;  . POLYPECTOMY    . TEE WITHOUT CARDIOVERSION N/A 03/23/2014   Procedure: TRANSESOPHAGEAL ECHOCARDIOGRAM (TEE);  Surgeon: Ivin Poot, MD;  Location: [facility];  Service: Open Heart Surgery;  Laterality: N/A;  . TONSILLECTOMY      FAMHx:  Family History  Problem Relation Age of  Onset  . Cancer Father   . Hypertension Father   . Heart disease Mother   . Heart disease Sister   . Kidney failure Sister   . Colon cancer Sister 2  . Arrhythmia Sister   . Heart attack Maternal Grandmother   . Stroke Neg Hx   . Esophageal cancer Neg Hx   . Stomach cancer Neg Hx   . Colon polyps Neg Hx     SOCHx:   reports that he quit smoking about 5 years ago. His smoking use included cigarettes. He has a 100.00 pack-year smoking history. He has never used smokeless tobacco. He reports current alcohol use of about 21.0 standard drinks of alcohol per week. He reports that he does not use drugs.  ALLERGIES:  Allergies  Allergen Reactions  . Atorvastatin Other (See Comments)    Myalgias   . Crestor [Rosuvastatin] Other (See Comments)    Muscle pains    ROS: Pertinent items noted in HPI and remainder of comprehensive ROS otherwise negative.  HOME MEDS: Current Outpatient Medications on File Prior to Visit  Medication Sig Dispense Refill  . aspirin 81 MG tablet Take 1 tablet (81 mg total) by mouth daily.    . Garlic 4098 MG CAPS Take 1,000 mg by mouth.     . hydrochlorothiazide (HYDRODIURIL) 25 MG tablet Take 1 tablet by mouth daily 30 tablet 5  . Multiple Vitamins-Minerals (MULTIVITAMIN WITH MINERALS) tablet Take 1 tablet by mouth daily.    [name] Kitchen omeprazole (PRILOSEC) 20 MG capsule Take 1 capsule (20 mg total) by mouth daily. 90 capsule 3  . ezetimibe (ZETIA) 10 MG tablet Take 1 tablet (10 mg total) by mouth daily. (Patient not taking: Reported on 09/05/2019) 90 tablet 3   No current facility-administered medications on file prior to visit.    LABS/IMAGING: No results found for this or any previous visit (from the past 48 hour(s)). No results found.  LIPID PANEL:    Component Value Date/Time   CHOL 230 (H) 08/18/2019 0000   TRIG 127 08/18/2019 0000   HDL 42 08/18/2019 0000   CHOLHDL 5.5 (H) 08/18/2019 0000   CHOLHDL 3 06/11/2017 0920   VLDL 17.2 06/11/2017 0920    LDLCALC 165 (H) 08/18/2019 0000   LDLDIRECT 184.4 02/10/2013 0959    WEIGHTS: Wt Readings from Last 3 Encounters:  09/05/19 259 lb (117.5 kg)  06/20/18 252 lb (114.3 kg)  06/14/18 257 lb (116.6 kg)    VITALS: BP (!) 146/86   Pulse 85   Ht 5\' 11"  (1.803 m)   Wt 259 lb (117.5 kg)   SpO2 95%   BMI 36.12 kg/m   EXAM: Deferred  EKG: Deferred  ASSESSMENT: 1. Mixed dyslipidemia, goal LDL less than 70 2. Statin intolerant-myalgias with high potency atorvastatin and rosuvastatin 3. Coronary artery disease status post CABG  PLAN:  1.   Mr. Bathgate cannot tolerate high potency atorvastatin or rosuvastatin.  I advised decreasing the atorvastatin dose down to 10 mg daily to see if it would be tolerated and will go ahead and pursue a PCSK9 inhibitor.  Target of LDL less than 70 but I suspect he will need at least a little bit of statin in order to reach that target.  Plan repeat lipids in about 3 months and follow-up with me at that time.  [name], MD, [facility], [place] Director of the Advanced Lipid Disorders &  Cardiovascular Risk Reduction Clinic Diplomate of the American Board of Clinical Lipidology Attending Cardiologist  Direct Dial: [phone]  Fax: [phone]  Website:  [email or web address] 09/06/2019, 4:47 PM

## 2019-09-10 ENCOUNTER — Other Ambulatory Visit: Payer: Self-pay | Admitting: *Deleted

## 2019-09-10 DIAGNOSIS — Z87891 Personal history of nicotine dependence: Secondary | ICD-10-CM

## 2019-09-12 ENCOUNTER — Telehealth: Payer: Self-pay | Admitting: Internal Medicine

## 2019-09-12 NOTE — Telephone Encounter (Signed)
Patient is calling to follow up regarding cholesterol medication discussed during appointment on 09/05/19 with Dr. Debara Pickett. He would like to know if he is approved for medication. Please advise.

## 2019-09-12 NOTE — Telephone Encounter (Signed)
Pt informed that nurse would follow up with him on this.

## 2019-09-18 MED ORDER — REPATHA SURECLICK 140 MG/ML ~~LOC~~ SOAJ: 1.0000 | SUBCUTANEOUS | 11 refills | Status: DC

## 2019-09-18 NOTE — Telephone Encounter (Signed)
PA for repatha submitted via CMM (Key: [id number]) PA Case: [id number], Status: Approved, Coverage Starts on: 09/18/2019 12:00:00 AM, Coverage Ends on: 03/16/2020 12:00:00 AM

## 2019-10-20 ENCOUNTER — Ambulatory Visit: Payer: Self-pay | Admitting: Family Medicine

## 2019-10-20 ENCOUNTER — Other Ambulatory Visit: Payer: Self-pay

## 2019-10-20 ENCOUNTER — Ambulatory Visit: Payer: Medicare PPO | Admitting: Family Medicine

## 2019-10-20 ENCOUNTER — Encounter: Payer: Self-pay | Admitting: Family Medicine

## 2019-10-20 VITALS — BP 120/80 | HR 83 | Ht 70.75 in | Wt 257.0 lb

## 2019-10-20 DIAGNOSIS — I700 Atherosclerosis of aorta: Secondary | ICD-10-CM

## 2019-10-20 DIAGNOSIS — Z23 Encounter for immunization: Secondary | ICD-10-CM

## 2019-10-20 DIAGNOSIS — H9319 Tinnitus, unspecified ear: Secondary | ICD-10-CM | POA: Diagnosis not present

## 2019-10-20 DIAGNOSIS — E11A Type 2 diabetes mellitus without complications in remission: Secondary | ICD-10-CM

## 2019-10-20 DIAGNOSIS — I2510 Atherosclerotic heart disease of native coronary artery without angina pectoris: Secondary | ICD-10-CM | POA: Diagnosis not present

## 2019-10-20 DIAGNOSIS — E119 Type 2 diabetes mellitus without complications: Secondary | ICD-10-CM | POA: Diagnosis not present

## 2019-10-20 DIAGNOSIS — I10 Essential (primary) hypertension: Secondary | ICD-10-CM

## 2019-10-20 DIAGNOSIS — R972 Elevated prostate specific antigen [PSA]: Secondary | ICD-10-CM

## 2019-10-20 DIAGNOSIS — Z951 Presence of aortocoronary bypass graft: Secondary | ICD-10-CM | POA: Diagnosis not present

## 2019-10-20 DIAGNOSIS — E538 Deficiency of other specified B group vitamins: Secondary | ICD-10-CM | POA: Diagnosis not present

## 2019-10-20 DIAGNOSIS — R7303 Prediabetes: Secondary | ICD-10-CM | POA: Diagnosis not present

## 2019-10-20 NOTE — Progress Notes (Signed)
   Subjective:    Patient ID: Shane [name], male    DOB: 05-13-1950, 69 y.o.   MRN: [id number]  HPI Chief Complaint  Patient presents with  . new pt    new pt, get established. ringing in ears for years- due to working in loud machinery,sometimes get pain in ear or mouth, wants PSA level checked   He is new to the practice and here to establish care.  Previous medical care: Dr. Loanne Drilling   He has several specialists.    Other providers: Cardiologist- Dr. Debara Pickett [facility]- Dr. Venia Minks  Pulmonologist- Eric Form, NP Orthopedist- Dr. Gladstone Lighter  Eyes- Dr. Gershon Crane  GI- Dr. Remi Deter he is having a biopsy in 3 days for prostate concerns with elevated PSA.   HL- he is using Repatha. States he did not tolerate statins or Zetia.  CAD with Quadruple Bypass in 2016 Aortic atherosclerosis on CT.   He does not recall having diabetes and is not sure why this diagnosis is in his chart.  States he has only had prediabetes. Is not on medication for DM.   Former smoker- stopped in 2016. He is followed by pulmonology with LDCT screenings. Emphysema on scan.   History of B12 deficiency. Is not currently taking a supplement.   Lives with fiance.   Denies fever, chills, dizziness, chest pain, palpitations, shortness of breath, abdominal pain, N/V/D, LE edema.    Reviewed allergies, medications, past medical, surgical, family, and social history.    Review of Systems Pertinent positives and negatives in the history of present illness.     Objective:   Physical Exam BP 120/80   Pulse 83   Ht 5' 10.75" (1.797 m)   Wt 257 lb (116.6 kg)   BMI 36.10 kg/m   Alert and in no distress. Cardiac exam shows a regular sinus rhythm without murmurs or gallops. Lungs are clear to auscultation. Extremities without edema. Skin is warm and dry.       Assessment & Plan:  Essential hypertension - Plan: CBC with Differential/Platelet, Comprehensive metabolic panel He is a pleasant new  patient who is establishing care with me.  Reports being in his usual state of health.  BP controlled. Continue medication, low sodium diet and follow up with cardiology.   Coronary artery disease involving native coronary artery of native heart without angina pectoris Continue Repatha and follow-up with cardiology  COVID-19 vaccine administered - Plan: Pfizer SARS-COV-2 Vaccine  S/P CABG x 4  Vitamin B12 deficiency - Plan: Vitamin B12 -Check vitamin B12 level and follow-up.  He is not currently taking a supplement  Tinnitus, unspecified laterality -Chronic and unchanged  PSA, INCREASED -Has appointment with urology later this week for biopsy  Prediabetes - Plan: Comprehensive metabolic panel, Hemoglobin A1c, Microalbumin / creatinine urine ratio -Check A1c and follow-up.  Counseling on healthy diet  Type 2 diabetes mellitus in remission ([place]) - Plan: Microalbumin / creatinine urine ratio -Unclear as to why he has a diagnosis of diabetes.  Review of his EMR does not show hemoglobin A1c in diabetes range and he does not recall ever being told he has diabetes.  I suspect if he did have diabetes at one point that he is now in remission with A1c's in the prediabetes range. we will keep an eye on this  Aortic atherosclerosis ([place]) -Continue Repatha and low-fat, low-cholesterol diet.  Follow with cardiology as scheduled

## 2019-10-21 ENCOUNTER — Encounter (INDEPENDENT_AMBULATORY_CARE_PROVIDER_SITE_OTHER): Payer: Self-pay

## 2019-10-21 ENCOUNTER — Encounter: Payer: Self-pay | Admitting: Family Medicine

## 2019-10-21 DIAGNOSIS — N183 Chronic kidney disease, stage 3 unspecified: Secondary | ICD-10-CM

## 2019-10-21 DIAGNOSIS — E119 Type 2 diabetes mellitus without complications: Secondary | ICD-10-CM | POA: Insufficient documentation

## 2019-10-21 DIAGNOSIS — R809 Proteinuria, unspecified: Secondary | ICD-10-CM

## 2019-10-21 DIAGNOSIS — I700 Atherosclerosis of aorta: Secondary | ICD-10-CM | POA: Insufficient documentation

## 2019-10-21 DIAGNOSIS — E11A Type 2 diabetes mellitus without complications in remission: Secondary | ICD-10-CM | POA: Insufficient documentation

## 2019-10-21 DIAGNOSIS — R7303 Prediabetes: Secondary | ICD-10-CM | POA: Insufficient documentation

## 2019-10-21 HISTORY — DX: Chronic kidney disease, stage 3 unspecified: N18.30

## 2019-10-21 HISTORY — DX: Proteinuria, unspecified: R80.9

## 2019-10-21 LAB — COMPREHENSIVE METABOLIC PANEL
ALT: 24 IU/L (ref 0–44)
AST: 27 IU/L (ref 0–40)
Albumin/Globulin Ratio: 1.7 (ref 1.2–2.2)
Albumin: 4.2 g/dL (ref 3.7–4.7)
Alkaline Phosphatase: 120 IU/L (ref 44–121)
BUN/Creatinine Ratio: 10 (ref 10–24)
BUN: 16 mg/dL (ref 8–27)
Bilirubin Total: 0.4 mg/dL (ref 0.0–1.2)
CO2: 25 mmol/L (ref 20–29)
Calcium: 9.2 mg/dL (ref 8.6–10.2)
Chloride: 102 mmol/L (ref 96–106)
Creatinine, Ser: 1.59 mg/dL — ABNORMAL HIGH (ref 0.76–1.27)
GFR calc Af Amer: 50 mL/min/{1.73_m2} — ABNORMAL LOW (ref >59)
GFR calc non Af Amer: 43 mL/min/{1.73_m2} — ABNORMAL LOW (ref >59)
Globulin, Total: 2.5 g/dL (ref 1.5–4.5)
Glucose: 77 mg/dL (ref 65–99)
Potassium: 5 mmol/L (ref 3.5–5.2)
Sodium: 141 mmol/L (ref 134–144)
Total Protein: 6.7 g/dL (ref 6.0–8.5)

## 2019-10-21 LAB — VITAMIN B12: Vitamin B-12: 281 pg/mL (ref 232–1245)

## 2019-10-21 LAB — CBC WITH DIFFERENTIAL/PLATELET
Basophils Absolute: 0.1 10*3/uL (ref 0.0–0.2)
Basos: 1 %
EOS (ABSOLUTE): 0.3 10*3/uL (ref 0.0–0.4)
Eos: 4 %
Hematocrit: 43.6 % (ref 37.5–51.0)
Hemoglobin: 14.2 g/dL (ref 13.0–17.7)
Immature Grans (Abs): 0.1 10*3/uL (ref 0.0–0.1)
Immature Granulocytes: 1 %
Lymphocytes Absolute: 1.2 10*3/uL (ref 0.7–3.1)
Lymphs: 14 %
MCH: 25.7 pg — ABNORMAL LOW (ref 26.6–33.0)
MCHC: 32.6 g/dL (ref 31.5–35.7)
MCV: 79 fL (ref 79–97)
Monocytes Absolute: 1.1 10*3/uL — ABNORMAL HIGH (ref 0.1–0.9)
Monocytes: 13 %
Neutrophils Absolute: 5.8 10*3/uL (ref 1.4–7.0)
Neutrophils: 67 %
Platelets: 238 10*3/uL (ref 150–450)
RBC: 5.52 x10E6/uL (ref 4.14–5.80)
RDW: 14.9 % (ref 11.6–15.4)
WBC: 8.7 10*3/uL (ref 3.4–10.8)

## 2019-10-21 LAB — MICROALBUMIN / CREATININE URINE RATIO
Creatinine, Urine: 118.9 mg/dL
Microalb/Creat Ratio: 121 mg/g creat — ABNORMAL HIGH (ref 0–29)
Microalbumin, Urine: 144.2 ug/mL

## 2019-10-21 LAB — HEMOGLOBIN A1C
Est. average glucose Bld gHb Est-mCnc: 117 mg/dL
Hgb A1c MFr Bld: 5.7 % — ABNORMAL HIGH (ref 4.8–5.6)

## 2019-10-21 NOTE — Progress Notes (Signed)
Please refer him to a nephrologist if he does not have one. His CKD is worsening and for microalbuminuria.

## 2019-10-22 ENCOUNTER — Other Ambulatory Visit: Payer: Self-pay | Admitting: Internal Medicine

## 2019-10-22 DIAGNOSIS — R809 Proteinuria, unspecified: Secondary | ICD-10-CM

## 2019-10-22 DIAGNOSIS — N189 Chronic kidney disease, unspecified: Secondary | ICD-10-CM

## 2019-10-30 ENCOUNTER — Other Ambulatory Visit: Payer: Self-pay | Admitting: Internal Medicine

## 2019-10-30 MED ORDER — OMEPRAZOLE 20 MG PO CPDR: 20.0000 mg | DELAYED_RELEASE_CAPSULE | Freq: Every day | ORAL | 1 refills | Status: DC

## 2019-10-30 MED ORDER — REPATHA SURECLICK 140 MG/ML ~~LOC~~ SOAJ: 1.0000 | SUBCUTANEOUS | 11 refills | Status: DC

## 2019-10-31 ENCOUNTER — Other Ambulatory Visit: Payer: Self-pay | Admitting: *Deleted

## 2019-10-31 MED ORDER — HYDROCHLOROTHIAZIDE 25 MG PO TABS: 25.0000 mg | ORAL_TABLET | Freq: Every day | ORAL | 1 refills | Status: DC

## 2019-10-31 NOTE — Telephone Encounter (Signed)
Rx has been sent to the pharmacy electronically. ° °

## 2019-11-18 DIAGNOSIS — R972 Elevated prostate specific antigen [PSA]: Secondary | ICD-10-CM | POA: Diagnosis not present

## 2019-12-12 DIAGNOSIS — M109 Gout, unspecified: Secondary | ICD-10-CM | POA: Diagnosis not present

## 2019-12-12 DIAGNOSIS — N1831 Chronic kidney disease, stage 3a: Secondary | ICD-10-CM | POA: Diagnosis not present

## 2019-12-12 DIAGNOSIS — D649 Anemia, unspecified: Secondary | ICD-10-CM | POA: Diagnosis not present

## 2019-12-12 DIAGNOSIS — I129 Hypertensive chronic kidney disease with stage 1 through stage 4 chronic kidney disease, or unspecified chronic kidney disease: Secondary | ICD-10-CM | POA: Diagnosis not present

## 2020-01-07 NOTE — Progress Notes (Signed)
Shane [name] is a 69 y.o. male who presents for annual wellness visit, CPE and follow-up on chronic medical conditions.  He has the following concerns:  States his memory is "cloudy" at times. States he is doing well overall and does not want to pursue a work up for this.   HTN- HCTZ daily. Low sodium diet mostly.   CAD and HL - Repatha injections self administers every 14 days.   Gout- flare up last week in right ankle. He is taking allopurinol daily. Colchicine prn  Taking vitamin B12 dailiy   Prediabetes- last Hgb A1c 5.7%     Immunization History  Administered Date(s) Administered  . Influenza Whole 11/30/2009  . Influenza,inj,Quad PF,6+ Mos 10/20/2013  . Influenza,inj,Quad PF,6-35 Mos 12/19/2014  . Influenza-Unspecified 10/19/2016  . PFIZER SARS-COV-2 Vaccination 03/02/2019, 03/26/2019, 10/20/2019  . Pneumococcal Conjugate-13 10/20/2013, 04/23/2016  . Pneumococcal Polysaccharide-23 11/30/2009, 04/10/2013  . Td 09/02/2008  . Tdap 12/06/2014  . Zoster 08/08/2011   Last colonoscopy: 06/2018 and due in 06/2021  Last PSA:  8/21 and followed by urology  Dentist: has dentures Ophtho: [address]. Gershon Crane  Exercise: walking for his job.   Works in [employer]. He has his own business.  Separated and getting a divorce. Has a new girlfriend.   2 kids. 1 son and 1 daughter   Other doctors caring for patient include: Cardiologist- Dr. Debara Pickett Pulmonology- Eric Form, NP Nephrologist-  [facility]ist- [facility]- Dr. Fuller Plan  Eye doctor- Dr. Inocencio Homes office.   Immunization: all 3 Covid vaccines are done.     Depression screen:  See questionnaire below.     Depression screen [facility] 2/9 01/08/2020 10/20/2019  Decreased Interest 0 0  Down, Depressed, Hopeless 0 0  PHQ - 2 Score 0 0    Fall Screen: See Questionaire below.   Fall Risk  01/08/2020 10/20/2019 08/18/2013  Falls in the past year? 0 0 Yes  Number falls in past yr: 0 0 -  Injury with Fall? 0 0 -    ADL  screen:  See questionnaire below.  Functional Status Survey: Is the patient deaf or have difficulty hearing?: No Does the patient have difficulty seeing, even when wearing glasses/contacts?: No Does the patient have difficulty concentrating, remembering, or making decisions?: No Does the patient have difficulty walking or climbing stairs?: No Does the patient have difficulty dressing or bathing?: No Does the patient have difficulty doing errands alone such as visiting a doctor's office or shopping?: No   End of Life Discussion:  Patient does not have a living will and medical power of attorney. Fiance, Patrica Duel will be his HCPOA. MOST form filled out   Review of Systems  Constitutional: -fever, -chills, -sweats, -unexpected weight change, -anorexia, -fatigue Allergy: -sneezing, -itching, -congestion Dermatology: denies changing moles, rash, lumps, new worrisome lesions ENT: -runny nose, -ear pain, -sore throat, -hoarseness, -sinus pain, -teeth pain, -tinnitus, -hearing loss, -epistaxis Cardiology:  -chest pain, -palpitations, -edema, -orthopnea, -paroxysmal nocturnal dyspnea Respiratory: -cough, -shortness of breath, -dyspnea on exertion, -wheezing, -hemoptysis Gastroenterology: -abdominal pain, -nausea, -vomiting, -diarrhea, -constipation, -blood in stool, -changes in bowel movement, -dysphagia Hematology: -bleeding or bruising problems Musculoskeletal: -arthralgias, -myalgias, -joint swelling, -back pain, -neck pain, -cramping, -gait changes Ophthalmology: -vision changes, -eye redness, -itching, -discharge Urology: -dysuria, -difficulty urinating, -hematuria, -urinary frequency, -urgency, incontinence Neurology: -headache, -weakness, -tingling, -numbness, -speech abnormality, -memory loss, -falls, -dizziness Psychology:  -depressed mood, -agitation, -sleep problems   PHYSICAL EXAM:  BP 130/90   Pulse 97  Ht 5\' 11"  (1.803 m)   Wt 259 lb 9.6 oz (117.8 kg)   BMI 36.21 kg/m    General Appearance: Alert, cooperative, no distress, appears stated age Head: Normocephalic, without obvious abnormality, atraumatic Eyes: PERRL, conjunctiva/corneas clear, EOM's intact Ears: Normal TM's and external ear canals Nose: mask on  Throat: mask on  Neck: Supple, no lymphadenopathy, thyroid:no enlargement/tenderness/nodules; no JVD Back: Spine nontender, no curvature, ROM normal, no CVA tenderness Lungs: Clear to auscultation bilaterally without wheezes, rales or ronchi; respirations unlabored Chest Wall: No tenderness or deformity Heart: Regular rate and rhythm  Breast Exam: No chest wall tenderness, masses or gynecomastia Abdomen: Soft, obese, non-tender, normoactive bowel sounds, no palpable masses. Small umbilical hernia easily reduced.  Genitalia: declines. Urologist  Extremities: No clubbing, cyanosis or edema Pulses: 2+ and symmetric all extremities Skin: Skin color, texture, turgor normal, no rashes or lesions Lymph nodes: Cervical, supraclavicular, and axillary nodes normal Neurologic: CNII-XII intact, normal strength, sensation and gait; reflexes 2+ and symmetric throughout   Psych: Normal mood, affect, hygiene and grooming  ASSESSMENT/PLAN: Medicare annual wellness visit, subsequent -He is here today for his Medicare wellness visit. Denies difficulty with ADLs, depression and no falls. He admits to some mild memory concerns but does not want to pursue a work-up for this today. States he will let me know if this becomes more of an issue. Advanced directives discussed and documents provided. MOST form was discussed and filled out. Medications reviewed.  Routine general medical examination at a health care facility - Plan: CBC with Differential/Platelet, Comprehensive metabolic panel, TSH, Lipid panel -Preventive health care reviewed. He has several specialists. Urologist follows PSA and prostate health. Appears to be up-to-date on colonoscopy. Counseling on healthy  lifestyle including diet and exercise. Advised him to avoid alcohol. Immunizations reviewed. Discussed safety  Coronary artery disease involving native coronary artery of native heart without angina pectoris - Plan: Lipid panel -Followed closely by cardiology. Continue Repatha.  Aortic atherosclerosis (HCC) - Plan: Lipid panel -Continue Repatha. Continue seeing cardiology for cholesterol and CAD  Essential hypertension -Recommend keeping a close eye on his blood pressure. He is aware that is elevated today. Continue seeing cardiology for this  HYPERCHOLESTEROLEMIA - Plan: Lipid panel -Recommend low-fat, low-cholesterol diet. Continue Repatha  Stage 3 chronic kidney disease, unspecified whether stage 3a or 3b CKD (HCC) - Plan: Comprehensive metabolic panel -He is aware that he has chronic kidney disease. Discussed importance of good blood pressure control. Check labs in follow-up. Also request his recent nephrology visit notes.  Needs flu shot - Plan: Flu Vaccine QUAD High Dose(Fluad)  Prediabetes - Plan: TSH -Recommend low sugar and low carb diet.  Chronic gout of ankle, unspecified cause, unspecified laterality - Plan: Uric acid -Currently taking allopurinol 100 mg daily. He is having fairly frequent gout flares. Discussed low purine diet and this was also provided in his after visit summary. Check uric acid level and follow-up.     Discussed PSA screening (risks/benefits), recommended at least 30 minutes of aerobic activity at least 5 days/week; proper sunscreen use reviewed; healthy diet and alcohol recommendations (less than or equal to 2 drinks/day) reviewed; regular seatbelt use; changing batteries in smoke detectors. Immunization recommendations discussed.  Colonoscopy recommendations reviewed.   Medicare Attestation I have personally reviewed: The patient's medical and social history Their use of alcohol, tobacco or illicit drugs Their current medications and  supplements The patient's functional ability including ADLs,fall risks, home safety risks, cognitive, and hearing and visual impairment Diet and  physical activities Evidence for depression or mood disorders  The patient's weight, height, and BMI have been recorded in the chart.  I have made referrals, counseling, and provided education to the patient based on review of the above and I have provided the patient with a written personalized care plan for preventive services.     Harland Dingwall, NP-C   01/08/2020

## 2020-01-08 ENCOUNTER — Encounter: Payer: Self-pay | Admitting: Family Medicine

## 2020-01-08 ENCOUNTER — Ambulatory Visit: Payer: Medicare PPO | Admitting: Family Medicine

## 2020-01-08 VITALS — BP 130/90 | HR 97 | Ht 71.0 in | Wt 259.6 lb

## 2020-01-08 DIAGNOSIS — I2510 Atherosclerotic heart disease of native coronary artery without angina pectoris: Secondary | ICD-10-CM | POA: Diagnosis not present

## 2020-01-08 DIAGNOSIS — I700 Atherosclerosis of aorta: Secondary | ICD-10-CM

## 2020-01-08 DIAGNOSIS — I10 Essential (primary) hypertension: Secondary | ICD-10-CM | POA: Diagnosis not present

## 2020-01-08 DIAGNOSIS — R7303 Prediabetes: Secondary | ICD-10-CM

## 2020-01-08 DIAGNOSIS — M1A0790 Idiopathic chronic gout, unspecified ankle and foot, without tophus (tophi): Secondary | ICD-10-CM

## 2020-01-08 DIAGNOSIS — Z23 Encounter for immunization: Secondary | ICD-10-CM

## 2020-01-08 DIAGNOSIS — Z0000 Encounter for general adult medical examination without abnormal findings: Secondary | ICD-10-CM | POA: Diagnosis not present

## 2020-01-08 DIAGNOSIS — N183 Chronic kidney disease, stage 3 unspecified: Secondary | ICD-10-CM | POA: Diagnosis not present

## 2020-01-08 DIAGNOSIS — E7800 Pure hypercholesterolemia, unspecified: Secondary | ICD-10-CM | POA: Diagnosis not present

## 2020-01-08 NOTE — Patient Instructions (Signed)
Low-Purine Eating Plan A low-purine eating plan involves making food choices to limit your intake of purine. Purine is a kind of uric acid. Too much uric acid in your blood can cause certain conditions, such as gout and kidney stones. Eating a low-purine diet can help control these conditions. What are tips for following this plan? Reading food labels   Avoid foods with saturated or Trans fat.  Check the ingredient list of grains-based foods, such as bread and cereal, to make sure that they contain whole grains.  Check the ingredient list of sauces or soups to make sure they do not contain meat or fish.  When choosing soft drinks, check the ingredient list to make sure they do not contain high-fructose corn syrup. Shopping  Buy plenty of fresh fruits and vegetables.  Avoid buying canned or fresh fish.  Buy dairy products labeled as low-fat or nonfat.  Avoid buying premade or processed foods. These foods are often high in fat, salt (sodium), and added sugar. Cooking  Use olive oil instead of butter when cooking. Oils like olive oil, canola oil, and sunflower oil contain healthy fats. Meal planning  Learn which foods do or do not affect you. If you find out that a food tends to cause your gout symptoms to flare up, avoid eating that food. You can enjoy foods that do not cause problems. If you have any questions about a food item, talk with your dietitian or health care provider.  Limit foods high in fat, especially saturated fat. Fat makes it harder for your body to get rid of uric acid.  Choose foods that are lower in fat and are lean sources of protein. General guidelines  Limit alcohol intake to no more than 1 drink a day for nonpregnant women and 2 drinks a day for men. One drink equals 12 oz of beer, 5 oz of wine, or 1 oz of hard liquor. Alcohol can affect the way your body gets rid of uric acid.  Drink plenty of water to keep your urine clear or pale yellow. Fluids can  help remove uric acid from your body.  If directed by your health care provider, take a vitamin C supplement.  Work with your health care provider and dietitian to develop a plan to achieve or maintain a healthy weight. Losing weight can help reduce uric acid in your blood. What foods are recommended? The items listed may not be a complete list. Talk with your dietitian about what dietary choices are best for you. Foods low in purines Foods low in purines do not need to be limited. These include:  All fruits.  All low-purine vegetables, pickles, and olives.  Breads, pasta, rice, cornbread, and popcorn. Cake and other baked goods.  All dairy foods.  Eggs, nuts, and nut butters.  Spices and condiments, such as salt, herbs, and vinegar.  Plant oils, butter, and margarine.  Water, sugar-free soft drinks, tea, coffee, and cocoa.  Vegetable-based soups, broths, sauces, and gravies. Foods moderate in purines Foods moderate in purines should be limited to the amounts listed.   cup of asparagus, cauliflower, spinach, mushrooms, or green peas, each day.  2/3 cup uncooked oatmeal, each day.   cup dry wheat bran or wheat germ, each day.  2-3 ounces of meat or poultry, each day.  4-6 ounces of shellfish, such as crab, lobster, oysters, or shrimp, each day.  1 cup cooked beans, peas, or lentils, each day.  Soup, broths, or bouillon made from meat  or fish. Limit these foods as much as possible. What foods are not recommended? The items listed may not be a complete list. Talk with your dietitian about what dietary choices are best for you. Limit your intake of foods high in purines, including:  Beer and other alcohol.  Meat-based gravy or sauce.  Canned or fresh fish, such as: ? Anchovies, sardines, herring, and tuna. ? Mussels and scallops. ? Codfish, trout, and haddock.  Shane [name].  Organ meats, such as: ? Liver or kidney. ? Tripe. ? Sweetbreads (thymus gland or  pancreas).  Wild Education officer, environmental.  Yeast or yeast extract supplements.  Drinks sweetened with high-fructose corn syrup. Summary  Eating a low-purine diet can help control conditions caused by too much uric acid in the body, such as gout or kidney stones.  Choose low-purine foods, limit alcohol, and limit foods high in fat.  You will learn over time which foods do or do not affect you. If you find out that a food tends to cause your gout symptoms to flare up, avoid eating that food. This information is not intended to replace advice given to you by your health care provider. Make sure you discuss any questions you have with your health care provider. Document Revised: 12/08/2016 Document Reviewed: 02/09/2016 Elsevier Patient Education  2020 [employer].  Shane [name] , Thank you for taking time to come for your Medicare Wellness Visit. I appreciate your ongoing commitment to your health goals. Please review the following plan we discussed and let me know if I can assist you in the future.   These are the goals we discussed:  Cut back on beer, red meat and foods that increase gout flare ups.   Fill out your advance directive paperwork and return them to our office for [place] to make a copy.   I will be in touch with your lab results.    This is a list of the screening recommended for you and due dates:  Health Maintenance  Topic Date Due   Complete foot exam   06/12/2018   Eye exam for diabetics  03/09/2019   Flu Shot  08/10/2019   Hemoglobin A1C  04/19/2020   Urine Protein Check  10/19/2020   Colon Cancer Screening  06/19/2021   Tetanus Vaccine  12/05/2024   COVID-19 Vaccine  Completed    Hepatitis C: One time screening is recommended by Center for Disease Control  (CDC) for  adults born from [birth year] through 1967.   Completed   Pneumonia vaccines  Completed

## 2020-01-09 LAB — COMPREHENSIVE METABOLIC PANEL
ALT: 24 IU/L (ref 0–44)
AST: 23 IU/L (ref 0–40)
Albumin/Globulin Ratio: 1.6 (ref 1.2–2.2)
Albumin: 4.1 g/dL (ref 3.7–4.7)
Alkaline Phosphatase: 124 IU/L — ABNORMAL HIGH (ref 44–121)
BUN/Creatinine Ratio: 12 (ref 10–24)
BUN: 17 mg/dL (ref 8–27)
Bilirubin Total: 0.5 mg/dL (ref 0.0–1.2)
CO2: 22 mmol/L (ref 20–29)
Calcium: 9.2 mg/dL (ref 8.6–10.2)
Chloride: 101 mmol/L (ref 96–106)
Creatinine, Ser: 1.47 mg/dL — ABNORMAL HIGH (ref 0.76–1.27)
GFR calc Af Amer: 55 mL/min/{1.73_m2} — ABNORMAL LOW (ref >59)
GFR calc non Af Amer: 47 mL/min/{1.73_m2} — ABNORMAL LOW (ref >59)
Globulin, Total: 2.6 g/dL (ref 1.5–4.5)
Glucose: 100 mg/dL — ABNORMAL HIGH (ref 65–99)
Potassium: 5.1 mmol/L (ref 3.5–5.2)
Sodium: 140 mmol/L (ref 134–144)
Total Protein: 6.7 g/dL (ref 6.0–8.5)

## 2020-01-09 LAB — CBC WITH DIFFERENTIAL/PLATELET
Basophils Absolute: 0.1 10*3/uL (ref 0.0–0.2)
Basos: 1 %
EOS (ABSOLUTE): 0.3 10*3/uL (ref 0.0–0.4)
Eos: 3 %
Hematocrit: 45.9 % (ref 37.5–51.0)
Hemoglobin: 14.6 g/dL (ref 13.0–17.7)
Immature Grans (Abs): 0.2 10*3/uL — ABNORMAL HIGH (ref 0.0–0.1)
Immature Granulocytes: 2 %
Lymphocytes Absolute: 1.1 10*3/uL (ref 0.7–3.1)
Lymphs: 11 %
MCH: 26.2 pg — ABNORMAL LOW (ref 26.6–33.0)
MCHC: 31.8 g/dL (ref 31.5–35.7)
MCV: 82 fL (ref 79–97)
Monocytes Absolute: 1.2 10*3/uL — ABNORMAL HIGH (ref 0.1–0.9)
Monocytes: 12 %
Neutrophils Absolute: 7.2 10*3/uL — ABNORMAL HIGH (ref 1.4–7.0)
Neutrophils: 71 %
Platelets: 252 10*3/uL (ref 150–450)
RBC: 5.57 x10E6/uL (ref 4.14–5.80)
RDW: 15 % (ref 11.6–15.4)
WBC: 10.1 10*3/uL (ref 3.4–10.8)

## 2020-01-09 LAB — TSH: TSH: 2.35 u[IU]/mL (ref 0.450–4.500)

## 2020-01-09 LAB — URIC ACID: Uric Acid: 10 mg/dL — ABNORMAL HIGH (ref 3.8–8.4)

## 2020-01-09 LAB — LIPID PANEL
Chol/HDL Ratio: 2.6 ratio (ref 0.0–5.0)
Cholesterol, Total: 126 mg/dL (ref 100–199)
HDL: 48 mg/dL (ref >39)
LDL Chol Calc (NIH): 56 mg/dL (ref 0–99)
Triglycerides: 121 mg/dL (ref 0–149)
VLDL Cholesterol Cal: 22 mg/dL (ref 5–40)

## 2020-01-12 ENCOUNTER — Other Ambulatory Visit: Payer: Self-pay | Admitting: Internal Medicine

## 2020-01-12 MED ORDER — ALLOPURINOL 100 MG PO TABS: 200.0000 mg | ORAL_TABLET | Freq: Every day | ORAL | 2 refills | Status: AC

## 2020-01-12 NOTE — Progress Notes (Signed)
He needs to increase his allopurinol to 200 mg daily. Please change this in his med list and send in new prescription. Also, his cholesterol has not improved and his cardiologist is managing. Please forward results to his cardiologist.

## 2020-01-18 NOTE — Progress Notes (Signed)
He did not read his mychart message or results. Please inform him.

## 2020-01-19 ENCOUNTER — Ambulatory Visit: Payer: Medicare PPO | Admitting: Internal Medicine

## 2020-02-17 ENCOUNTER — Telehealth: Payer: Self-pay | Admitting: Internal Medicine

## 2020-02-17 NOTE — Telephone Encounter (Signed)
(  Key: [id number]) PA for repatha sureclick submitted PA Case: [id number], Status: Approved, Coverage Starts on: 01/10/2020 12:00:00 AM, Coverage Ends on: 01/08/2021 12:00:00 AM

## 2020-02-20 ENCOUNTER — Telehealth: Payer: Self-pay

## 2020-02-20 MED ORDER — ACCU-CHEK SOFTCLIX LANCETS MISC: 1 refills | Status: AC

## 2020-02-20 MED ORDER — BLOOD GLUCOSE TEST VI STRP: ORAL_STRIP | 1 refills | Status: AC

## 2020-02-20 MED ORDER — ACCU-CHEK GUIDE W/DEVICE KIT: PACK | 0 refills | Status: DC

## 2020-02-20 NOTE — Telephone Encounter (Signed)
Received fax from [facility] for a request on accu-chek guide test strips, accu-chek guide monitor system, and accu-chek softclix lancets. Pt. Last apt 01/08/20 and next ap 07/08/20

## 2020-02-20 NOTE — Telephone Encounter (Signed)
Sent in testing supplies

## 2020-02-23 ENCOUNTER — Telehealth: Payer: Self-pay

## 2020-02-23 NOTE — Telephone Encounter (Signed)
Signed and faxed back.

## 2020-02-23 NOTE — Telephone Encounter (Signed)
Received fax from [facility] for a refill on the pts. accu-chek guide l1-l2 ctrl sol and BD single use swab pt. Last apt was 01/08/20 and next apt 07/08/20.

## 2020-03-17 ENCOUNTER — Other Ambulatory Visit: Payer: Self-pay

## 2020-03-17 ENCOUNTER — Encounter: Payer: Self-pay | Admitting: Internal Medicine

## 2020-03-17 ENCOUNTER — Ambulatory Visit: Payer: Medicare PPO | Admitting: Internal Medicine

## 2020-03-17 VITALS — BP 138/78 | HR 92 | Ht 71.0 in | Wt 257.8 lb

## 2020-03-17 DIAGNOSIS — I2510 Atherosclerotic heart disease of native coronary artery without angina pectoris: Secondary | ICD-10-CM

## 2020-03-17 DIAGNOSIS — E785 Hyperlipidemia, unspecified: Secondary | ICD-10-CM

## 2020-03-17 DIAGNOSIS — I6523 Occlusion and stenosis of bilateral carotid arteries: Secondary | ICD-10-CM | POA: Diagnosis not present

## 2020-03-17 DIAGNOSIS — M79605 Pain in left leg: Secondary | ICD-10-CM

## 2020-03-17 DIAGNOSIS — M79604 Pain in right leg: Secondary | ICD-10-CM | POA: Diagnosis not present

## 2020-03-17 LAB — LIPID PANEL
Chol/HDL Ratio: 3.6 ratio (ref 0.0–5.0)
Cholesterol, Total: 174 mg/dL (ref 100–199)
HDL: 48 mg/dL (ref >39)
LDL Chol Calc (NIH): 104 mg/dL — ABNORMAL HIGH (ref 0–99)
Triglycerides: 120 mg/dL (ref 0–149)
VLDL Cholesterol Cal: 22 mg/dL (ref 5–40)

## 2020-03-17 NOTE — Progress Notes (Signed)
LIPID CLINIC CONSULT NOTE  Chief Complaint:  Follow-up dyslipidemia, leg pain  Primary Care Physician: Girtha Rm, NP-C  Primary Cardiologist:  [name], MD  HPI:  Shane [name] is a 70 y.o. male who is being seen today for the evaluation of dyslipidemia at the request of Richardson Dopp, PA-C. Mr. Farabee 70 year old male followed by Richardson Dopp.  He has a history of coronary artery disease and prior CABG x4 in 2016 as well as type 2 diabetes, dyslipidemia and carotid artery disease.  He has had longstanding dyslipidemia was previously better controlled on rosuvastatin 40 mg and ezetimibe 10 mg daily.  He was developing some symptoms of burning in his legs and discontinue the medication due to the thought it might be a myalgia.  He said that did improve somewhat, although his cholesterol accordingly increased.  His most recent labs 2 weeks ago showed total cholesterol 233, triglycerides 118, HDL 42 and LDL 170.  About a year ago his total cholesterol was 165.  He also reports of dietary changes and less activity as result of Covid.  He continues to take the ezetimibe and says he restarted the rosuvastatin when he saw a high his numbers were despite the fact that he had some side effects.  09/05/2019  [name] is seen today for follow-up.  Repeat lipids were performed showing total cholesterol now 230, triglycerides 127, HDL 42 and LDL 165 which is fairly stable with his lipids 4 and 8 months ago.  Recommended changing from high potency rosuvastatin to atorvastatin 40 mg nightly.  Unfortunately this resulted in the same side effects, namely myalgias leading to discontinuation in the medication and that is why his cholesterol is not significantly improved.  His goal LDL is less than 70 given prior CABG and since he cannot tolerate either atorvastatin or rosuvastatin at high potency doses, he is now to be considered for PCSK9 inhibitor.  03/17/2020  [name] is seen today in follow-up.   Overall he is doing well.  His cholesterols come down significantly.  Total is 126 in December, HDL 48, triglycerides 121 and LDL 56.  He is only on monotherapy Repatha at this point.  He was taking atorvastatin and I decreased the dose to see if I could reduce his myalgias.  He said they never really went away and then he stopped the medicine and finally they resolved.  He is interested in repeat lipids today. He also mentioned getting leg pain when standing, that was improved with sitting, but not necessarily worse with walking - I think this does not sound typical for claudication, rather I suspect a lumbar radiculopathy. He does have carotid disease, though.  PMHx:  Past Medical History:  Diagnosis Date  . Asthma    PMH:as a child  . Carotid stenosis    a. Carotid US 3/16:  bilat ICA 1-39% // b. Carotid US 6/18: Stable bilateral 1-39 ICA  . CIGARETTE SMOKER 11/30/2009   Qualifier: Diagnosis of  By: Loanne Drilling MD, Sean A   . CKD (chronic kidney disease) stage 3, GFR 30-59 ml/min ([place]) 10/21/2019  . Colon polyps 06/17/2011  . Coronary artery disease    a. Myoview 3/16:  Inf ischemia, EF 48%, int risk;  b.  LHC 3/16:  3 v CAD,  c.  s/p CABG x 4 03/2014  . DIABETES MELLITUS, TYPE II 09/25/2006   pt denies  . Diverticulosis 06/17/2011  . Echocardiograms    Echocardiogram 08/2018:  EF 55-60, mod  conc LVH, Gr 1 DD, normal RVSF, mild calc of AoV  . GERD (gastroesophageal reflux disease)   . Hx of cardiovascular stress test    Lexiscan Myoview 3/16:  Inferior ischemia, EF 48%, Intermediate Risk  . Hx of echocardiogram    a.  Echo 3/16:  EF 50-55%, Gr 1 DD  . HYPERCHOLESTEROLEMIA   . HYPERTENSION   . INSOMNIA 09/10/2008  . Microalbuminuria 10/21/2019  . PSA, INCREASED 11/30/2009  . RENAL INSUFFICIENCY 09/25/2006  . Tinnitus   . Wears glasses     Past Surgical History:  Procedure Laterality Date  . CATARACT EXTRACTION W/ INTRAOCULAR LENS  IMPLANT, BILATERAL    . COLONOSCOPY  06/02/2015  .  CORONARY ARTERY BYPASS GRAFT N/A 03/23/2014   Procedure: CORONARY ARTERY BYPASS GRAFTING (CABG), ON PUMP, TIMES FOUR, USING LEFT INTERNAL MAMMARY ARTERY, BILATERAL GREATER SAPHENOUS VEINS HARVESTED ENDOSCOPICALLY;  Surgeon: Ivin Poot, MD;  Location: [place];  Service: Open Heart Surgery;  Laterality: N/A;  . HERNIA REPAIR    . LEFT HEART CATHETERIZATION WITH CORONARY ANGIOGRAM N/A 03/18/2014   Procedure: LEFT HEART CATHETERIZATION WITH CORONARY ANGIOGRAM;  Surgeon: [name], MD;  [facility] C[facility] LAB;  Service: Cardiovascular;  Laterality: N/A;  . POLYPECTOMY    . TEE WITHOUT CARDIOVERSION N/A 03/23/2014   Procedure: TRANSESOPHAGEAL ECHOCARDIOGRAM (TEE);  Surgeon: Ivin Poot, MD;  Location: [place];  Service: Open Heart Surgery;  Laterality: N/A;  . TONSILLECTOMY      FAMHx:  Family History  Problem Relation Age of Onset  . Cancer Father   . Hypertension Father   . Heart disease Mother   . Heart disease Sister   . Kidney failure Sister   . Colon cancer Sister 87  . Arrhythmia Sister   . Heart attack Maternal Grandmother   . Stroke Neg Hx   . Esophageal cancer Neg Hx   . Stomach cancer Neg Hx   . Colon polyps Neg Hx     SOCHx:   reports that he quit smoking about 6 years ago. His smoking use included cigarettes. He has a 100.00 pack-year smoking history. He has never used smokeless tobacco. He reports current alcohol use. He reports that he does not use drugs.  ALLERGIES:  Allergies  Allergen Reactions  . Atorvastatin Other (See Comments)    Myalgias   . Crestor [Rosuvastatin] Other (See Comments)    Muscle pains    ROS: Pertinent items noted in HPI and remainder of comprehensive ROS otherwise negative.  HOME MEDS: Current Outpatient Medications on File Prior to Visit  Medication Sig Dispense Refill  . Accu-Chek Softclix Lancets lancets Test once a day. Pt uses accu-chek meter 100 each 1  . allopurinol (ZYLOPRIM) 100 MG tablet Take 2 tablets (200 mg total) by  mouth daily. 60 tablet 2  . Evolocumab (REPATHA SURECLICK) 818 MG/ML SOAJ Inject 1 Dose into the skin every 14 (fourteen) days. 2 mL 11  . Garlic 2993 MG CAPS Take 1,000 mg by mouth.     . Glucose Blood (BLOOD GLUCOSE TEST STRIPS) STRP Test 1-2 times a day. 100 strip 1  . hydrochlorothiazide (HYDRODIURIL) 25 MG tablet Take 1 tablet (25 mg total) by mouth daily. 90 tablet 1  . Multiple Vitamins-Minerals (MULTIVITAMIN WITH MINERALS) tablet Take 1 tablet by mouth daily.    [name] Kitchen omeprazole (PRILOSEC) 20 MG capsule Take 1 capsule (20 mg total) by mouth daily. 90 capsule 1  . aspirin 81 MG tablet Take 1 tablet (81 mg total) by  mouth daily. (Patient not taking: Reported on 03/17/2020)    . Blood Glucose Monitoring Suppl (ACCU-CHEK GUIDE) w/Device KIT Test once a day. Pt needs accu-chek meter 1 kit 0  . colchicine 0.6 MG tablet colchicine 0.6 mg tablet  TAKE 2 TABLETS BY MOUTH FIRST ONSET, THEN TAKE 1 TABLET HOUR AFTER , THEN 1 TABLET DAILY FOR 6 DAYS UNTIL FINISHED    . predniSONE (DELTASONE) 10 MG tablet prednisone 10 mg tablet  TAKE 1 TABLET BY MOUTH THREE TIMES DAILY FOR 2 DAYS 1 TWICE DAILY FOR 5 DAYS, THEN TAKE 1 TABLET DAILY UNTIL FINISHED     No current facility-administered medications on file prior to visit.    LABS/IMAGING: No results found for this or any previous visit (from the past 48 hour(s)). No results found.  LIPID PANEL:    Component Value Date/Time   CHOL 126 01/08/2020 1002   TRIG 121 01/08/2020 1002   HDL 48 01/08/2020 1002   CHOLHDL 2.6 01/08/2020 1002   CHOLHDL 3 06/11/2017 0920   VLDL 17.2 06/11/2017 0920   LDLCALC 56 01/08/2020 1002   LDLDIRECT 184.4 02/10/2013 0959    WEIGHTS: Wt Readings from Last 3 Encounters:  03/17/20 257 lb 12.8 oz (116.9 kg)  01/08/20 259 lb 9.6 oz (117.8 kg)  10/20/19 257 lb (116.6 kg)    VITALS: BP 138/78 (BP Location: Left Arm, Patient Position: Sitting)   Pulse 92   Ht '5\' 11"'  (1.803 m)   Wt 257 lb 12.8 oz (116.9 kg)   SpO2 95%    BMI 35.96 kg/m   EXAM: Deferred  EKG: Deferred  ASSESSMENT: 1. Mixed dyslipidemia, goal LDL less than 70 2. Statin intolerant-myalgias with high potency atorvastatin and rosuvastatin 3. Coronary artery disease status post CABG 4. Carotid artery disease 5. Bilateral leg pain  PLAN: 1.   Mr. Kandler has reached target LDL <70 - unfortunately he could not tolerate even low potency atorvastatin. Will continue monotherapy Repatha for now - he was advised that the evidence favors even low dose statin in addition to [facility] for risk reduction. Will plan repeat fasting lipids today. Finally, forted he does get some leg pain when standing that improves when sitting.  Its not necessarily worse with walking but he does have a history of coronary disease and carotid artery disease.  We will check lower extremity arterial Dopplers.  If negative then would recommend he follow-up with his PCP for evaluation of possible sciatic symptoms.  Follow-up with me in 1 year or sooner as necessary.  [name], MD, [facility], [place] Director of the Advanced Lipid Disorders &  Cardiovascular Risk Reduction Clinic Diplomate of the American Board of Clinical Lipidology Attending Cardiologist  Direct Dial: [phone]  Fax: [phone]  Website:  [email or web address] [email or web address] 03/17/2020, 10:46 AM

## 2020-03-17 NOTE — Patient Instructions (Signed)
Medication Instructions:  Your physician recommends that you continue on your current medications as directed. Please refer to the Current Medication list given to you today.  *If you need a refill on your cardiac medications before your next appointment, please call your pharmacy*   Lab Work: FASTING lipid panel today   If you have labs (blood work) drawn today and your tests are completely normal, you will receive your results only by: [name] Kitchen MyChart Message (if you have MyChart) OR . A paper copy in the mail If you have any lab test that is abnormal or we need to change your treatment, we will call you to review the results.   Testing/Procedures: Lower Extremity Arterial Doppler @ Dr. Lysbeth Penner office   Follow-Up: At [facility], you and your health needs are our priority.  As part of our continuing mission to provide you with exceptional heart care, we have created designated Provider Care Teams.  These Care Teams include your primary Cardiologist (physician) and Advanced Practice Providers (APPs -  Physician Assistants and Nurse Practitioners) who all work together to provide you with the care you need, when you need it.  We recommend signing up for the patient portal called "MyChart".  Sign up information is provided on this After Visit Summary.  MyChart is used to connect with patients for Virtual Visits (Telemedicine).  Patients are able to view lab/test results, encounter notes, upcoming appointments, etc.  Non-urgent messages can be sent to your provider as well.   To learn more about what you can do with MyChart, go to [email or web address].    Your next appointment:   12 month(s)  - lipid clinic   The format for your next appointment:   In Person  Provider:   K. Mali Hilty, MD   Other Instructions

## 2020-03-18 ENCOUNTER — Other Ambulatory Visit: Payer: Self-pay | Admitting: *Deleted

## 2020-03-18 DIAGNOSIS — E785 Hyperlipidemia, unspecified: Secondary | ICD-10-CM

## 2020-03-19 MED ORDER — PRAVASTATIN SODIUM 40 MG PO TABS: 40.0000 mg | ORAL_TABLET | Freq: Every evening | ORAL | 3 refills | Status: DC

## 2020-03-27 ENCOUNTER — Other Ambulatory Visit: Payer: Self-pay | Admitting: Family Medicine

## 2020-03-27 ENCOUNTER — Other Ambulatory Visit: Payer: Self-pay | Admitting: Internal Medicine

## 2020-03-29 ENCOUNTER — Other Ambulatory Visit (HOSPITAL_COMMUNITY): Payer: Self-pay | Admitting: Internal Medicine

## 2020-03-29 DIAGNOSIS — I739 Peripheral vascular disease, unspecified: Secondary | ICD-10-CM

## 2020-03-30 ENCOUNTER — Ambulatory Visit (HOSPITAL_COMMUNITY)
Admission: RE | Admit: 2020-03-30 | Discharge: 2020-03-30 | Disposition: A | Discharge status: Home / Self Care | Payer: Medicare PPO | Source: Ambulatory Visit | Attending: Cardiovascular Disease | Admitting: Cardiovascular Disease

## 2020-03-30 ENCOUNTER — Other Ambulatory Visit: Payer: Self-pay

## 2020-03-30 DIAGNOSIS — I6523 Occlusion and stenosis of bilateral carotid arteries: Secondary | ICD-10-CM | POA: Diagnosis not present

## 2020-03-30 DIAGNOSIS — M79604 Pain in right leg: Secondary | ICD-10-CM | POA: Diagnosis not present

## 2020-03-30 DIAGNOSIS — I2510 Atherosclerotic heart disease of native coronary artery without angina pectoris: Secondary | ICD-10-CM | POA: Diagnosis not present

## 2020-03-30 DIAGNOSIS — M79605 Pain in left leg: Secondary | ICD-10-CM | POA: Diagnosis not present

## 2020-03-30 DIAGNOSIS — I739 Peripheral vascular disease, unspecified: Secondary | ICD-10-CM | POA: Diagnosis not present

## 2020-04-16 ENCOUNTER — Encounter: Payer: Self-pay | Admitting: Internal Medicine

## 2020-06-13 ENCOUNTER — Other Ambulatory Visit: Payer: Self-pay | Admitting: Family Medicine

## 2020-07-08 ENCOUNTER — Encounter: Payer: Medicare PPO | Admitting: Family Medicine

## 2020-08-18 ENCOUNTER — Encounter: Payer: Self-pay | Admitting: Internal Medicine

## 2020-09-02 ENCOUNTER — Ambulatory Visit (INDEPENDENT_AMBULATORY_CARE_PROVIDER_SITE_OTHER): Payer: Medicare PPO

## 2020-09-02 ENCOUNTER — Other Ambulatory Visit: Payer: Self-pay

## 2020-09-02 DIAGNOSIS — Z87891 Personal history of nicotine dependence: Secondary | ICD-10-CM | POA: Diagnosis not present

## 2020-09-15 NOTE — Progress Notes (Signed)
Please call patient and let them  know their  low dose Ct was read as a Lung RADS 2: nodules that are benign in appearance and behavior with a very low likelihood of becoming a clinically active cancer due to size or lack of growth. Recommendation per radiology is for a repeat LDCT in 12 months. .Please let them  know we will order and schedule their  annual screening scan for 08/2021. Please let them  know there was notation of CAD on their  scan.  Please remind the patient  that this is a non-gated exam therefore degree or severity of disease  cannot be determined. Please have them  follow up with their PCP regarding potential risk factor modification, dietary therapy or pharmacologic therapy if clinically indicated. Pt.  is  currently on statin therapy. Please place order for annual  screening scan for  08/2021 and fax results to PCP. Thanks so much.  + CAD, On a statin, followed by cardiology

## 2020-09-16 ENCOUNTER — Encounter: Payer: Self-pay | Admitting: *Deleted

## 2020-09-16 DIAGNOSIS — Z87891 Personal history of nicotine dependence: Secondary | ICD-10-CM

## 2020-11-08 ENCOUNTER — Other Ambulatory Visit: Payer: Self-pay

## 2020-11-08 MED ORDER — OMEPRAZOLE 20 MG PO CPDR: 20.0000 mg | DELAYED_RELEASE_CAPSULE | Freq: Every day | ORAL | 0 refills | Status: AC

## 2020-12-20 ENCOUNTER — Telehealth: Payer: Self-pay | Admitting: Internal Medicine

## 2020-12-20 NOTE — Telephone Encounter (Signed)
Attempted repatha PA in [place] already on file for this request. Authorization starting on 01/10/2020 and ending on 01/08/2022.

## 2021-01-20 ENCOUNTER — Other Ambulatory Visit: Payer: Self-pay | Admitting: Internal Medicine

## 2021-01-20 DIAGNOSIS — E785 Hyperlipidemia, unspecified: Secondary | ICD-10-CM

## 2021-03-20 NOTE — Progress Notes (Unsigned)
Cardiology Office Note:    Date:  03/20/2021   ID:  Shane [name], DOB 1950/04/21, MRN [id number]  PCP:  No primary care provider on file.   [facility] Providers Cardiologist:  [name], MD { Click to update primary MD,subspecialty MD or APP then REFRESH:1}    Referring MD: No ref. provider found   No chief complaint on file. ***  History of Present Illness:    Shane [name] is a 71 y.o. male with a hx of CAD s/p CABG x4 2016, DM, hypertension, hyperlipidemia, carotid artery stenosis, tobacco abuse, and renal insufficiency.  He was referred to lipid clinic as he developed burning in his legs on 40 mg of Crestor.  He was started on Repatha with subsequent improvement in his lipid panel.  He was last seen by Dr. Debara Pickett 03/17/2020 and reported leg pain with standing improved with sitting.  Dr. Debara Pickett did not feel this was typical for claudication and suspected lumbar radiculopathy.  However given his CAD and carotid artery disease lower extremity ABIs completed and showed normal LE arterial Dopplers.  He was referred back to his PCP.  He was placed on my schedule for annual follow-up in lipid clinic.    Hyperlipidemia with LDL goal less than 55 Would opt for a lower LDL target given his diabetes. Intolerant to statins, now on Repatha and 40 mg Pravachol Due for repeat lipid panel   CAD s/p CABG times 04/2014 Continue aspirin Beta-blocker not listed, previously switched to carvedilol for better pressure control   Hypertension Maintained on HCTZ     Past Medical History:  Diagnosis Date   Asthma    PMH:as a child   Carotid stenosis    a. Carotid US 3/16:  bilat ICA 1-39% // b. Carotid US 6/18: Stable bilateral 1-39 ICA   CIGARETTE SMOKER 11/30/2009   Qualifier: Diagnosis of  By: Loanne Drilling MD, Hilliard Clark A    CKD (chronic kidney disease) stage 3, GFR 30-59 ml/min ([place]) 10/21/2019   Colon polyps 06/17/2011   Coronary artery disease    a. Myoview 3/16:  Inf ischemia, EF 48%,  int risk;  b.  LHC 3/16:  3 v CAD,  c.  s/p CABG x 4 03/2014   DIABETES MELLITUS, TYPE II 09/25/2006   pt denies   Diverticulosis 06/17/2011   Echocardiograms    Echocardiogram 08/2018:  EF 55-60, mod conc LVH, Gr 1 DD, normal RVSF, mild calc of AoV   GERD (gastroesophageal reflux disease)    Hx of cardiovascular stress test    Lexiscan Myoview 3/16:  Inferior ischemia, EF 48%, Intermediate Risk   Hx of echocardiogram    a.  Echo 3/16:  EF 50-55%, Gr 1 DD   HYPERCHOLESTEROLEMIA    HYPERTENSION    INSOMNIA 09/10/2008   Microalbuminuria 10/21/2019   PSA, INCREASED 11/30/2009   RENAL INSUFFICIENCY 09/25/2006   Tinnitus    Wears glasses     Past Surgical History:  Procedure Laterality Date   CATARACT EXTRACTION W/ INTRAOCULAR LENS  IMPLANT, BILATERAL     COLONOSCOPY  06/02/2015   CORONARY ARTERY BYPASS GRAFT N/A 03/23/2014   Procedure: CORONARY ARTERY BYPASS GRAFTING (CABG), ON PUMP, TIMES FOUR, USING LEFT INTERNAL MAMMARY ARTERY, BILATERAL GREATER SAPHENOUS VEINS HARVESTED ENDOSCOPICALLY;  Surgeon: Ivin Poot, MD;  Location: [place];  Service: Open Heart Surgery;  Laterality: N/A;   HERNIA REPAIR     LEFT HEART CATHETERIZATION WITH CORONARY ANGIOGRAM N/A 03/18/2014   Procedure: LEFT HEART CATHETERIZATION WITH  CORONARY ANGIOGRAM;  Surgeon: [name], MD;  Location[facility] LAB;  Service: Cardiovascular;  Laterality: N/A;   POLYPECTOMY     TEE WITHOUT CARDIOVERSION N/A 03/23/2014   Procedure: TRANSESOPHAGEAL ECHOCARDIOGRAM (TEE);  Surgeon: Ivin Poot, MD;  Location: [place];  Service: Open Heart Surgery;  Laterality: N/A;   TONSILLECTOMY      Current Medications: No outpatient medications have been marked as taking for the 03/23/21 encounter (Appointment) with [name], [place].     Allergies:   Atorvastatin and Crestor [rosuvastatin]   Social History   Socioeconomic History   Marital status: Legally Separated    Spouse name: Not on file   Number of children: Not on file    Years of education: Not on file   Highest education level: Not on file  Occupational History   Occupation: Jan[employer]  Tobacco Use   Smoking status: Former    Packs/day: 2.00    Years: 50.00    Pack years: 100.00    Types: Cigarettes    Quit date: 03/17/2014    Years since quitting: 7.0   Smokeless tobacco: Never   Tobacco comments:    none  Vaping Use   Vaping Use: Never used  Substance and Sexual Activity   Alcohol use: Yes    Comment: 8 beers per week    Drug use: No   Sexual activity: Yes    Partners: Female  Other Topics Concern   Not on file  Social History Narrative   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 There is no history of Stroke, Esophageal cancer, Stomach cancer, or Colon polyps.  ROS:   Please see the history of present illness.    *** All other systems reviewed and are negative.  EKGs/Labs/Other Studies Reviewed:    The following studies were reviewed today: ***  EKG:  EKG is *** ordered today.  The ekg ordered today demonstrates ***  Recent Labs: No results found for requested labs within last 8760 hours.  Recent Lipid Panel    Component Value Date/Time   CHOL 174 03/17/2020 1105   TRIG 120 03/17/2020 1105   HDL 48 03/17/2020 1105   CHOLHDL 3.6 03/17/2020 1105   CHOLHDL 3 06/11/2017 0920   VLDL 17.2 06/11/2017 0920   LDLCALC 104 (H) 03/17/2020 1105   LDLDIRECT 184.4 02/10/2013 0959     Risk  Assessment/Calculations:   {Does this patient have ATRIAL FIBRILLATION?:[phone]}       Physical Exam:    VS:  There were no vitals taken for this visit.    Wt Readings from Last 3 Encounters:  03/17/20 257 lb 12.8 oz (116.9 kg)  01/08/20 259 lb 9.6 oz (117.8 kg)  10/20/19 257 lb (116.6 kg)     GEN: *** Well nourished, well developed in no acute distress HEENT: Normal NECK: No JVD; No carotid bruits LYMPHATICS: No lymphadenopathy CARDIAC: ***RRR, no murmurs, rubs, gallops RESPIRATORY:  Clear to auscultation without  rales, wheezing or rhonchi  ABDOMEN: Soft, non-tender, non-distended MUSCULOSKELETAL:  No edema; No deformity  SKIN: Warm and dry NEUROLOGIC:  Alert and oriented x 3 PSYCHIATRIC:  Normal affect   ASSESSMENT:    No diagnosis found. PLAN:    In order of problems listed above:  ***      {Are you ordering a CV Procedure (e.g. stress test, cath, DCCV, TEE, etc)?   Press F2        :[id number]}    Medication Adjustments/Labs and Tests Ordered: Current medicines are reviewed at length with the patient today.  Concerns regarding medicines are outlined above.  No orders of the defined types were placed in this encounter.  No orders of the defined types were placed in this encounter.   There are no Patient Instructions on file for this visit.   Signed, [name], [place]  03/20/2021 7:48 PM    [facility]

## 2021-03-23 ENCOUNTER — Ambulatory Visit: Payer: Medicare PPO | Admitting: Physician Assistant

## 2021-03-23 ENCOUNTER — Other Ambulatory Visit: Payer: Self-pay

## 2021-03-23 ENCOUNTER — Encounter: Payer: Self-pay | Admitting: Physician Assistant

## 2021-03-23 VITALS — BP 145/80 | HR 90 | Ht 70.0 in | Wt 259.4 lb

## 2021-03-23 DIAGNOSIS — I10 Essential (primary) hypertension: Secondary | ICD-10-CM

## 2021-03-23 DIAGNOSIS — E785 Hyperlipidemia, unspecified: Secondary | ICD-10-CM

## 2021-03-23 DIAGNOSIS — I2510 Atherosclerotic heart disease of native coronary artery without angina pectoris: Secondary | ICD-10-CM

## 2021-03-23 DIAGNOSIS — R062 Wheezing: Secondary | ICD-10-CM

## 2021-03-23 DIAGNOSIS — Z951 Presence of aortocoronary bypass graft: Secondary | ICD-10-CM | POA: Diagnosis not present

## 2021-03-23 DIAGNOSIS — R052 Subacute cough: Secondary | ICD-10-CM

## 2021-03-23 MED ORDER — REPATHA SURECLICK 140 MG/ML ~~LOC~~ SOAJ: 1.0000 | SUBCUTANEOUS | 11 refills | Status: AC

## 2021-03-23 NOTE — Patient Instructions (Addendum)
Medication Instructions:  ?The current medical regimen is effective;  continue present plan and medications. ? ?*If you need a refill on your cardiac medications before your next appointment, please call your pharmacy* ? ? ?Lab Work: ?Fasting blood work ( lipids) ?If you have labs (blood work) drawn today and your tests are completely normal, you will receive your results only by: ?MyChart Message (if you have MyChart) OR ?A paper copy in the mail ?If you have any lab test that is abnormal or we need to change your treatment, we will call you to review the results. ? ? ?Follow-Up: ?At [facility], you and your health needs are our priority.  As part of our continuing mission to provide you with exceptional heart care, we have created designated Provider Care Teams.  These Care Teams include your primary Cardiologist (physician) and Advanced Practice Providers (APPs -  Physician Assistants and Nurse Practitioners) who all work together to provide you with the care you need, when you need it. ? ?We recommend signing up for the patient portal called "MyChart".  Sign up information is provided on this After Visit Summary.  MyChart is used to connect with patients for Virtual Visits (Telemedicine).  Patients are able to view lab/test results, encounter notes, upcoming appointments, etc.  Non-urgent messages can be sent to your provider as well.   ?To learn more about what you can do with MyChart, go to [email or web address].   ? ?Your next appointment:   ?Thursday 09/15/2021 at 3:35 pm ?Check in 15 minutes before appointment. ? ?The format for your next appointment:   ?In Person ? ?Provider:   ?Fabian Sharp, PA ? ? ?Other Instructions ?Call the clinic to provide your medication list. ? ?

## 2021-03-25 ENCOUNTER — Encounter: Payer: Self-pay | Admitting: Internal Medicine

## 2021-03-25 LAB — LIPID PANEL
Chol/HDL Ratio: 4.4 ratio (ref 0.0–5.0)
Cholesterol, Total: 202 mg/dL — ABNORMAL HIGH (ref 100–199)
HDL: 46 mg/dL (ref >39)
LDL Chol Calc (NIH): 138 mg/dL — ABNORMAL HIGH (ref 0–99)
Triglycerides: 101 mg/dL (ref 0–149)
VLDL Cholesterol Cal: 18 mg/dL (ref 5–40)

## 2021-03-26 ENCOUNTER — Other Ambulatory Visit: Payer: Self-pay | Admitting: Physician Assistant

## 2021-03-26 DIAGNOSIS — I10 Essential (primary) hypertension: Secondary | ICD-10-CM

## 2021-03-26 DIAGNOSIS — Z951 Presence of aortocoronary bypass graft: Secondary | ICD-10-CM

## 2021-03-26 MED ORDER — CARVEDILOL 6.25 MG PO TABS: 6.2500 mg | ORAL_TABLET | Freq: Two times a day (BID) | ORAL | 11 refills | Status: DC

## 2021-04-18 ENCOUNTER — Telehealth: Payer: Self-pay

## 2021-04-18 NOTE — Telephone Encounter (Signed)
Primary Cardiologist:Kenneth Wells Guiles, MD ? ?Chart reviewed as part of pre-operative protocol coverage. Because of Shane [name]'s past medical history and time since last visit, he/she will require a virtual visit/telephone call in order to better assess preoperative cardiovascular risk. ? ?Pre-op covering staff: ?- Please contact patient, obtain consent, and schedule appointment  ?Please schedule late May/early June ? ?This message has been routed to primary cardiologist for input on holding antiplatelet agent as requested below so that this information is available at time of patient's appointment.  ? ?Emmaline Life, NP-C ? ?  ?04/18/2021, 4:13 PM ?[place] ?[address] ?Office [phone] Fax [phone] ? ? ?

## 2021-04-18 NOTE — Telephone Encounter (Signed)
? ?  Pre-operative Risk Assessment  ?  ?Patient Name: Shane [name]  ?DOB: [birth year] ?MRN: [id number]  ? ?  ? ?Request for Surgical Clearance   ? ?Procedure:   REMOVAL/REPLACE IPP ? ?Date of Surgery:  Clearance 06/21/21                              ?   ?Surgeon:  DR. Humberto Seals ?Surgeon's Group or Practice Name:  [facility]. ?Phone number:  [phone] ?Fax number:  [phone] ?  ?Type of Clearance Requested:   ?- Medical  ?- Pharmacy:  Hold Aspirin X10 DAYS PRIOR ?  ?Type of Anesthesia:   CHOICE  ?  ?Additional requests/questions:   ? ?Signed, ?Jacinta Shoe   ?04/18/2021, 3:58 PM  ? ?

## 2021-04-18 NOTE — Telephone Encounter (Signed)
Patient with hx of CAD s/p CABG x 4 in 2016, hyperlipidemia, carotid artery stenosis. Reported leg pain with standing, improved with sitting. Lower extremity ABIs showed normal LE arterial dopplers.  ? ?He was last seen in our office by Doreene Adas, PA on 03/23/21 with complaint of left elbow numbness radiating to 4th and 5th digits. He did not know what medications he was taking. No concern for worsening angina.  ? ?Request to hold aspirin for 10 days prior to urological procedure. Dr. Debara Pickett, can patient hold aspirin as requested. Please send response to p cv div preop ? ?Shane Life, NP-C ? ?  ?04/18/2021, 4:18 PM ?[place] ?[address] ?Office [phone] Fax [phone] ? ?

## 2021-04-19 NOTE — Telephone Encounter (Addendum)
Per Dr. Sallyanne Kuster, Okay to hold aspirin for 7 days before the removal/replace IPP.  It is not necessary to hold it for 10 days. ? ? ?

## 2021-04-19 NOTE — Telephone Encounter (Signed)
Left message for the pt to call to schedule tele pre op appt.  

## 2021-04-20 ENCOUNTER — Telehealth: Payer: Self-pay | Admitting: *Deleted

## 2021-04-20 NOTE — Telephone Encounter (Signed)
Pt agreeable of care for tele pre op appt 05/23/21 @ 9 am. Med rec and consent are done. ?

## 2021-05-16 NOTE — Telephone Encounter (Signed)
NA

## 2021-05-23 ENCOUNTER — Encounter: Payer: Self-pay | Admitting: Nurse Practitioner

## 2021-05-23 ENCOUNTER — Ambulatory Visit: Payer: Medicare PPO | Admitting: Nurse Practitioner

## 2021-05-23 DIAGNOSIS — Z01810 Encounter for preprocedural cardiovascular examination: Secondary | ICD-10-CM

## 2021-05-23 NOTE — Progress Notes (Signed)
? ?Virtual Visit via Telephone Note  ? ?This visit type was conducted due to national recommendations for restrictions regarding the COVID-19 Pandemic (e.g. social distancing) in an effort to limit this patient's exposure and mitigate transmission in our community.  Due to his co-morbid illnesses, this patient is at least at moderate risk for complications without adequate follow up.  This format is felt to be most appropriate for this patient at this time.  The patient did not have access to video technology/had technical difficulties with video requiring transitioning to audio format only (telephone).  All issues noted in this document were discussed and addressed.  No physical exam could be performed with this format.  Please refer to the patient's chart for his  consent to telehealth for [facility]. ? ?Evaluation Performed:  Preoperative cardiovascular risk assessment ?_____________  ? ?Date:  05/23/2021  ? ?Patient ID:  Shane [name], Shane [name] [birth year], MRN [id number] ?Patient Location:  ?Home ?Provider location:   ?Office ? ?Primary Care Provider:  [name], MD ?Primary Cardiologist:  [name], MD ? ?Chief Complaint  ?  ?71 y.o. y/o male with a h/o CAD s/p CABG x4 in 2016, carotid artery stenosis, hypertension, hyperlipidemia, type 2 diabetes, CKD stage III, and tobacco use who is pending IPP removal/replacement with Dr. Lenoria Chime with [facility] Urology Department, and presents today for telephonic preoperative cardiovascular risk assessment. ? ?Past Medical History  ?  ?Past Medical History:  ?Diagnosis Date  ? Asthma   ? PMH:as a child  ? Carotid stenosis   ? a. Carotid US 3/16:  bilat ICA 1-39% // b. Carotid US 6/18: Stable bilateral 1-39 ICA  ? CIGARETTE SMOKER 11/30/2009  ? Qualifier: Diagnosis of  By: Loanne Drilling MD, Jacelyn Pi   ? CKD (chronic kidney disease) stage 3, GFR 30-59 ml/min ([facility]) 10/21/2019  ? Colon polyps 06/17/2011  ? Coronary artery disease   ? a. Myoview 3/16:   Inf ischemia, EF 48%, int risk;  b.  LHC 3/16:  3 v CAD,  c.  s/p CABG x 4 03/2014  ? DIABETES MELLITUS, TYPE II 09/25/2006  ? pt denies  ? Diverticulosis 06/17/2011  ? Echocardiograms   ? Echocardiogram 08/2018:  EF 55-60, mod conc LVH, Gr 1 DD, normal RVSF, mild calc of AoV  ? GERD (gastroesophageal reflux disease)   ? Hx of cardiovascular stress test   ? Lexiscan Myoview 3/16:  Inferior ischemia, EF 48%, Intermediate Risk  ? Hx of echocardiogram   ? a.  Echo 3/16:  EF 50-55%, Gr 1 DD  ? HYPERCHOLESTEROLEMIA   ? HYPERTENSION   ? INSOMNIA 09/10/2008  ? Microalbuminuria 10/21/2019  ? PSA, INCREASED 11/30/2009  ? RENAL INSUFFICIENCY 09/25/2006  ? Tinnitus   ? Wears glasses   ? ?Past Surgical History:  ?Procedure Laterality Date  ? CATARACT EXTRACTION W/ INTRAOCULAR LENS  IMPLANT, BILATERAL    ? COLONOSCOPY  06/02/2015  ? CORONARY ARTERY BYPASS GRAFT N/A 03/23/2014  ? Procedure: CORONARY ARTERY BYPASS GRAFTING (CABG), ON PUMP, TIMES FOUR, USING LEFT INTERNAL MAMMARY ARTERY, BILATERAL GREATER SAPHENOUS VEINS HARVESTED ENDOSCOPICALLY;  Surgeon: Ivin Poot, MD;  Location: [place];  Service: Open Heart Surgery;  Laterality: N/A;  ? HERNIA REPAIR    ? LEFT HEART CATHETERIZATION WITH CORONARY ANGIOGRAM N/A 03/18/2014  ? Procedure: LEFT HEART CATHETERIZATION WITH CORONARY ANGIOGRAM;  Surgeon: [name], MD;  [facility] C[facility];  Service: Cardiovascular;  Laterality: N/A;  ? POLYPECTOMY    ? TEE  WITHOUT CARDIOVERSION N/A 03/23/2014  ? Procedure: TRANSESOPHAGEAL ECHOCARDIOGRAM (TEE);  Surgeon: Ivin Poot, MD;  Location: [place];  Service: Open Heart Surgery;  Laterality: N/A;  ? TONSILLECTOMY    ? ? ?Allergies ? ?Allergies  ?Allergen Reactions  ? Atorvastatin Other (See Comments)  ?  Myalgias   ? Crestor [Rosuvastatin] Other (See Comments)  ?  Muscle pains  ? ? ?History of Present Illness  ?  ?Shane [name] is a 71 y.o. male who presents via audio/video conferencing for a telehealth visit today.  Pt was last seen in  cardiology clinic on 03/23/2021 by Fabian Sharp, [place]. At that time CORGAN MORMILE was doing well overall from a cardiac standpoint. He denied symptoms concerning for angina. The patient is now pending procedure as outlined above. Since his last visit, he has done well from a cardiac standpoint. He denies chest pain, palpitations, dyspnea, pnd, orthopnea, n, v, dizziness, syncope, edema, weight gain, or early satiety. Overall, he reports feeling well and denies any new concerns today. ? ?Home Medications  ?  ?Prior to Admission medications   ?Medication Sig Start Date End Date Taking? Authorizing Provider  ?carvedilol (COREG) 6.25 MG tablet TAKE 1 TABLET BY MOUTH TWICE DAILY ?Patient not taking: Reported on 04/20/2021 03/28/21   [name], MD  ?Accu-Chek Softclix Lancets lancets Test once a day. Pt uses accu-chek meter 02/20/20   Henson, Vickie L, NP-C  ?allopurinol (ZYLOPRIM) 100 MG tablet Take 2 tablets (200 mg total) by mouth daily. ?Patient not taking: Reported on 04/20/2021 01/12/20   Harland Dingwall L, NP-C  ?aspirin 81 MG tablet Take 1 tablet (81 mg total) by mouth daily. 04/20/14   Richardson Dopp T, PA-C  ?colchicine 0.6 MG tablet Take 1 tablet by mouth daily as needed. 02/09/21   [provider]  ?Cyanocobalamin 2500 MCG TABS Take 100 mcg by mouth daily.    [provider]  ?Evolocumab (REPATHA SURECLICK) 174 MG/ML SOAJ Inject 1 Dose into the skin every 14 (fourteen) days. 03/23/21   [name], PA  ?Garlic 0814 MG CAPS Take 1,000 mg by mouth.     [provider]  ?Glucose Blood (BLOOD GLUCOSE TEST STRIPS) STRP Test 1-2 times a day. 02/20/20   Henson, Vickie L, NP-C  ?hydrochlorothiazide (HYDRODIURIL) 25 MG tablet TAKE 1 TABLET (25 MG TOTAL) BY MOUTH DAILY. ?Patient not taking: Reported on 04/20/2021 03/29/20   [name], MD  ?Multiple Vitamins-Minerals (MULTIVITAMIN WITH MINERALS) tablet Take 1 tablet by mouth daily.    [provider]  ?omeprazole (PRILOSEC) 20 MG  capsule Take 1 capsule (20 mg total) by mouth daily. 11/08/20   Tysinger, Camelia Eng, PA-C  ?pravastatin (PRAVACHOL) 40 MG tablet Take 1 tablet (40 mg total) by mouth every evening. 03/19/20 04/20/21  Hilty, Nadean Corwin, MD  ? ? ?Physical Exam  ?  ?Vital Signs:  VARNELL ORVIS does not have vital signs available for review today. ? ?Given telephonic nature of communication, physical exam is limited. ?AAOx3. NAD. Normal affect.  Speech and respirations are unlabored. ? ?Accessory Clinical Findings  ?  ?None ? ?Assessment & Plan  ?  ?1.  Preoperative Cardiovascular Risk Assessment: ? ?According to the Revised Cardiac Risk Index (RCRI), his Perioperative Risk of Major Cardiac Event is (%): 0.9. His Functional Capacity in METs is: 7.99 according to the Duke Activity Status Index (DASI). Therefore, based on ACC/AHA guidelines, patient would be at acceptable risk for the planned procedure without further cardiovascular  testing. ? ?Per Dr. Sallyanne Kuster, patient may hold aspirin for 7 days prior to IPP removal/replacement.  Please resume aspirin as soon as possible postprocedure, at the discretion of the surgeon. ? ?A copy of this note will be routed to requesting surgeon. ? ?Time:   ?Today, I have spent 6 minutes with the patient with telehealth technology discussing medical history, symptoms, and management plan.   ? ?Lenna Sciara, NP ? ?05/23/2021, 9:10 AM ? ?

## 2021-06-07 ENCOUNTER — Other Ambulatory Visit: Payer: Self-pay | Admitting: Internal Medicine

## 2021-09-02 ENCOUNTER — Ambulatory Visit (INDEPENDENT_AMBULATORY_CARE_PROVIDER_SITE_OTHER): Payer: Medicare PPO

## 2021-09-02 DIAGNOSIS — Z87891 Personal history of nicotine dependence: Secondary | ICD-10-CM

## 2021-09-05 ENCOUNTER — Other Ambulatory Visit: Payer: Self-pay | Admitting: Acute Care

## 2021-09-05 DIAGNOSIS — Z122 Encounter for screening for malignant neoplasm of respiratory organs: Secondary | ICD-10-CM

## 2021-09-05 DIAGNOSIS — Z87891 Personal history of nicotine dependence: Secondary | ICD-10-CM

## 2021-09-14 NOTE — Progress Notes (Signed)
Cardiology Office Note:    Date:  09/15/2021   ID:  Shane [name], DOB 1950/04/04, MRN [id number]  PCP:  [name], [place] [facility] Cardiologist:  [name], MD Cardiology APP:  [name], PA { Referring MD: [name], MD   Chief Complaint  Patient presents with   Follow-up    CABG, HLD    History of Present Illness:    Shane [name] is a 71 y.o. male with a hx of CAD s/p CABG x4 2016, DM, hypertension, hyperlipidemia, carotid artery stenosis, tobacco abuse, and renal insufficiency.  He was referred to lipid clinic as he developed burning in his legs on 40 mg of Crestor.  He was started on Repatha with subsequent improvement in his lipid panel.  He was last seen by Dr. Debara Pickett 03/17/2020 and reported leg pain with standing improved with sitting.  Dr. Debara Pickett did not feel this was typical for claudication and suspected lumbar radiculopathy.  However given his CAD and carotid artery disease lower extremity ABIs completed and showed normal LE arterial Dopplers.  He was referred back to his PCP.  I saw him for routine follow up 03/23/21. He recently moved to [facility] and did not have an updated med list when I saw him. Lower extremity edema was complicated by hx of gout. BB and nefidipine were both listed on an old med list, he did not know what he was taking. He sent a [facility] message with medications he is currently taking that included 81 mg ASA and losartan ?25 mg. He reported pravastatin made him dizzy.   He was recently cleared for removal/replacement of IPP.   He presents for scheduled follow up. He again did not bring his med list. He is unsure what he is taking but is definitely taking repatha and was taking this in March at the time of his last lipid panel. He does not recall taking coreg as prescribed. He will send a mychart message with his medications. He denies angina. 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 He is here with his wife.   Past  Medical History:  Diagnosis Date   Asthma    PMH:as a child   Carotid stenosis    a. Carotid US 3/16:  bilat ICA 1-39% // b. Carotid US 6/18: Stable bilateral 1-39 ICA   CIGARETTE SMOKER 11/30/2009   Qualifier: Diagnosis of  By: Loanne Drilling MD, Hilliard Clark A    CKD (chronic kidney disease) stage 3, GFR 30-59 ml/min ([place]) 10/21/2019   Colon polyps 06/17/2011   Coronary artery disease    a. Myoview 3/16:  Inf ischemia, EF 48%, int risk;  b.  LHC 3/16:  3 v CAD,  c.  s/p CABG x 4 03/2014   DIABETES MELLITUS, TYPE II 09/25/2006   pt denies   Diverticulosis 06/17/2011   Echocardiograms    Echocardiogram 08/2018:  EF 55-60, mod conc LVH, Gr 1 DD, normal RVSF, mild calc of AoV   GERD (gastroesophageal reflux disease)    Hx of cardiovascular stress test    Lexiscan Myoview 3/16:  Inferior ischemia, EF 48%, Intermediate Risk   Hx of echocardiogram    a.  Echo 3/16:  EF 50-55%, Gr 1 DD   HYPERCHOLESTEROLEMIA    HYPERTENSION    INSOMNIA 09/10/2008   Microalbuminuria 10/21/2019   PSA, INCREASED 11/30/2009   RENAL INSUFFICIENCY 09/25/2006   Tinnitus    Wears glasses     Past Surgical History:  Procedure  Laterality Date   CATARACT EXTRACTION W/ INTRAOCULAR LENS  IMPLANT, BILATERAL     COLONOSCOPY  06/02/2015   CORONARY ARTERY BYPASS GRAFT N/A 03/23/2014   Procedure: CORONARY ARTERY BYPASS GRAFTING (CABG), ON PUMP, TIMES FOUR, USING LEFT INTERNAL MAMMARY ARTERY, BILATERAL GREATER SAPHENOUS VEINS HARVESTED ENDOSCOPICALLY;  Surgeon: Ivin Poot, MD;  Location: [place];  Service: Open Heart Surgery;  Laterality: N/A;   HERNIA REPAIR     LEFT HEART CATHETERIZATION WITH CORONARY ANGIOGRAM N/A 03/18/2014   Procedure: LEFT HEART CATHETERIZATION WITH CORONARY ANGIOGRAM;  Surgeon: [name], MD;  Location: [facility] CATH LAB;  Service: Cardiovascular;  Laterality: N/A;   POLYPECTOMY     TEE WITHOUT CARDIOVERSION N/A 03/23/2014   Procedure: TRANSESOPHAGEAL ECHOCARDIOGRAM (TEE);  Surgeon: Ivin Poot, MD;  Location: [place];  Service: Open Heart Surgery;  Laterality: N/A;   TONSILLECTOMY      Current Medications: Current Meds  Medication Sig   Accu-Chek Softclix Lancets lancets Test once a day. Pt uses accu-chek meter   albuterol (VENTOLIN HFA) 108 (90 Base) MCG/ACT inhaler INHALE 2 PUFFS BY MOUTH EVERY 6 HOURS AS NEEDED FOR WHEEZING FOR SHORTNESS OF BREATH Strength: 108 (90 Base) MCG/ACT   allopurinol (ZYLOPRIM) 100 MG tablet Take 2 tablets (200 mg total) by mouth daily.   aspirin 81 MG tablet Take 1 tablet (81 mg total) by mouth daily.   colchicine 0.6 MG tablet Take 1 tablet by mouth daily as needed.   Cyanocobalamin 2500 MCG TABS Take 100 mcg by mouth daily.   Evolocumab (REPATHA SURECLICK) 798 MG/ML SOAJ Inject 1 Dose into the skin every 14 (fourteen) days.   Garlic 9211 MG CAPS Take 1,000 mg by mouth.    Glucose Blood (BLOOD GLUCOSE TEST STRIPS) STRP Test 1-2 times a day.   Multiple Vitamins-Minerals (MULTIVITAMIN WITH MINERALS) tablet Take 1 tablet by mouth daily.   omeprazole (PRILOSEC) 20 MG capsule Take 1 capsule (20 mg total) by mouth daily.   pravastatin (PRAVACHOL) 40 MG tablet TAKE 1 TABLET BY MOUTH ONCE DAILY IN THE EVENING   [DISCONTINUED] carvedilol (COREG) 6.25 MG tablet TAKE 1 TABLET BY MOUTH TWICE DAILY   [DISCONTINUED] ezetimibe (ZETIA) 10 MG tablet Take 1 tablet (10 mg total) by mouth daily.     Allergies:   Atorvastatin and Crestor [rosuvastatin]   Social History   Socioeconomic History   Marital status: Divorced    Spouse name: Not on file   Number of children: Not on file   Years of education: Not on file   Highest education level: Not on file  Occupational History   Occupation: [employer]  Tobacco Use   Smoking status: Former    Packs/day: 2.00    Years: 50.00    Total pack years: 100.00    Types: Cigarettes    Quit date: 03/17/2014    Years since quitting: 7.5   Smokeless tobacco: Never   Tobacco comments:    none  Vaping Use   Vaping Use: Never used   Substance and Sexual Activity   Alcohol use: Yes    Comment: 8 beers per week    Drug use: No   Sexual activity: Yes    Partners: Female  Other Topics Concern   Not on file  Social History Narrative   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 There is no history of Stroke, Esophageal cancer, Stomach cancer, or Colon polyps.  ROS:   Please see the history of present illness.     All other systems reviewed and are negative.  EKGs/Labs/Other Studies Reviewed:    The following studies were reviewed today:  Echo 08/2018:  1. The left ventricle has normal systolic function, with an ejection  fraction of 55-60%. The cavity size was normal. There is moderate  concentric left ventricular hypertrophy. Left ventricular diastolic  Doppler parameters are consistent with impaired  relaxation.   2. The right ventricle has normal systolic function. The cavity was  normal. There is no increase in right ventricular wall thickness.   3. The mitral valve is grossly normal.   4. The aortic valve is tricuspid. Moderate thickening of the aortic  valve. Mild calcification of the aortic valve.   5. The aorta is normal unless otherwise noted.   6. The aortic root and ascending aorta are normal in size and structure.   7. The atrial septum is grossly normal.   EKG:  EKG is not ordered today.   Recent Labs: No results found for requested labs within last 365 days.  Recent Lipid  Panel    Component Value Date/Time   CHOL 202 (H) 03/24/2021 1514   TRIG 101 03/24/2021 1514   HDL 46 03/24/2021 1514   CHOLHDL 4.4 03/24/2021 1514   CHOLHDL 3 06/11/2017 0920   VLDL 17.2 06/11/2017 0920   LDLCALC 138 (H) 03/24/2021 1514   LDLDIRECT 184.4 02/10/2013 0959     Risk Assessment/Calculations:                Physical Exam:    VS:  BP 128/70   Pulse 92   Ht '5\' 10"'$  (1.778 m)   Wt 250 lb (113.4 kg)   SpO2 95%   BMI 35.87 kg/m     Wt Readings from Last 3 Encounters:  09/15/21 250 lb (113.4 kg)  03/23/21 259 lb 6.4 oz (117.7 kg)  03/17/20 257 lb 12.8 oz (116.9 kg)     GEN:  Well nourished, well developed in no acute distress HEENT: Normal NECK: No JVD; No carotid bruits LYMPHATICS: No lymphadenopathy CARDIAC: RRR, no murmurs, rubs, gallops RESPIRATORY:  Clear to auscultation without rales, wheezing or rhonchi  ABDOMEN: Soft, non-tender, non-distended MUSCULOSKELETAL:  No edema; No deformity  SKIN: Warm and dry NEUROLOGIC:  Alert and oriented x 3 PSYCHIATRIC:  Normal affect   ASSESSMENT:    1. Coronary artery disease involving native coronary artery of native heart without angina pectoris   2. S/P CABG x 4   3. Primary hypertension   4. Dyslipidemia, goal LDL below 70    PLAN:    In order of problems listed above:  Hyperlipidemia with LDL goal less than 55 Would opt for a lower LDL target given his diabetes. Intolerant to statins, now on Repatha and 40 mg Pravachol 03/24/2021: Cholesterol, Total 202; HDL 46; LDL Chol Calc (NIH) 138; Triglycerides 101 Not controlled Pravastatin makes him dizzy, but he is taking it While rare, can have tolerance develop to one [id number]. We  will switch to praluent with repeat lipid panel. This requires a prior authorization, C. Pavero RPH is working on. Will bring lipid panel to be taken in [place] after praluent started - timing dictated by Karren Cobble. Will also start zetia.   CAD s/p CABG times 04/2014 Continue  aspirin Beta-blocker not listed, previously switched to carvedilol for better pressure control I will prescribe coreg 6.25 mg BID again.  Stop HCTZ - he is having gout flares every 2 months. I advised low salt diet (no more fried chicken).   Hypertension On losartan 25 mg, restart coreg, stop HCTZ, low salt diet.   Follow up with Dr. Debara Pickett in 6 months and bring med list.      Medication Adjustments/Labs and Tests Ordered: Current medicines are reviewed at length with the patient today.  Concerns regarding medicines are outlined above.  Orders Placed This Encounter  Procedures   Lipid Profile   Meds ordered this encounter  Medications   DISCONTD: carvedilol (COREG) 6.25 MG tablet    Sig: Take 1 tablet (6.25 mg total) by mouth 2 (two) times daily.    Dispense:  180 tablet    Refill:  3   DISCONTD: ezetimibe (ZETIA) 10 MG tablet    Sig: Take 1 tablet (10 mg total) by mouth daily.    Dispense:  90 tablet    Refill:  3   carvedilol (COREG) 6.25 MG tablet    Sig: Take 1 tablet (6.25 mg total) by mouth 2 (two) times daily.    Dispense:  180 tablet    Refill:  3   ezetimibe (ZETIA) 10 MG tablet    Sig: Take 1 tablet (10 mg total) by mouth daily.    Dispense:  90 tablet    Refill:  3    Patient Instructions  Medication Instructions:  Your physician has recommended you make the following change in your medication:  STOP: HTCZ START: Coreg 6.25 Twice Daily START: Zetia '10mg'$  daily  *If you need a refill on your cardiac medications before your next appointment, please call your pharmacy*   Lab Work: Your physician recommends that you return for lab at your earliest convenience :  LIPIDS If you have labs (blood work) drawn today and your tests are completely normal, you will receive your results only by: [place] (if you have MyChart) OR A paper copy in the mail If you have any lab test that is abnormal or we need to change your treatment, we will call you to review  the results.   Testing/Procedures: NONE   Follow-Up: At [facility], you and your health needs are our priority.  As part of our continuing mission to provide you with exceptional heart care, we have created designated Provider Care Teams.  These Care Teams include your primary Cardiologist (physician) and Advanced Practice Providers (APPs -  Physician Assistants and Nurse Practitioners) who all work together to provide you with the care you need, when you need it.  We recommend signing up for the patient portal called "MyChart".  Sign up information is provided on this After Visit Summary.  MyChart is used to connect with patients for Virtual Visits (Telemedicine).  Patients are able to view lab/test results, encounter notes, upcoming appointments, etc.  Non-urgent messages can be sent to your provider as well.   To learn more about what you can do with MyChart, go to [email or web address].    Your next appointment:   6 month(s)  The format for your next  appointment:   In Person  Provider:   [name], MD     Other Instructions Primary Care Physician Barbie Banner on [facility] in [place].   Important Information About Sugar         Signed, [name], [place]  09/15/2021 4:36 PM    [facility]

## 2021-09-15 ENCOUNTER — Telehealth: Payer: Self-pay | Admitting: Pharmacist

## 2021-09-15 ENCOUNTER — Ambulatory Visit: Payer: Medicare PPO | Attending: Physician Assistant | Admitting: Physician Assistant

## 2021-09-15 ENCOUNTER — Encounter: Payer: Self-pay | Admitting: Physician Assistant

## 2021-09-15 VITALS — BP 128/70 | HR 92 | Ht 70.0 in | Wt 250.0 lb

## 2021-09-15 DIAGNOSIS — I2510 Atherosclerotic heart disease of native coronary artery without angina pectoris: Secondary | ICD-10-CM | POA: Diagnosis not present

## 2021-09-15 DIAGNOSIS — E785 Hyperlipidemia, unspecified: Secondary | ICD-10-CM | POA: Diagnosis not present

## 2021-09-15 DIAGNOSIS — Z951 Presence of aortocoronary bypass graft: Secondary | ICD-10-CM

## 2021-09-15 DIAGNOSIS — I10 Essential (primary) hypertension: Secondary | ICD-10-CM | POA: Diagnosis not present

## 2021-09-15 MED ORDER — CARVEDILOL 6.25 MG PO TABS: 6.2500 mg | ORAL_TABLET | Freq: Two times a day (BID) | ORAL | 3 refills | Status: AC

## 2021-09-15 MED ORDER — EZETIMIBE 10 MG PO TABS: 10.0000 mg | ORAL_TABLET | Freq: Every day | ORAL | 3 refills | Status: AC

## 2021-09-15 MED ORDER — EZETIMIBE 10 MG PO TABS: 10.0000 mg | ORAL_TABLET | Freq: Every day | ORAL | 3 refills | Status: DC

## 2021-09-15 MED ORDER — CARVEDILOL 6.25 MG PO TABS: 6.2500 mg | ORAL_TABLET | Freq: Two times a day (BID) | ORAL | 3 refills | Status: DC

## 2021-09-15 NOTE — Telephone Encounter (Signed)
PA for [place] submitted.  Key: [id number]

## 2021-09-15 NOTE — Patient Instructions (Signed)
Medication Instructions:  Your physician has recommended you make the following change in your medication:  STOP: HTCZ START: Coreg 6.25 Twice Daily START: Zetia '10mg'$  daily  *If you need a refill on your cardiac medications before your next appointment, please call your pharmacy*   Lab Work: Your physician recommends that you return for lab at your earliest convenience :  LIPIDS If you have labs (blood work) drawn today and your tests are completely normal, you will receive your results only by: [place] (if you have MyChart) OR A paper copy in the mail If you have any lab test that is abnormal or we need to change your treatment, we will call you to review the results.   Testing/Procedures: NONE   Follow-Up: At [facility], you and your health needs are our priority.  As part of our continuing mission to provide you with exceptional heart care, we have created designated Provider Care Teams.  These Care Teams include your primary Cardiologist (physician) and Advanced Practice Providers (APPs -  Physician Assistants and Nurse Practitioners) who all work together to provide you with the care you need, when you need it.  We recommend signing up for the patient portal called "MyChart".  Sign up information is provided on this After Visit Summary.  MyChart is used to connect with patients for Virtual Visits (Telemedicine).  Patients are able to view lab/test results, encounter notes, upcoming appointments, etc.  Non-urgent messages can be sent to your provider as well.   To learn more about what you can do with MyChart, go to [email or web address].    Your next appointment:   6 month(s)  The format for your next appointment:   In Person  Provider:   [name], MD     Other Instructions Primary Care Physician Barbie Banner on [facility] in [place].   Important Information About Sugar

## 2021-09-20 LAB — LIPID PANEL
Chol/HDL Ratio: 2.8 ratio (ref 0.0–5.0)
Cholesterol, Total: 136 mg/dL (ref 100–199)
HDL: 48 mg/dL (ref >39)
LDL Chol Calc (NIH): 73 mg/dL (ref 0–99)
Triglycerides: 75 mg/dL (ref 0–149)
VLDL Cholesterol Cal: 15 mg/dL (ref 5–40)

## 2022-05-30 LAB — LAB REPORT - SCANNED
Albumin, Urine POC: 96.6
Creatinine, POC: 41.5 mg/dL
EGFR: 50
Microalb Creat Ratio: 233

## 2022-05-31 ENCOUNTER — Telehealth: Payer: Self-pay | Admitting: Gastroenterology

## 2022-05-31 NOTE — Telephone Encounter (Signed)
Request received to transfer GI care from outside practice to [facility].  We appreciate the interest in our practice, however at this time due to high demand from patients without established GI providers we cannot accommodate this transfer.      

## 2022-05-31 NOTE — Telephone Encounter (Signed)
Good afternoon Dr. Russella Dar  The following patient was with [place] 3 years ago and saw [facility] in 2022. He is unhappy with their care since moving to [place] and wants to come back to [place]. Please review and advise of scheduling. Thank you

## 2022-09-04 ENCOUNTER — Ambulatory Visit: Payer: Medicare PPO

## 2022-09-04 ENCOUNTER — Ambulatory Visit (INDEPENDENT_AMBULATORY_CARE_PROVIDER_SITE_OTHER): Payer: Medicare PPO

## 2022-09-04 DIAGNOSIS — Z87891 Personal history of nicotine dependence: Secondary | ICD-10-CM | POA: Diagnosis not present

## 2022-09-04 DIAGNOSIS — Z122 Encounter for screening for malignant neoplasm of respiratory organs: Secondary | ICD-10-CM | POA: Diagnosis not present

## 2022-09-13 ENCOUNTER — Other Ambulatory Visit: Payer: Self-pay

## 2022-09-13 DIAGNOSIS — Z87891 Personal history of nicotine dependence: Secondary | ICD-10-CM

## 2022-09-13 DIAGNOSIS — Z122 Encounter for screening for malignant neoplasm of respiratory organs: Secondary | ICD-10-CM

## 2023-08-01 ENCOUNTER — Telehealth: Payer: Self-pay | Admitting: Acute Care

## 2023-08-01 NOTE — Telephone Encounter (Signed)
 Left VM to call for scheduling annual LDCT. Call [phone]
